# Patient Record
Sex: Male | Born: 1950 | Race: White | Hispanic: No | Marital: Single | Smoking: Current every day smoker
Health system: Southern US, Community
[De-identification: ages and names within clinical notes are randomized; demographics above are authoritative.]

## PROBLEM LIST (undated history)

## (undated) DIAGNOSIS — I10 Essential (primary) hypertension: Secondary | ICD-10-CM

## (undated) DIAGNOSIS — F99 Mental disorder, not otherwise specified: Secondary | ICD-10-CM

## (undated) DIAGNOSIS — I739 Peripheral vascular disease, unspecified: Secondary | ICD-10-CM

## (undated) DIAGNOSIS — Q909 Down syndrome, unspecified: Secondary | ICD-10-CM

## (undated) DIAGNOSIS — E039 Hypothyroidism, unspecified: Secondary | ICD-10-CM

## (undated) DIAGNOSIS — T783XXA Angioneurotic edema, initial encounter: Secondary | ICD-10-CM

## (undated) DIAGNOSIS — R0602 Shortness of breath: Secondary | ICD-10-CM

## (undated) HISTORY — PX: CHOLECYSTECTOMY: SHX55

---

## 2004-07-17 ENCOUNTER — Emergency Department (HOSPITAL_COMMUNITY): Admission: EM | Admit: 2004-07-17 | Discharge: 2004-07-17 | Payer: Self-pay | Admitting: Emergency Medicine

## 2004-07-20 ENCOUNTER — Inpatient Hospital Stay (HOSPITAL_COMMUNITY): Admission: EM | Admit: 2004-07-20 | Discharge: 2004-07-24 | Payer: Self-pay | Admitting: Emergency Medicine

## 2006-11-16 ENCOUNTER — Encounter: Payer: Self-pay | Admitting: Internal Medicine

## 2006-11-16 ENCOUNTER — Ambulatory Visit (HOSPITAL_COMMUNITY): Admission: RE | Admit: 2006-11-16 | Discharge: 2006-11-16 | Payer: Self-pay | Admitting: Internal Medicine

## 2008-10-16 ENCOUNTER — Inpatient Hospital Stay (HOSPITAL_COMMUNITY): Admission: EM | Admit: 2008-10-16 | Discharge: 2008-10-19 | Payer: Self-pay | Admitting: Emergency Medicine

## 2008-11-12 ENCOUNTER — Ambulatory Visit (HOSPITAL_BASED_OUTPATIENT_CLINIC_OR_DEPARTMENT_OTHER): Admission: RE | Admit: 2008-11-12 | Discharge: 2008-11-12 | Payer: Self-pay | Admitting: Urology

## 2008-11-12 ENCOUNTER — Encounter: Payer: Self-pay | Admitting: Urology

## 2009-07-30 ENCOUNTER — Encounter: Admission: RE | Admit: 2009-07-30 | Discharge: 2009-07-30 | Payer: Self-pay | Admitting: Internal Medicine

## 2009-07-31 ENCOUNTER — Encounter: Admission: RE | Admit: 2009-07-31 | Discharge: 2009-07-31 | Payer: Self-pay | Admitting: Internal Medicine

## 2009-08-07 ENCOUNTER — Inpatient Hospital Stay (HOSPITAL_COMMUNITY): Admission: AD | Admit: 2009-08-07 | Discharge: 2009-08-18 | Payer: Self-pay | Admitting: General Surgery

## 2009-08-07 ENCOUNTER — Encounter: Payer: Self-pay | Admitting: Internal Medicine

## 2009-08-07 ENCOUNTER — Ambulatory Visit: Payer: Self-pay | Admitting: Internal Medicine

## 2009-08-16 ENCOUNTER — Encounter (INDEPENDENT_AMBULATORY_CARE_PROVIDER_SITE_OTHER): Payer: Self-pay | Admitting: General Surgery

## 2009-10-17 ENCOUNTER — Encounter: Payer: Self-pay | Admitting: Internal Medicine

## 2009-10-23 ENCOUNTER — Telehealth: Payer: Self-pay | Admitting: Internal Medicine

## 2010-02-12 ENCOUNTER — Encounter: Payer: Self-pay | Admitting: Internal Medicine

## 2010-03-26 ENCOUNTER — Ambulatory Visit: Payer: Self-pay | Admitting: Internal Medicine

## 2010-03-26 DIAGNOSIS — F79 Unspecified intellectual disabilities: Secondary | ICD-10-CM | POA: Insufficient documentation

## 2010-03-26 DIAGNOSIS — D3A010 Benign carcinoid tumor of the duodenum: Secondary | ICD-10-CM

## 2010-04-06 ENCOUNTER — Telehealth (INDEPENDENT_AMBULATORY_CARE_PROVIDER_SITE_OTHER): Payer: Self-pay | Admitting: *Deleted

## 2010-04-06 ENCOUNTER — Encounter (INDEPENDENT_AMBULATORY_CARE_PROVIDER_SITE_OTHER): Payer: Self-pay | Admitting: *Deleted

## 2010-04-23 ENCOUNTER — Ambulatory Visit (HOSPITAL_COMMUNITY): Admission: RE | Admit: 2010-04-23 | Discharge: 2010-04-23 | Payer: Self-pay | Admitting: Gastroenterology

## 2010-04-23 ENCOUNTER — Ambulatory Visit: Payer: Self-pay | Admitting: Gastroenterology

## 2010-12-03 NOTE — Procedures (Signed)
Summary: Recall / [facility]  Recall / [facility]   Imported By: Lennie Odor 04/02/2010 17:10:11  _____________________________________________________________________  External Attachment:    Type:   Image     Comment:   External Document

## 2010-12-03 NOTE — Progress Notes (Signed)
Summary: EUS  Phone Note Outgoing Call Call back at [facility] Phone [phone]   Call placed by: Chales Abrahams CMA Duncan Dull),  April 06, 2010 11:11 AM Summary of Call: pt scheduled for EUS 04/23/10  reviewed  meds and instructed  pt.  Father will call if he has any questions after he recieves the information in the mail. Initial call taken by: Chales Abrahams CMA (AAMA),  April 06, 2010 11:12 AM

## 2010-12-03 NOTE — Assessment & Plan Note (Signed)
Summary: DUODENAL CARCINOID   History of Present Illness Visit Type: Initial Consult Primary GI MD: Stan Head MD [facility] Primary Provider: Lucky Cowboy, MD Requesting Provider: Lucky Cowboy, MD Chief Complaint: duodenal carcinoid History of Present Illness:   Patient unable to rely any complaints. Father provides history and there is no diarrhea or flushing. He is gaining weight as he is eating more since chlecystectomy.   GI Review of Systems      Denies abdominal pain, acid reflux, belching, bloating, chest pain, dysphagia with liquids, dysphagia with solids, heartburn, loss of appetite, nausea, vomiting, vomiting blood, weight loss, and  weight gain.        Denies anal fissure, black tarry stools, change in bowel habit, constipation, diarrhea, diverticulosis, fecal incontinence, heme positive stool, hemorrhoids, irritable bowel syndrome, jaundice, light color stool, liver problems, rectal bleeding, and  rectal pain. Preventive Screening-Counseling & Management  Alcohol-Tobacco     Smoking Status: current      Drug Use:  no.      ERCP  Procedure date:  08/07/2009  Findings:      1) Choledocholithiasis and dilated extrahepatic bile ducts. Stone removed. 2) Raised duodenal bulb lesion, with erosions. Biopsies taken. CARCINOID 3) Otherwise normal endoscopic survey, pancreatogram intentionally not obtained.  MISC. Report  Procedure date:  08/14/2009  Findings:      NUCLEAR MEDICINE OCTREOTIDE (SOMATOSTATIN-RECEPTOR) SCAN with SPECT    Technique:  Following intravenous administration of   radiopharmaceutical, whole body images of the head, neck, trunk,   and extremities were obtained on subsequent days. Whole body planar   images were obtained  at 24 and 48 hours. SPECT imaging was   performed at 48 hours.    Radiopharmaceutical: 6.1 mCi In-111 Octreotide    Comparison: CT abdomen 07/31/2009    Findings: Two small foci of  radiotracer accumulation within  the   central abdomen persist on 24 and 48 hours consistent with   neuroendocrine tumor.  The more superior focus likely corresponds   to a periportal lymph node seen on comparison CT.  The more   inferior focus is  midline to just left of midline a in the region   of the uncinate process of the pancreas and third portion duodenum.   There is no clearly enlarged  lymph node within this region.  No   convincing evidence of hepatic metastasis.    No evidence of distant metastasis within the chest or pelvis on   whole body images.    IMPRESSION:    1.  Two foci of neuroendocrine tumor within the central abdomen as   described above.   2.  No evidence of distant metastasis.   CT Abdomen/Pelvis  Procedure date:  07/31/2009  Findings:      ABD CT   1.  Cholelithiasis on ultrasound not appreciated on CT.   Gallbladder wall thickening is consistent with cholecystitis.  In   addition, dilated intrahepatic and extrahepatic bile ducts to the   level of the pancreatic head may represent common bile duct occult   choledocholithiasis or stricture.  No other intrinsic or extrinsic   obstruction lesion is seen.   2.  Probable slight reactive adenitis upper abdomen.   3.  Borderline fatty liver.   4.  Slight right gynecomastia (left breast not included for   assessment).   5.  Tiny (left greater than right) renal cortical cysts.   6.  Otherwise, negative.  PELVIC CT    1.  Increased number small  sized bilateral inguinal and external   iliac lymph nodes consistent with benign reactive adenitis.   2.  Slight degenerative changes L5-S1.   3.  Otherwise, negative.   Current Medications (verified): 1)  Atenolol 100 Mg Tabs (Atenolol) .Marland Kitchen.. 1 By Mouth Once Daily 2)  Enalapril Maleate 20 Mg Tabs (Enalapril Maleate) .Marland Kitchen.. 1 By Mouth Once Daily 3)  Lasix 40 Mg Tabs (Furosemide) .Marland Kitchen.. 1 By Mouth Once Daily 4)  Potassium Chloride Cr 10 Meq Cr-Caps (Potassium Chloride) .... Once Daily  Allergies  (verified): No Known Drug Allergies  Past History:  Past Medical History: Hypertension mental retardation exogenous obesity venous stasis dermatitis lower extremities Gout obstructive sleep apnea carcinoid tumor, benign duodenum  Past Surgical History: Cholecystectomy  Family History: No FH of Colon Cancer:  Social History: Occupation: Disabled Patient currently smokes.  Alcohol Use - no Daily Caffeine Use Illicit Drug Use - no Smoking Status:  current Drug Use:  no  Review of Systems       The patient complains of fatigue, shortness of breath, sleeping problems, and swelling of feet/legs.  The patient denies allergy/sinus, anemia, anxiety-new, arthritis/joint pain, back pain, blood in urine, breast changes/lumps, change in vision, confusion, cough, coughing up blood, depression-new, fainting, fever, headaches-new, hearing problems, heart murmur, heart rhythm changes, itching, menstrual pain, muscle pains/cramps, night sweats, nosebleeds, pregnancy symptoms, skin rash, sore throat, swollen lymph glands, thirst - excessive , urination - excessive , urination changes/pain, urine leakage, vision changes, and voice change.    Vital Signs:  Patient profile:   60 year old male Height:      70 inches Weight:      317 pounds BMI:     45.65 Pulse rate:   72 / minute Pulse rhythm:   regular BP sitting:   150 / 84  (left arm) Cuff size:   large  Vitals Entered By: June McMurray CMA Duncan Dull) (Mar 26, 2010 10:03 AM)  Physical Exam  General:  obese, NAD Eyes:  anicteric Lungs:  Clear throughout to auscultation. Heart:  Regular rate and rhythm; no murmurs, rubs,  or bruits. 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  Octreotide scan sowed duodenal lesion and probable lymph node +.  Calling this benign for now but if in lymph node may be more aggressive. He is poor operative candidate for many reasons as it appears that pancreaticoduodenectomy could be needed. He has recovered from cholecystectomy and needs reassessment of this tumor. I think EUS makes the most sense and will confer with Dr. Christella Hartigan. I explained this to the patient and his father, believe patient has limited, if any understanding of the significance.  Problem # 2:  MENTAL RETARDATION (ICD-319) Assessment: Comment Only  Patient Instructions: 1)  Please continue current medications.  2)  We will contact you and your dad about the next test, which is likely to be an endoscopic ultrasound of the upper GI tract. perfomed by Dr. Christella Hartigan.  3)  Copy sent to : Lucky Cowboy, MD and Consuello Bossier, MD 4)  The medication list was reviewed and reconciled.  All changed / newly prescribed medications were explained.  A complete medication list was provided to the patient / caregiver.  Appended Document: DUODENAL CARCINOID reviewed CT, nuc scan images.  The periportal focus on octreotide is probably the  11mm periportal LN.  should be accessable by EUS.  patty, can you arrange for upper eus, radial, +/-linear, with propofol, next available EUS/propofol time.  dx: carcinoid tumor, suspicious adenopathy.  Appended Document: DUODENAL CARCINOID be sure to speak to the father Thanks

## 2010-12-03 NOTE — Letter (Signed)
Summary: EGD Instructions  [facility]  [address] [place], [place] [address]   Phone: [phone]  Fax: [phone]       Eduardo Herman    1950-12-28    MRN: [id number]       Procedure Day /Date: 04/23/10 THURS     Arrival Time: 830 am     Procedure Time:1030 am     Location of Procedure:                     [facility] ( Outpatient Registration)    PREPARATION FOR ENDOSCOPY   On 04/23/10  THE DAY OF THE PROCEDURE:  1.   Nothing to eat or drink is allowed after midnight the night before your procedure.   MEDICATION INSTRUCTIONS  Unless otherwise instructed, you should take regular prescription medications with a small sip of water as early as possible the morning of your procedure.              OTHER INSTRUCTIONS  You will need a responsible adult at least 62 years of age to accompany you and drive you home.   This person must remain in the waiting room during your procedure.  Wear loose fitting clothing that is easily removed.  Leave jewelry and other valuables at home.  However, you may wish to bring a book to read or an iPod/MP3 player to listen to music as you wait for your procedure to start.  Remove all body piercing jewelry and leave at home.  Total time from sign-in until discharge is approximately 2-3 hours.  You should go home directly after your procedure and rest.  You can resume normal activities the day after your procedure.  The day of your procedure you should not:   Drive   Make legal decisions   Operate machinery   Drink alcohol   Return to work  You will receive specific instructions about eating, activities and medications before you leave.    The above instructions have been reviewed and explained to me by   Chales Abrahams CMA Duncan Dull)  April 06, 2010 11:10 AM     I fully understand and can verbalize these instructions over the phone mailed to home Date 04/06/10

## 2010-12-03 NOTE — Procedures (Signed)
Summary: Endoscopic Ultrasound  Patient: Eduardo Herman Note: All result statuses are Final unless otherwise noted.  Tests: (1) Endoscopic Ultrasound (EUS)  EUS Endoscopic Ultrasound                             DONE     [facility]     [address] [place], [place]  [address]           ENDOSCOPIC ULTRASOUND PROCEDURE REPORT     PATIENT:  Eduardo Herman, Eduardo Herman  MR#:  [id number]     BIRTHDATE:  01-27-1951  GENDER:  male     ENDOSCOPIST:  Rachael Fee, MD     REFERRED BY:  Iva Boop, M.D., [facility]     PROCEDURE DATE:  04/23/2010     PROCEDURE:  Upper EUS w/FNA     ASA CLASS:  Class III     INDICATIONS:  incidentally noted duodenal bulb carcinoid (during     ERCP 08/2009 Dr. Leone Payor), nuc med scan shows uptake in duodenum     as well as nearby periportal lymphnode     MEDICATIONS:   MAC sedation, administered by CRNA     DESCRIPTION OF PROCEDURE:   After the risks benefits and     alternatives of the procedure were  explained, informed consent     was obtained. The patient was then placed in the left, lateral,     decubitus postion and IV sedation was administered. Throughout the     procedure, the patient's blood pressure, pulse and oxygen     saturations were monitored continuously.  Under direct     visualization, the  endoscope was introduced through the mouth and     advanced to the duodenum.  Water was used as necessary to provide     an acoustic interface.  Upon completion of the imaging, water was     removed and the patient was sent to the recovery room in     satisfactory condition.     <<PROCEDUREIMAGES>>     Endoscopic findings:     1. Normal esopahgus     2. Normal stomach     3. Poorly visualized (using radial and linear echoendoscopes)     nodular lesion in proximal duodenal bulb that is likely the     duodenal carcinoid biopsied previously     EUS findings:     1. 2cm, irregularly shapred, heterogeneous periportal lymphnode     was clear.  This was not  malignant appearing.  The lesion was     sampled with 2 passes of a 25guage BS EUS FNA needle using color     doppler to avoid signficant blood vessels.     2. There were no other visible periportal lymphnodes.     3. Limited views of pancreas, liver, portal, splenic vessels were     all normal.     4. Gallbladder surgically absent.     Impression:     2cm, reactive appearing periportal lymphnode was sampled with EUS     FNA.  Await final cytology results.  Standard of care for small     bowel carcinoid is surgical resection, lymphnode resection (given     the potential for metastatic adenopathy even at small sizes).  He     has signficant comorbities and so clinical observation is not     unreasonable.  ______________________________     Rachael Fee, MD           cc: Lucky Cowboy, MD; Thornton Dales, MD           n.     eSIGNED:   Rachael Fee at 04/23/2010 11:47 AM           Reynolds, Fayrene Fearing, [id number]  Note: An exclamation mark (!) indicates a result that was not dispersed into the flowsheet. Document Creation Date: 04/23/2010 11:49 AM _______________________________________________________________________  (1) Order result status: Final Collection or observation date-time: 04/23/2010 11:37 Requested date-time:  Receipt date-time:  Reported date-time:  Referring Physician:   Ordering Physician: Rob Bunting [phone]) Specimen Source:  Source: Launa Grill Order Number: [id number] Lab site:   Appended Document: Endoscopic Ultrasound    EUS  Procedure date:  04/23/2010  Findings:      RESULTS:  Endoscopic findings:   1. Normal esopahgus   2. Normal stomach   3. Poorly visualized (using radial and linear echoendoscopes)   nodular lesion in proximal duodenal bulb that is likely the   duodenal carcinoid biopsied previously   EUS findings:   1. 2cm, irregularly shapred, heterogeneous periportal lymphnode   was clear. This was not malignant  appearing. The lesion was   sampled with 2 passes of a 25guage BS EUS FNA needle using color   doppler to avoid signficant blood vessels.   2. There were no other visible periportal lymphnodes.   3. Limited views of pancreas, liver, portal, splenic vessels were   all normal.   4. Gallbladder surgically absent.  ***Microscopic Examination and Diagnosis***   STATEMENT Of SPECIMEN ADEQUACY:   Satisfactory for evaluation.    INTERPRETATION(S):   FINE NEEDLE ASPIRATION, ENDOSCOPIC, PERIPORTAL LYMPH NODE:   Mixed lymphoid population.   No epithelial tumor identified.

## 2010-12-03 NOTE — Letter (Signed)
Summary: Marinus Maw MD  Marinus Maw MD   Imported By: [place] 04/03/2010 08:12:41  _____________________________________________________________________  External Attachment:    Type:   Image     Comment:   External Document

## 2011-02-04 LAB — COMPREHENSIVE METABOLIC PANEL
ALT: 102 U/L — ABNORMAL HIGH (ref 0–53)
ALT: 78 U/L — ABNORMAL HIGH (ref 0–53)
ALT: 158 U/L — ABNORMAL HIGH (ref 0–53)
ALT: 96 U/L — ABNORMAL HIGH (ref 0–53)
AST: 47 U/L — ABNORMAL HIGH (ref 0–37)
AST: 74 U/L — ABNORMAL HIGH (ref 0–37)
AST: 34 U/L (ref 0–37)
AST: 77 U/L — ABNORMAL HIGH (ref 0–37)
Albumin: 2.8 g/dL — ABNORMAL LOW (ref 3.5–5.2)
Albumin: 2.6 g/dL — ABNORMAL LOW (ref 3.5–5.2)
Albumin: 2.7 g/dL — ABNORMAL LOW (ref 3.5–5.2)
Albumin: 2.7 g/dL — ABNORMAL LOW (ref 3.5–5.2)
Albumin: 2.7 g/dL — ABNORMAL LOW (ref 3.5–5.2)
Albumin: 2.8 g/dL — ABNORMAL LOW (ref 3.5–5.2)
Alkaline Phosphatase: 208 U/L — ABNORMAL HIGH (ref 39–117)
Alkaline Phosphatase: 334 U/L — ABNORMAL HIGH (ref 39–117)
Alkaline Phosphatase: 384 U/L — ABNORMAL HIGH (ref 39–117)
Alkaline Phosphatase: 612 U/L — ABNORMAL HIGH (ref 39–117)
Alkaline Phosphatase: 635 U/L — ABNORMAL HIGH (ref 39–117)
BUN: 11 mg/dL (ref 6–23)
BUN: 14 mg/dL (ref 6–23)
BUN: 15 mg/dL (ref 6–23)
CO2: 29 mEq/L (ref 19–32)
CO2: 25 mEq/L (ref 19–32)
Calcium: 8.8 mg/dL (ref 8.4–10.5)
Calcium: 8.6 mg/dL (ref 8.4–10.5)
Chloride: 100 mEq/L (ref 96–112)
Chloride: 102 mEq/L (ref 96–112)
Chloride: 103 mEq/L (ref 96–112)
Chloride: 105 mEq/L (ref 96–112)
Chloride: 107 mEq/L (ref 96–112)
Creatinine, Ser: 1.18 mg/dL (ref 0.4–1.5)
Creatinine, Ser: 0.97 mg/dL (ref 0.4–1.5)
Creatinine, Ser: 0.97 mg/dL (ref 0.4–1.5)
GFR calc Af Amer: >60 mL/min (ref >60)
GFR calc Af Amer: >60 mL/min (ref >60)
GFR calc Af Amer: >60 mL/min (ref >60)
GFR calc non Af Amer: >60 mL/min (ref >60)
GFR calc non Af Amer: >60 mL/min (ref >60)
GFR calc non Af Amer: >60 mL/min (ref >60)
Glucose, Bld: 121 mg/dL — ABNORMAL HIGH (ref 70–99)
Glucose, Bld: 123 mg/dL — ABNORMAL HIGH (ref 70–99)
Potassium: 3.8 mEq/L (ref 3.5–5.1)
Potassium: 2.9 mEq/L — ABNORMAL LOW (ref 3.5–5.1)
Potassium: 3.3 mEq/L — ABNORMAL LOW (ref 3.5–5.1)
Potassium: 3.6 mEq/L (ref 3.5–5.1)
Potassium: 3.6 mEq/L (ref 3.5–5.1)
Sodium: 137 mEq/L (ref 135–145)
Sodium: 140 mEq/L (ref 135–145)
Sodium: 137 mEq/L (ref 135–145)
Sodium: 138 mEq/L (ref 135–145)
Total Bilirubin: 4.3 mg/dL — ABNORMAL HIGH (ref 0.3–1.2)
Total Bilirubin: 4.8 mg/dL — ABNORMAL HIGH (ref 0.3–1.2)
Total Bilirubin: 15.8 mg/dL — ABNORMAL HIGH (ref 0.3–1.2)
Total Bilirubin: 18.5 mg/dL (ref 0.3–1.2)
Total Bilirubin: 5.9 mg/dL — ABNORMAL HIGH (ref 0.3–1.2)
Total Protein: 6.9 g/dL (ref 6.0–8.3)
Total Protein: 6.7 g/dL (ref 6.0–8.3)
Total Protein: 6.8 g/dL (ref 6.0–8.3)
Total Protein: 7.2 g/dL (ref 6.0–8.3)

## 2011-02-04 LAB — CBC
HCT: 42.2 % (ref 39.0–52.0)
HCT: 42.8 % (ref 39.0–52.0)
Hemoglobin: 15 g/dL (ref 13.0–17.0)
Hemoglobin: 14.2 g/dL (ref 13.0–17.0)
Hemoglobin: 14.7 g/dL (ref 13.0–17.0)
Hemoglobin: 14.9 g/dL (ref 13.0–17.0)
MCHC: 35 g/dL (ref 30.0–36.0)
MCHC: 34.3 g/dL (ref 30.0–36.0)
MCV: 82.6 fL (ref 78.0–100.0)
MCV: 81.8 fL (ref 78.0–100.0)
MCV: 82.6 fL (ref 78.0–100.0)
Platelets: 209 10*3/uL (ref 150–400)
Platelets: 211 10*3/uL (ref 150–400)
Platelets: 221 10*3/uL (ref 150–400)
Platelets: 225 10*3/uL (ref 150–400)
RBC: 5.19 MIL/uL (ref 4.22–5.81)
RBC: 5.11 MIL/uL (ref 4.22–5.81)
RDW: 17.9 % — ABNORMAL HIGH (ref 11.5–15.5)
RDW: 18.1 % — ABNORMAL HIGH (ref 11.5–15.5)
WBC: 12.8 10*3/uL — ABNORMAL HIGH (ref 4.0–10.5)
WBC: 8.7 10*3/uL (ref 4.0–10.5)
WBC: 8.8 10*3/uL (ref 4.0–10.5)

## 2011-02-04 LAB — GLUCOSE, CAPILLARY
Glucose-Capillary: 128 mg/dL — ABNORMAL HIGH (ref 70–99)
Glucose-Capillary: 129 mg/dL — ABNORMAL HIGH (ref 70–99)
Glucose-Capillary: 100 mg/dL — ABNORMAL HIGH (ref 70–99)
Glucose-Capillary: 102 mg/dL — ABNORMAL HIGH (ref 70–99)
Glucose-Capillary: 103 mg/dL — ABNORMAL HIGH (ref 70–99)
Glucose-Capillary: 104 mg/dL — ABNORMAL HIGH (ref 70–99)
Glucose-Capillary: 108 mg/dL — ABNORMAL HIGH (ref 70–99)
Glucose-Capillary: 110 mg/dL — ABNORMAL HIGH (ref 70–99)
Glucose-Capillary: 113 mg/dL — ABNORMAL HIGH (ref 70–99)
Glucose-Capillary: 124 mg/dL — ABNORMAL HIGH (ref 70–99)
Glucose-Capillary: 133 mg/dL — ABNORMAL HIGH (ref 70–99)
Glucose-Capillary: 165 mg/dL — ABNORMAL HIGH (ref 70–99)
Glucose-Capillary: 190 mg/dL — ABNORMAL HIGH (ref 70–99)
Glucose-Capillary: 92 mg/dL (ref 70–99)
Glucose-Capillary: 99 mg/dL (ref 70–99)

## 2011-02-04 LAB — PROTIME-INR: INR: 1.04 (ref 0.00–1.49)

## 2011-02-04 LAB — POTASSIUM: Potassium: 3.3 mEq/L — ABNORMAL LOW (ref 3.5–5.1)

## 2011-02-15 LAB — POCT I-STAT 4, (NA,K, GLUC, HGB,HCT)
Glucose, Bld: 115 mg/dL — ABNORMAL HIGH (ref 70–99)
HCT: 50 % (ref 39.0–52.0)
Potassium: 3.5 mEq/L (ref 3.5–5.1)

## 2011-03-16 NOTE — Op Note (Signed)
NAME:  Eduardo, Herman NO.:  [id number]   MEDICAL RECORD NO.:  [id number]          PATIENT TYPE:  AMB   LOCATION:  [facility]                         FACILITY:  [facility]   PHYSICIAN:  Excell Seltzer. Annabell Howells, M.D.    DATE OF BIRTH:  1950-11-12   DATE OF PROCEDURE:  DATE OF DISCHARGE:                               OPERATIVE REPORT   PROCEDURE:  Right scrotal exploration with right scrotal orchiectomy and  dorsal slit.   PREOPERATIVE DIAGNOSIS:  Right hydrocele.   POSTOPERATIVE DIAGNOSIS:  Right hydrocele with severe inflammatory  changes and inflammatory mass and severe phimosis.   ANESTHESIA:  General.   SPECIMEN:  Right testicle and tunics.   ESTIMATED BLOOD LOSS:  Minimal.   COMPLICATIONS:  None.   DRAIN:  0.25 inch Penrose drain.   INDICATIONS:  [name] is a 60 year old mentally retarded white male  with longstanding right scrotal swelling and urinary incontinence.  On  ultrasound  evaluation and physical exam he was found to have a large  right hydrocele which was giving him problems with discomfort and  urinary control.  It was felt that hydrocelectomy was indicated.   PROCEDURE:  The patient is given a gram of Ancef.  He is taken operating  room where general anesthetic was induced.  He was placed in the supine  position.  His scrotum was shaved.  He was prepped with Betadine  solution.  He was draped in the usual sterile fashion.  A right anterior  oblique scrotal incision was made.  The hydrocele sac was densely  adherent to the overlying dartos but was dissected out using blunt and  Bovie dissection.  Once the hydrocele sac had been exposed. the sac was  opened using a scalpel and 450 mL of clear fluid were removed.  The sac  was opened more completely and it was found have a very thick wall and  the superior aspect of the sac was a mass effect that is likely  inflammatory but neoplasm could not be ruled out.  It was felt, based on  the findings of the sac and  the patient's general condition, that  orchiectomy would be the most appropriate approach to therapy.  Also,  once at  surgery he was found to severe phimosis and it was felt the  dorsal slitting was indicated.  I spoke to the patient's father and he  was agreeable to these additions.  Once that decision was made, the vas  was clipped and divided, the vascular pedicle was dissected out, clipped  and divided, the vas was ligated with an 0-Vicryl tie.  The pedicle was  doubly ligated using a #1 Vicryl stick tie in a 0-Vicryl free tie.  A  0.25 inch Penrose drain was placed through separate stab wound in the  inferior portion of the scrotum.  The dartos was closed with a running 3-  0 chromic, the skin closed with a running vertical mattress 3-0 chromic.  The drain was secured with a towel clip at this point.  We then turned  our attention to the penis.  A dorsal slit was performed sufficient to  allow exposure of the glans and the skin edges were then reapproximated  with a running 4-0 chromic suture.  Once the dorsal slit had been  completed, a penile block was performed with 7 cc of 0.25 percent  Marcaine without epinephrine and the incision  was injected with the remaining 3%.  Neosporin was applied to the penile  incision.  The towel clip was removed of 4x4s were applied to the  scrotal incision followed by fluff Kerlix and a scrotal support.  The  patient's anesthetic was reversed.  He was taken to recovery room in  stable condition.  There were no complications.      Excell Seltzer. Annabell Howells, M.D.  Electronically Signed     JJW/MEDQ  D:  11/12/2008  T:  11/12/2008  Job:  [id number]

## 2011-03-16 NOTE — H&P (Signed)
NAME:  Eduardo Herman, Eduardo Herman NO.:  [id number]   MEDICAL RECORD NO.:  [id number]          PATIENT TYPE:  INP   LOCATION:  4729                         FACILITY:  [facility]   PHYSICIAN:  Lucita Ferrara, MD         DATE OF BIRTH:  1951-06-03   DATE OF ADMISSION:  10/16/2008  DATE OF DISCHARGE:                              HISTORY & PHYSICAL   PRIMARY CARE PHYSICIAN:  Unassigned.   The patient is a 60 year old who presents with hypoglycemia.  The  patient has a history of mental retardation.  Was found at home to have  alterations in mental status secondary to hypoglycemia.  The patient  also has some urinary incontinence, frequency.  There are no fevers or  chills noted.  The patient denies any nausea, vomiting, focal  neurological deficits.   PAST MEDICAL HISTORY:  Significant for:  1. Hypertension.  2. Diabetes.  3. Mental retardation.  4. Hypothyroidism.  5. Sleep apnea noncompliant with CPAP.  6. History of ileus.  7. Chronic venous ulcerations, superinfection history of.   SOCIAL HISTORY:  The patient denies drugs, alcohol or tobacco.  Lives  with his parents.   ALLERGIES:  No known drug allergies.   MEDICATIONS AT HOME:  1. Atenolol.  2. Enalapril.  3. Viagra.  4. Lasix.  5. Levothyroxine.  6. Potassium.   REVIEW OF SYSTEMS:  As per HPI; otherwise, negative.   PHYSICAL EXAMINATION:  GENERAL:  She is in no acute distress.  Blood pressure is 176/79, pulse 65, respirations 18, temperature 97.9,  pulse ox 97% on room air.  HEENT:  Normocephalic, atraumatic.  Sclerae anicteric.  NECK:  Supple.  No JVD, no carotid bruits.  PERLA.  Extraocular muscles intact.  CARDIOVASCULAR:  S1, S2, regular rate and rhythm.  No murmurs, rubs or  clicks.  LUNGS:  Clear to auscultation bilaterally.  No rhonchi, rales or  wheezes.  ABDOMEN:  Soft, nontender, nondistended.  Positive bowel sounds.  NEUROLOGICAL:  Patient is alert and oriented x3.  Cranial nerves II-XII  are  grossly intact.   LABORATORY DATA:  CBC:  White count 15 hemoglobin 15.1, hematocrit 45.3,  platelets 282.  Glucose level 58.  Urinalysis shows everything to be  essentially negative.  Chest x-ray not performed.   ASSESSMENT/PLAN:  The patient is a 60 year old with:   1. Hypoglycemia.  2. Leukocytosis.  3. History of mental retardation.  4. Hypertension.   DISCUSSION/PLAN:  The patient will be admitted to the medical telemetry  unit.  Blood, urine cultures will be initiated.  For clarification will  get a portable chest x-ray.  The patient will be started on D5 1/2  normal saline.  Blood sugars will be maintained.  TSH.  Continue  levothyroxine.  Hypertension with moderate control.  Will try to obtain  better control.  Continue home medications.  DVT and GI prophylaxis  routine.  The rest of the plans are dependent on his progress.      Lucita Ferrara, MD  Electronically Signed     RR/MEDQ  D:  10/17/2008  T:  10/17/2008  Job:  [id number]

## 2011-03-16 NOTE — Discharge Summary (Signed)
NAME:  Eduardo Herman, READ NO.:  [id number]   MEDICAL RECORD NO.:  [id number]          PATIENT TYPE:  INP   LOCATION:  5511                         FACILITY:  [facility]   PHYSICIAN:  Marcellus Scott, MD     DATE OF BIRTH:  [birth year]   DATE OF ADMISSION:  10/16/2008  DATE OF DISCHARGE:  10/19/2008                               DISCHARGE SUMMARY   PRIMARY CARE DOCTOR:  Windle Guard, MD.   DISCHARGE DIAGNOSES:  1. Diabetes mellitus, type 2, with hypoglycemia.  Hypoglycemic      episodes resolved.  2. Large right hydrocele.  Outpatient followup with [facility].  3. Hypokalemia - repleted.  4. Hypertension.  5. Leukocytosis - resolved.  6. Mental retardation.  7. Hypothyroidism.  8. Sleep apnea noncompliant with continuous positive airway pressure      (CPAP).   DISCHARGE MEDICATIONS:  1. Atenolol 100 mg p.o. daily.  2. Enalapril 20 mg p.o. daily.  3. Lasix 40 mg p.o. daily.  4. Potassium chloride 20 mEq p.o. daily.  5. Levothyroxine 300 mcg p.o. on Mondays, Wednesdays, and Fridays.   MEDICATIONS HELD:  Glyburide.   PROCEDURES:  1. Scrotal ultrasound on the December 17th.  Impression:  There is a      large right hydrocele and a small left hydrocele.  Each testes is      located in a peripheral position and is likely flattened suggesting      that the hydrocele fluid may be under pressure.  No testicular mass      or ischemia or hyperemia was evident.  The epididymitis cannot be      identified on either side.  2. Chest x-ray on December 17th:  Improving atelectasis in both lung      bases.  3. Chest x-ray on December 17th:  Again, a cardiac enlargement and      central vascular congestion.  No overt pulmonary edema below lung      volumes with vascular crowding and atelectasis.   PERTINENT LABORATORY DATA:  Basic metabolic panel today remarkable for  potassium 3.4, BUN 10, creatinine 0.81.  Blood cultures are negative to  date.  CBC is with hemoglobin  14.3, hematocrit 43.2, white blood cells  7.1, platelets 247, TSH 1.483.   CONSULTATIONS:  Urology, Excell Seltzer. Annabell Howells, MD.   HOSPITAL COURSE AND PATIENT DISPOSITION:  1. [name] is a 60 year old gentleman with history of mental      retardation, type 2 diabetes, hypertension, who resides with his      parents.  The patient was found at home with altered mental status      and hypoglycemia.  He was then brought to the hospital for further      evaluation and management.  Patient was admitted to the telemetry      bed.  He was started on D5 half-normal saline.  His blood sugars      were frequently monitored.  Unfortunately, he was also given a dose      of Glyburide that he takes at home on the morning on  December 17th.      Over the next 24 hours, his blood sugars improved.  He had an      episode of hypoglycemia on the midnight of December 17th.  The      Glyburide was held.  Since then, his CBGs were checked q.4 hourly,      and his D5 normal saline had been stopped.  Over the last 24 hours,      the patient has been euglycemic with sugars ranging from 93 to 128      mg/dL off medications.  The patient at this time will be discharged      home.  He is advised not to take his oral hypoglycemic agents until      he is seen by his primary medical doctor in the next 3 to 4 days.      His parents have been advised of this and they have to call the      primary MD's office on Monday for an appointment to be seen in the      next 3 to 4 days.  2. A large right hydrocele.  The patient did not complain of any pain,      but on physical exam he was found to have markedly enlarged scrotal      swelling, which was nodular and hard but nontender and questions      likely hyperemic.  An ultrasound was done with findings as above.      Urology was consulted.  Dr. Annabell Howells kindly saw him today and      indicates that he has a symptomatic large right hydrocele without      evidence of infection, and he  recommends elective right      hydrocelectomy.  Will have the patient's family call Dr. Annabell Howells as      an outpatient for elective hydrocelectomy.  3. Hypokalemia - replete.  4. Hypertension.  Patient's blood pressure has been ranging in the      150s to 160s mainly over 80s to 90s.  This would need tighter      control as an outpatient.  5. Leukocytosis probably stress related.  He had no clinical focus of      sepsis.  He was empirically started on Avelox, which has been      discontinued.  6. Mental retardation, which is probably at baseline.  Patient      actually is eager to return home.  He was actually dressed up and      ready to go yesterday but agreed to stay an additional day after      his father spoke with him and convinced him.  Today, again, before      I went into the room, patient was already dressed and ready to go      home.   Time spent: 30 mins.      Marcellus Scott, MD  Electronically Signed     AH/MEDQ  D:  10/19/2008  T:  10/19/2008  Job:  [id number]   cc:   Excell Seltzer. Annabell Howells, M.D.  Windle Guard, M.D.

## 2011-03-16 NOTE — Consult Note (Signed)
NAME:  Eduardo Herman, Eduardo Herman NO.:  [id number]   MEDICAL RECORD NO.:  [id number]          PATIENT TYPE:  INP   LOCATION:  5511                         FACILITY:  [facility]   PHYSICIAN:  Excell Seltzer. Annabell Howells, M.D.    DATE OF BIRTH:  03-16-1951   DATE OF CONSULTATION:  10/18/2008  DATE OF DISCHARGE:                                 CONSULTATION   REFERRING PHYSICIAN:  Lucita Ferrara, MD   CHIEF COMPLAINT:  Scrotal swelling.   HISTORY:  Eduardo Herman is a 60 year old mainly handicapped white male who was  admitted on December 16 for altered mental status secondary to  hypoglycemia.  He had a leukocytosis.  We are consulted today by Dr.  Flonnie Overman for scrotal swelling which has been present for quite some time  but the patient's father states it has been worse over the past month.  He has a history of chronic incontinence.  There has been no complaints  of fever, skin breakdown or purulent drainage.   PAST HISTORY:  Significant for no drug allergies.   ADMISSION MEDICATIONS:  Enalapril, atenolol, Viagra, Lasix,  levothyroxine and potassium.   MEDICAL HISTORY:  1. Hypertension.  2. Diabetes.  3. Mental retardation.  4. Hypothyroidism.  5. Sleep apnea.  6. Prior ileus.  7. History of chronic venous stasis.   SURGICAL HISTORY:  Unremarkable.   SOCIAL HISTORY:  The patient lives at home with his parents.  He denies  drugs or alcohol.   FAMILY HISTORY:  Unremarkable.   REVIEW OF SYSTEMS:  He has had some weight loss, recent sinus congestion  with a cold.  He is otherwise currently without complaints per full  review of systems.   EXAM:  His blood pressure is 150/82, heart rate 61, temperature 97.2,  respirations 20.  GENERAL:  He is a well-developed, well-nourished obese white male in no  acute distress.  HEAD AND FACE:  Normocephalic, atraumatic.  NECK:  Supple.  LUNGS:  Bilateral breath sounds which were diminished.  HEART:  Regular rate and rhythm.  ABDOMEN:  Soft, obese, nontender  without mass, splenomegaly or hernias.  GU:  Exam reveals scrotal swelling, the penis is buried due to the large  swelling on the right which is hard but nontender.  The left testicle is  readily palpable, normal in size and consistency without mass or  tenderness.  Left epididymis is unremarkable.  The right testicle is not  palpable within the large hydrocele.  RECTAL:  Perineum reveals no fluctuance.  EXTREMITIES:  Have full range of motion with some lower extremity edema.  Neurologically has no focal deficits.   ACCESSORY CLINICAL INFORMATION:  White count 7.1, hemoglobin 14.3,  platelet count 247,000, chemistries within normal limits with a  creatinine of 0.85, sugar is 85.  Urinalysis was dip negative, blood  culture was negative.  Scrotal ultrasound reveals a large right  hydrocele with a small left hydrocele, the right testis is somewhat  small and flattened, likely due to the pressure from the hydrocele.  No  mass lesions were noted.   IMPRESSION:  Symptomatic large right  hydrocele without evidence of  infection.   RECOMMENDATIONS:  At this point the best treatment would be elective  right hydrocelectomy when he recovers from his acute illness.  I will  speak with his family regarding that.      Excell Seltzer. Annabell Howells, M.D.  Electronically Signed     JJW/MEDQ  D:  10/18/2008  T:  10/19/2008  Job:  [id number]   cc:   Lucita Ferrara, MD  Email: [email or web address]

## 2011-03-19 NOTE — H&P (Signed)
NAME:  Eduardo Herman, Eduardo Herman NO.:  [id number]   MEDICAL RECORD NO.:  [id number]          PATIENT TYPE:  EMS   LOCATION:  ED                           FACILITY:  [facility]   PHYSICIAN:  Melissa L. Ladona Ridgel, MD  DATE OF BIRTH:  01-13-1951   DATE OF ADMISSION:  07/20/2004  DATE OF DISCHARGE:                                HISTORY & PHYSICAL   CHIEF COMPLAINT:  Leg ulcer.   PRIMARY CARE PHYSICIAN:  Windle Guard, M.D.   HISTORY OF PRESENT ILLNESS:  Patient is a 60 year old white male with an  element of mental retardation, who is cared for by his father.  The patient  is unable to give history.  His father has given the below information.  Patient denies any pain at this time but has been seen in the past couple of  weeks in the emergency room for lower extremity cellulitis and evaluated for  venous stasis.  Patient was placed in Unna boots and was returned to his  primary caregiver for followup.  He returns today for purulent drainage and  a frankly opened wound to his right lower extremity.   Review of systems is unable to be adequately obtained, as the patient is  unable to answer the questions.  His father, however, states that he has not  had any fevers or chills, he has just been a little bit ornery, as he has  not been able to go to work.   PAST MEDICAL HISTORY:  1.  Hypertension.  2.  Hypothyroidism.  3.  Probable sleep apnea, for which the patient has refused to wear a CPAP.   PAST SURGICAL HISTORY:  None.   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  He does smoke.  He does not drink.   FAMILY HISTORY:  His father is living and has hypertension and cholesterol.  Mom is deceased secondary to stroke and complicating post-stroke factors.   ALLERGIES:  No known drug allergies.   MEDICATIONS:  1.  Lisinopril 10 mg q.d.  2.  Potassium 20 mg 2 tablets q.d.  3.  Levothyroxine 150 mcg p.o. q.d.  4.   Triamterene/hydrochlorothiazide combination 37.5/25 mg p.o. q.d.  5.  Atenolol 100 mg p.o. q.d.   PHYSICAL EXAMINATION:  VITAL SIGNS:  Temperature 98.2, blood pressure  118/81, pulse 84, respirations 20, 91%.  GENERAL:  He is in no acute distress.  He rouses but only with aggressive  stimulation.  This is likely secondary to some voluntary behaviors on the  patient's behalf secondary to his mental incapacity.  He will answer some  questions but is unable to answer other questions.  HEENT:  Normocephalic and atraumatic.  Pupils are equal, round and reactive  to light.  Extraocular muscles are intact.  Mucous membranes are moist.  He  has poor oral hygiene.  NECK:  Supple.  There is no JVD or lymph nodes.  LUNGS:  Clear to auscultation.  There are no rales, rhonchi or wheezes.  Breath sounds are decreased bilaterally.  CARDIOVASCULAR:  Regular  rate and rhythm.  Positive S1 and S2.  No S3 or S4  are noted.  Heart sounds are very distant.  There are no murmurs, rubs or  gallops.  ABDOMEN:  Soft, nontender, nondistended with positive bowel sounds.  He is  very obese.  EXTREMITIES:  Show right shin ulceration measuring about 3 x 5 inches with  obvious necrosis and pus.  He has +1 pulses.  NEUROLOGIC:  The patient is very simple.  He is unable to give the year or  the month but can give his city.  He cycles between speaking to you and has  some shy behavior.  Power appears to be 5/5.  Deep tendon reflexes are 2.   Laboratories reveal a white count of 11.1, hemoglobin 15.6, hematocrit 45.9,  and platelets of 279.  His sodium was 140, KCL 3.4, chloride 106, CO2 25,  BUN 22, creatinine 1.4.  His glucose is 151.   ASSESSMENT/PLAN:  This is a 60 year old white male with an element of mental  handicap who presents with skin breakdown of the right leg after an Unna  boot was placed to treat venous stasis.  1.  Right leg ulceration:  Will use wet-to-dry dressings until the wound      care nurse  has seen the patient.  I have started him on vancomycin and      Zosyn.  Will follow up with the wound cultures taken in the ED.  I am      recommending Doppler ultrasounds in the lower extremities to rule out      deep venous thrombosis and will check AVIs in the vascular lab.  If      necessary, we will obtain a vascular consult  2.  Cardiovascular:  Patient is hypertensive.  We will continue his      atenolol, lisinopril, and diuretic.  3.  Pulmonary:  The patient likely has a history of sleep apnea, per his      dad; however, the patient refuses to wear a CPAP.  He does not use home      oxygen.  4.  Gastrointestinal:  The patient is obese.  There are no other obvious      issues.  5.  Genitourinary:  The patient smells of urine; therefore, we will check a      UA C&S and assist him with his ADLs.  6.  Neurologic:  The patient is mentally handicapped and will need      assistance with his ADLs.  We will attempt to place the patient near the      nurses' station to assure that he is safe.  7.  We will check a TSH and hemoglobin A1C to rule out possible occult      diabetes.       MLT/MEDQ  D:  07/20/2004  T:  07/20/2004  Job:  [id number]   cc:   Windle Guard, M.D.  [address]  [place], [place] [address]  Fax: [phone]

## 2011-03-19 NOTE — Discharge Summary (Signed)
NAME:  Eduardo Herman, Eduardo Herman NO.:  [id number]   MEDICAL RECORD NO.:  [id number]          PATIENT TYPE:  INP   LOCATION:  0455                         [facility]AC[facility]   PHYSICIAN:  Corinna L. Lendell Caprice, MDDATE OF BIRTH:  January 09, 1951   DATE OF ADMISSION:  07/20/2004  DATE OF DISCHARGE:  07/24/2004                                 DISCHARGE SUMMARY   DIAGNOSES:  1.  Venous stasis ulcer with bacterial super-infection.  2.  Hypertension.  3.  Hypothyroidism, need repeat TSH.  4.  Mental retardation.  5.  Sleep apnea, noncompliant with CPAP.  6.  Resolved ileus.   DISCHARGE MEDICATIONS:  1.  Augmentin liquid 800 mg p.o. b.i.d. x 5 more days.  2.  He is to continue his other outpatient medications which include:      Lisinopril 10 mg p.o. daily.  3.  Potassium 20 mEq p.o. daily.  4.  Synthroid 150 mcg p.o. daily.  5.  Triamterene/hydrochlorothiazide 37.5/25 mg p.o. daily.  6.  Atenolol 100 mg p.o. daily.   FOLLOW UP:  With Dr. Jeannetta Nap in a week.   ACTIVITY:  He is to keep his legs elevated.  Neomia Dear boot has been placed on the  right leg, and this should continue until healing of the ulcer.  It can be  changed every week.  Home health has been arranged for assistance with this.  His left leg, he should take a compression stocking, keep his legs elevated.   CONDITION AT DISCHARGE:  Stable.   PROCEDURES:  PICC line.   CONSULTS:  Wound care.   PERTINENT LABORATORY DATA:  Wound culture grew out Staph aureus which was  sensitive to everything except for penicillin.  Blood cultures negative.  Hemoglobin A1c 6.1.  TSH 8.794.  White blood cell count on admission was  11,000 with 90% neutrophils, 4% lymphocytes.  The rest of his CBC was  unremarkable.  Complete metabolic panel was significant for an albumin of  2.5, potassium 3.4, otherwise unremarkable.  UA negative.  Pertinent  laboratories:  EKG showed normal sinus rhythm with prolonged Q/T interval.   HISTORY AND HOSPITAL  COURSE:  Mr. Hollibaugh is a 60 year old, mentally retarded,  white male, who had been getting Una boots for venous stasis.  Apparently  upon dressing change, he was noted to have ulceration of the  anterior right  leg with purulent drainage.  He had normal vital signs upon presentation,  appeared nontoxic.  There was purulent drainage and superficial ulceration  of the right leg. Pulses were intact.  He also had changes consistent with  venous stasis dermatitis.  He also had some surrounding cellulitis.  The  patient was started on broad-spectrum antibiotics which were eventually  narrowed.  A PICC line was placed, as patient had poor IV access.  He  apparently pulled this out on his own, and he was switched to Augmentin.  The wound care nurse saw the wound and made recommendations.  At this point,  he is afebrile, and his leg is looking better.  He has still some pustules  and  should continue with his Augmentin; however, most of the purulence and  cellulitis has disappeared.   Also during his hospitalization, he developed some high-pitched bowel  sounds, and he was not eating.  He was not tender, and could not provide any  history; however, abdominal films did in fact show air fluid levels.  This  resolved on its own and the time of discharge, the patient was having bowel  movements and was eating well.  Also, he was noted to be hyperglycemic, and  this should be followed up as an outpatient.  His hyperglycemia was fairly  mild, and I have asked that he maintain a low concentrated sweet diet.  His  potassium was repleted.     Cori   CLS/MEDQ  D:  07/24/2004  T:  07/25/2004  Job:  [id number]   cc:   Windle Guard, M.D.  [address]  [place], [place] [address]  Fax: [phone]

## 2011-05-24 ENCOUNTER — Inpatient Hospital Stay (HOSPITAL_COMMUNITY)
Admission: EM | Admit: 2011-05-24 | Discharge: 2011-06-04 | DRG: 603 | Disposition: A | Discharge status: Home Health | Payer: Medicare Other | Attending: Internal Medicine | Admitting: Internal Medicine

## 2011-05-24 DIAGNOSIS — Z6841 Body Mass Index (BMI) 40.0 and over, adult: Secondary | ICD-10-CM

## 2011-05-24 DIAGNOSIS — E039 Hypothyroidism, unspecified: Secondary | ICD-10-CM | POA: Diagnosis present

## 2011-05-24 DIAGNOSIS — E119 Type 2 diabetes mellitus without complications: Secondary | ICD-10-CM | POA: Diagnosis present

## 2011-05-24 DIAGNOSIS — L02419 Cutaneous abscess of limb, unspecified: Principal | ICD-10-CM | POA: Diagnosis present

## 2011-05-24 DIAGNOSIS — N183 Chronic kidney disease, stage 3 unspecified: Secondary | ICD-10-CM | POA: Diagnosis present

## 2011-05-24 DIAGNOSIS — T368X5A Adverse effect of other systemic antibiotics, initial encounter: Secondary | ICD-10-CM | POA: Diagnosis not present

## 2011-05-24 DIAGNOSIS — Z79899 Other long term (current) drug therapy: Secondary | ICD-10-CM

## 2011-05-24 DIAGNOSIS — E876 Hypokalemia: Secondary | ICD-10-CM | POA: Diagnosis not present

## 2011-05-24 DIAGNOSIS — N179 Acute kidney failure, unspecified: Secondary | ICD-10-CM | POA: Diagnosis not present

## 2011-05-24 DIAGNOSIS — D3A010 Benign carcinoid tumor of the duodenum: Secondary | ICD-10-CM | POA: Diagnosis present

## 2011-05-24 DIAGNOSIS — F79 Unspecified intellectual disabilities: Secondary | ICD-10-CM | POA: Diagnosis present

## 2011-05-24 DIAGNOSIS — I129 Hypertensive chronic kidney disease with stage 1 through stage 4 chronic kidney disease, or unspecified chronic kidney disease: Secondary | ICD-10-CM | POA: Diagnosis present

## 2011-05-24 DIAGNOSIS — K560 Paralytic ileus: Secondary | ICD-10-CM | POA: Diagnosis not present

## 2011-05-24 DIAGNOSIS — G4733 Obstructive sleep apnea (adult) (pediatric): Secondary | ICD-10-CM | POA: Diagnosis present

## 2011-05-24 DIAGNOSIS — I872 Venous insufficiency (chronic) (peripheral): Secondary | ICD-10-CM | POA: Diagnosis present

## 2011-05-24 LAB — CBC
MCH: 28.9 pg (ref 26.0–34.0)
MCHC: 32.6 g/dL (ref 30.0–36.0)
MCV: 88.6 fL (ref 78.0–100.0)
Platelets: 231 10*3/uL (ref 150–400)
RBC: 5.09 MIL/uL (ref 4.22–5.81)
RDW: 14.7 % (ref 11.5–15.5)

## 2011-05-24 LAB — DIFFERENTIAL
Basophils Absolute: 0 10*3/uL (ref 0.0–0.1)
Basophils Relative: 0 % (ref 0–1)
Eosinophils Absolute: 0.2 10*3/uL (ref 0.0–0.7)
Lymphocytes Relative: 9 % — ABNORMAL LOW (ref 12–46)
Lymphs Abs: 1.6 10*3/uL (ref 0.7–4.0)
Monocytes Absolute: 0.7 10*3/uL (ref 0.1–1.0)
Neutro Abs: 15.5 10*3/uL — ABNORMAL HIGH (ref 1.7–7.7)

## 2011-05-24 LAB — BASIC METABOLIC PANEL
BUN: 20 mg/dL (ref 6–23)
CO2: 27 mEq/L (ref 19–32)
Calcium: 8.8 mg/dL (ref 8.4–10.5)
Creatinine, Ser: 0.82 mg/dL (ref 0.50–1.35)
GFR calc non Af Amer: >60 mL/min (ref >60)
Glucose, Bld: 140 mg/dL — ABNORMAL HIGH (ref 70–99)
Sodium: 141 mEq/L (ref 135–145)

## 2011-05-25 LAB — COMPREHENSIVE METABOLIC PANEL
AST: 9 U/L (ref 0–37)
Albumin: 2.6 g/dL — ABNORMAL LOW (ref 3.5–5.2)
Alkaline Phosphatase: 99 U/L (ref 39–117)
BUN: 17 mg/dL (ref 6–23)
Chloride: 107 mEq/L (ref 96–112)
Creatinine, Ser: 0.81 mg/dL (ref 0.50–1.35)
Potassium: 2.8 mEq/L — ABNORMAL LOW (ref 3.5–5.1)
Total Protein: 6.9 g/dL (ref 6.0–8.3)

## 2011-05-25 LAB — CBC
HCT: 42.8 % (ref 39.0–52.0)
MCHC: 32.2 g/dL (ref 30.0–36.0)
Platelets: 212 10*3/uL (ref 150–400)
RDW: 14.6 % (ref 11.5–15.5)
WBC: 17.8 10*3/uL — ABNORMAL HIGH (ref 4.0–10.5)

## 2011-05-25 LAB — GLUCOSE, CAPILLARY: Glucose-Capillary: 187 mg/dL — ABNORMAL HIGH (ref 70–99)

## 2011-05-25 LAB — TSH: TSH: 0.37 u[IU]/mL (ref 0.350–4.500)

## 2011-05-25 LAB — MAGNESIUM: Magnesium: 2.1 mg/dL (ref 1.5–2.5)

## 2011-05-25 LAB — HEMOGLOBIN A1C: Hgb A1c MFr Bld: 5.9 % — ABNORMAL HIGH (ref <5.7)

## 2011-05-25 NOTE — H&P (Unsigned)
NAME:  Eduardo Herman, Eduardo Herman NO.:  [id number]  MEDICAL RECORD NO.:  [id number]  LOCATION:  WLED                         FACILITY:  [facility]  PHYSICIAN:  Eduardo Herman, MDDATE OF BIRTH:  10-Sep-1951  DATE OF ADMISSION:  05/24/2011 DATE OF DISCHARGE:                             HISTORY & PHYSICAL   CHIEF COMPLAINT:  Swelling, pain, and redness of the lower extremities.  HISTORY OF PRESENT ILLNESS:  Eduardo Herman is a 60 year old Caucasian gentleman presented to the emergency department with complaints of swelling, pain, and erythema of the lower extremities, especially the right leg that is worse with movement.  Apparently, he was treated for several days with prednisone and Z-PAK for, stasis dermatitis, cellulitis, but the treatment was ineffective and symptoms progressed to the point the patient had to present to the emergency department today for an evaluation.  PAST MEDICAL HISTORY:  Significant for hypertension, hypothyroidism, mental regurgitation, obstructive sleep apnea.  The patient is noncompliant with CPAP, carcinoid tumor of the duodenum, exogenous obesity, and he is status post cholecystectomy.  SOCIAL HISTORY:  He is not smoking.  Does not drink alcohol.  Does not use illicit drugs.  He lives with his family and his father is his primary caretaker.  FAMILY HISTORY:  Noncontributory to this admission.  ALLERGIES:  No known drug allergies.  HOME MEDICATIONS:  The patient takes atenolol, enalapril Lasix, levothyroxine, diltiazem, and potassium chloride.  Doses are unknown and to be verified by pharmacy.  REVIEW OF SYSTEMS:  It is hard to obtain the full review of systems because the patient is not really conversant.  He just answers yes or no to simple questions and his speech is unclear.  However, he denied significant pain at the present time and refused any pain medications that was offered.  He denied any chest pain, shortness of breath. Denied  any nausea, constipation, or diarrhea.  CODE STATUS:  Unable to discuss code status with the patient secondary to his limited ability to understand complex issues due to mental exacerbation and code status would need to be addressed with the family.  PHYSICAL EXAMINATION:  VITAL SIGNS:  Blood pressure 155/82, pulse 92, temperature 98.9, respirations 16, pulse oximetry 95%. HEENT:  Normocephalic and atraumatic.  Extraocular movements intact. Sclerae anicteric.  Mucous membranes intact, moist. NECK:  Without JVD or carotid bruits. LUNGS:  Clear to auscultation bilaterally without wheezes, rhonchi, or rales. HEART:  Regular rate and rhythm.  Normal S1 and S2 without significant murmurs, gallops, or rubs. ABDOMEN:  Distended, very obese with positive bowel sounds x4.  Unable to palpate any masses or organomegaly. EXTREMITIES:  Lower extremities with 3+ pretibial and foot edema and bilateral skin changes compatible with stasis dermatitis right lower extremity has erythema extending to the mid thigh, warm to touch. Anterior surface of the shin has a weeping lesion draining yellowish discharge.  Dorsalis pedis pulses palpable bilaterally 1+. NEURO:  The patient is alert and oriented to self and place.  LABORATORY DATA:  His white blood cell count was 18, hemoglobin 14.7, hematocrit 45.1, platelet count 231.  Sodium 141, potassium 3.9, chloride 105, CO2 of 27, BUN 20, creatinine 0.82, glucose  140, calcium 8.8.  IMPRESSION: 1. Cellulitis of the right lower extremity - we will start the patient     on IV vancomycin and IV Zosyn per pharmacy protocol, also provide     pain relief with Tylenol or oxycodone as needed. 2. Venous stasis dermatitis with large lower extremity swelling.  He     might benefit from the IV Lasix therapy. 3. Hyperthyroidism.  We will recheck TSH and continue levothyroxine.     We will make dose adjustments as needed. 4. Hypertension.  Continue home meds. 5.  Hyperglycemia.  We will check hemoglobin A1c and put him on a     sliding scale if needed. 6. Deep venous thrombosis prophylaxis will be achieved with Lovenox. 7. Code status.  Needs to be addressed with family due to the patient     being mentally retarded.     Eduardo Herman, P.A.   ______________________________ Eduardo Cowboy, MD    MK/MEDQ  D:  05/25/2011  T:  05/25/2011  Job:  [id number]  cc:   Eduardo Massed, MD Fax: [phone]

## 2011-05-26 LAB — GLUCOSE, CAPILLARY
Glucose-Capillary: 137 mg/dL — ABNORMAL HIGH (ref 70–99)
Glucose-Capillary: 148 mg/dL — ABNORMAL HIGH (ref 70–99)
Glucose-Capillary: 164 mg/dL — ABNORMAL HIGH (ref 70–99)

## 2011-05-26 LAB — CBC
HCT: 43 % (ref 39.0–52.0)
Hemoglobin: 13.9 g/dL (ref 13.0–17.0)
MCH: 28.7 pg (ref 26.0–34.0)
MCHC: 32.3 g/dL (ref 30.0–36.0)
MCV: 88.7 fL (ref 78.0–100.0)
RBC: 4.85 MIL/uL (ref 4.22–5.81)

## 2011-05-26 LAB — DIFFERENTIAL
Lymphs Abs: 1.6 10*3/uL (ref 0.7–4.0)
Monocytes Absolute: 0.8 10*3/uL (ref 0.1–1.0)
Monocytes Relative: 6 % (ref 3–12)
Neutro Abs: 9.9 10*3/uL — ABNORMAL HIGH (ref 1.7–7.7)
Neutrophils Relative %: 77 % (ref 43–77)

## 2011-05-26 LAB — BASIC METABOLIC PANEL
BUN: 15 mg/dL (ref 6–23)
CO2: 25 mEq/L (ref 19–32)
Calcium: 8.6 mg/dL (ref 8.4–10.5)
Creatinine, Ser: 0.9 mg/dL (ref 0.50–1.35)
Glucose, Bld: 141 mg/dL — ABNORMAL HIGH (ref 70–99)

## 2011-05-27 ENCOUNTER — Inpatient Hospital Stay (HOSPITAL_COMMUNITY): Payer: Medicare Other

## 2011-05-27 LAB — BASIC METABOLIC PANEL
CO2: 26 mEq/L (ref 19–32)
Calcium: 8.7 mg/dL (ref 8.4–10.5)
Chloride: 103 mEq/L (ref 96–112)
Creatinine, Ser: 0.82 mg/dL (ref 0.50–1.35)
Glucose, Bld: 145 mg/dL — ABNORMAL HIGH (ref 70–99)
Sodium: 139 mEq/L (ref 135–145)

## 2011-05-27 LAB — GLUCOSE, CAPILLARY
Glucose-Capillary: 149 mg/dL — ABNORMAL HIGH (ref 70–99)
Glucose-Capillary: 192 mg/dL — ABNORMAL HIGH (ref 70–99)
Glucose-Capillary: 152 mg/dL — ABNORMAL HIGH (ref 70–99)

## 2011-05-27 LAB — WOUND CULTURE

## 2011-05-27 LAB — CBC
HCT: 45.9 % (ref 39.0–52.0)
Hemoglobin: 15 g/dL (ref 13.0–17.0)
MCH: 28.7 pg (ref 26.0–34.0)
MCHC: 32.7 g/dL (ref 30.0–36.0)

## 2011-05-27 LAB — DIFFERENTIAL
Basophils Relative: 0 % (ref 0–1)
Eosinophils Absolute: 0.6 10*3/uL (ref 0.0–0.7)
Monocytes Relative: 9 % (ref 3–12)
Neutrophils Relative %: 76 % (ref 43–77)

## 2011-05-27 MED ORDER — IOHEXOL 300 MG/ML  SOLN
100.0000 mL | Freq: Once | INTRAMUSCULAR | Status: AC | PRN
  Administered 2011-05-27: 100 mL via INTRAVENOUS

## 2011-05-28 LAB — GLUCOSE, CAPILLARY
Glucose-Capillary: 149 mg/dL — ABNORMAL HIGH (ref 70–99)
Glucose-Capillary: 136 mg/dL — ABNORMAL HIGH (ref 70–99)
Glucose-Capillary: 127 mg/dL — ABNORMAL HIGH (ref 70–99)

## 2011-05-28 LAB — CBC
HCT: 44.8 % (ref 39.0–52.0)
MCH: 29.2 pg (ref 26.0–34.0)
MCHC: 33.3 g/dL (ref 30.0–36.0)
MCV: 87.7 fL (ref 78.0–100.0)
Platelets: 256 10*3/uL (ref 150–400)
RDW: 14.6 % (ref 11.5–15.5)
WBC: 12.5 10*3/uL — ABNORMAL HIGH (ref 4.0–10.5)

## 2011-05-28 LAB — DIFFERENTIAL
Eosinophils Absolute: 0.2 10*3/uL (ref 0.0–0.7)
Eosinophils Relative: 2 % (ref 0–5)
Lymphs Abs: 1.1 10*3/uL (ref 0.7–4.0)
Monocytes Absolute: 1.3 10*3/uL — ABNORMAL HIGH (ref 0.1–1.0)

## 2011-05-28 LAB — BASIC METABOLIC PANEL
BUN: 20 mg/dL (ref 6–23)
Calcium: 8.4 mg/dL (ref 8.4–10.5)
Chloride: 107 mEq/L (ref 96–112)
Creatinine, Ser: 1.32 mg/dL (ref 0.50–1.35)
GFR calc Af Amer: >60 mL/min (ref >60)
GFR calc non Af Amer: 55 mL/min — ABNORMAL LOW (ref >60)

## 2011-05-28 LAB — MAGNESIUM: Magnesium: 2.4 mg/dL (ref 1.5–2.5)

## 2011-05-28 LAB — VANCOMYCIN, RANDOM: Vancomycin Rm: 24.6 ug/mL

## 2011-05-29 ENCOUNTER — Inpatient Hospital Stay (HOSPITAL_COMMUNITY): Payer: Medicare Other

## 2011-05-29 LAB — GLUCOSE, CAPILLARY
Glucose-Capillary: 123 mg/dL — ABNORMAL HIGH (ref 70–99)
Glucose-Capillary: 129 mg/dL — ABNORMAL HIGH (ref 70–99)

## 2011-05-29 LAB — CREATININE, SERUM
GFR calc Af Amer: >60 mL/min (ref >60)
GFR calc non Af Amer: >60 mL/min (ref >60)

## 2011-05-29 LAB — POTASSIUM: Potassium: 3 mEq/L — ABNORMAL LOW (ref 3.5–5.1)

## 2011-05-29 LAB — VANCOMYCIN, RANDOM: Vancomycin Rm: 11.7 ug/mL

## 2011-05-30 ENCOUNTER — Inpatient Hospital Stay (HOSPITAL_COMMUNITY): Payer: Medicare Other

## 2011-05-30 LAB — BASIC METABOLIC PANEL WITH GFR
BUN: 25 mg/dL — ABNORMAL HIGH (ref 6–23)
CO2: 27 meq/L (ref 19–32)
Calcium: 8.4 mg/dL (ref 8.4–10.5)
Chloride: 110 meq/L (ref 96–112)
Creatinine, Ser: 1.1 mg/dL (ref 0.50–1.35)
GFR calc Af Amer: >60 mL/min
GFR calc non Af Amer: >60 mL/min
Glucose, Bld: 110 mg/dL — ABNORMAL HIGH (ref 70–99)
Potassium: 2.7 meq/L — CL (ref 3.5–5.1)
Sodium: 147 meq/L — ABNORMAL HIGH (ref 135–145)

## 2011-05-30 LAB — DIFFERENTIAL
Basophils Absolute: 0.1 K/uL (ref 0.0–0.1)
Basophils Relative: 1 % (ref 0–1)
Eosinophils Absolute: 0.7 K/uL (ref 0.0–0.7)
Eosinophils Relative: 5 % (ref 0–5)
Lymphocytes Relative: 12 % (ref 12–46)
Lymphs Abs: 1.6 K/uL (ref 0.7–4.0)
Monocytes Absolute: 1.1 K/uL — ABNORMAL HIGH (ref 0.1–1.0)
Monocytes Relative: 9 % (ref 3–12)
Neutro Abs: 9.4 K/uL — ABNORMAL HIGH (ref 1.7–7.7)
Neutrophils Relative %: 73 % (ref 43–77)

## 2011-05-30 LAB — CBC
HCT: 45.1 % (ref 39.0–52.0)
MCH: 28.7 pg (ref 26.0–34.0)
MCHC: 32.4 g/dL (ref 30.0–36.0)
RDW: 14.5 % (ref 11.5–15.5)

## 2011-05-30 LAB — GLUCOSE, CAPILLARY
Glucose-Capillary: 113 mg/dL — ABNORMAL HIGH (ref 70–99)
Glucose-Capillary: 122 mg/dL — ABNORMAL HIGH (ref 70–99)

## 2011-05-31 ENCOUNTER — Inpatient Hospital Stay (HOSPITAL_COMMUNITY): Payer: Medicare Other

## 2011-05-31 DIAGNOSIS — K560 Paralytic ileus: Secondary | ICD-10-CM

## 2011-05-31 DIAGNOSIS — D3A010 Benign carcinoid tumor of the duodenum: Secondary | ICD-10-CM

## 2011-05-31 LAB — COMPREHENSIVE METABOLIC PANEL
AST: 35 U/L (ref 0–37)
Albumin: 2.4 g/dL — ABNORMAL LOW (ref 3.5–5.2)
Alkaline Phosphatase: 93 U/L (ref 39–117)
Chloride: 111 mEq/L (ref 96–112)
Potassium: 3 mEq/L — ABNORMAL LOW (ref 3.5–5.1)
Total Bilirubin: 0.9 mg/dL (ref 0.3–1.2)
Total Protein: 6.9 g/dL (ref 6.0–8.3)

## 2011-05-31 LAB — CULTURE, BLOOD (ROUTINE X 2): Culture  Setup Time: 201207240938

## 2011-05-31 LAB — CBC
MCHC: 32 g/dL (ref 30.0–36.0)
Platelets: 243 10*3/uL (ref 150–400)
RDW: 14.7 % (ref 11.5–15.5)
WBC: 13 10*3/uL — ABNORMAL HIGH (ref 4.0–10.5)

## 2011-05-31 LAB — CLOSTRIDIUM DIFFICILE BY PCR: Toxigenic C. Difficile by PCR: NEGATIVE

## 2011-05-31 LAB — DIFFERENTIAL
Basophils Absolute: 0.1 10*3/uL (ref 0.0–0.1)
Eosinophils Absolute: 0.5 10*3/uL (ref 0.0–0.7)
Monocytes Absolute: 0.9 10*3/uL (ref 0.1–1.0)
Neutrophils Relative %: 73 % (ref 43–77)

## 2011-05-31 LAB — GLUCOSE, CAPILLARY
Glucose-Capillary: 100 mg/dL — ABNORMAL HIGH (ref 70–99)
Glucose-Capillary: 113 mg/dL — ABNORMAL HIGH (ref 70–99)
Glucose-Capillary: 120 mg/dL — ABNORMAL HIGH (ref 70–99)

## 2011-06-01 ENCOUNTER — Inpatient Hospital Stay (HOSPITAL_COMMUNITY): Payer: Medicare Other

## 2011-06-01 LAB — GLUCOSE, CAPILLARY
Glucose-Capillary: 151 mg/dL — ABNORMAL HIGH (ref 70–99)
Glucose-Capillary: 107 mg/dL — ABNORMAL HIGH (ref 70–99)
Glucose-Capillary: 109 mg/dL — ABNORMAL HIGH (ref 70–99)

## 2011-06-01 LAB — URINE MICROSCOPIC-ADD ON

## 2011-06-01 LAB — CBC
HCT: 44.8 % (ref 39.0–52.0)
Hemoglobin: 14.7 g/dL (ref 13.0–17.0)
MCV: 87 fL (ref 78.0–100.0)
RBC: 5.15 MIL/uL (ref 4.22–5.81)
WBC: 18.1 10*3/uL — ABNORMAL HIGH (ref 4.0–10.5)

## 2011-06-01 LAB — URINALYSIS, ROUTINE W REFLEX MICROSCOPIC
Bilirubin Urine: NEGATIVE
Glucose, UA: NEGATIVE mg/dL
Ketones, ur: 15 mg/dL — AB
Nitrite: NEGATIVE
Protein, ur: 30 mg/dL — AB

## 2011-06-01 LAB — BASIC METABOLIC PANEL
BUN: 29 mg/dL — ABNORMAL HIGH (ref 6–23)
CO2: 20 mEq/L (ref 19–32)
Chloride: 107 mEq/L (ref 96–112)
Creatinine, Ser: 2.95 mg/dL — ABNORMAL HIGH (ref 0.50–1.35)

## 2011-06-01 LAB — DIFFERENTIAL
Basophils Relative: 1 % (ref 0–1)
Lymphocytes Relative: 9 % — ABNORMAL LOW (ref 12–46)
Monocytes Relative: 7 % (ref 3–12)
Neutro Abs: 14.6 10*3/uL — ABNORMAL HIGH (ref 1.7–7.7)

## 2011-06-02 DIAGNOSIS — K560 Paralytic ileus: Secondary | ICD-10-CM

## 2011-06-02 LAB — BASIC METABOLIC PANEL
BUN: 32 mg/dL — ABNORMAL HIGH (ref 6–23)
Calcium: 8.2 mg/dL — ABNORMAL LOW (ref 8.4–10.5)
Creatinine, Ser: 3.53 mg/dL — ABNORMAL HIGH (ref 0.50–1.35)
GFR calc Af Amer: 21 mL/min — ABNORMAL LOW (ref >60)
GFR calc non Af Amer: 18 mL/min — ABNORMAL LOW (ref >60)
Glucose, Bld: 106 mg/dL — ABNORMAL HIGH (ref 70–99)

## 2011-06-02 LAB — URINALYSIS, MICROSCOPIC ONLY
Bilirubin Urine: NEGATIVE
Glucose, UA: NEGATIVE mg/dL
Specific Gravity, Urine: 1.011 (ref 1.005–1.030)
Urobilinogen, UA: 0.2 mg/dL (ref 0.0–1.0)

## 2011-06-02 LAB — CBC
HCT: 42 % (ref 39.0–52.0)
Hemoglobin: 13.5 g/dL (ref 13.0–17.0)
MCH: 28.2 pg (ref 26.0–34.0)
MCHC: 32.1 g/dL (ref 30.0–36.0)
MCV: 87.9 fL (ref 78.0–100.0)
RDW: 15.2 % (ref 11.5–15.5)

## 2011-06-02 LAB — DIFFERENTIAL
Basophils Relative: 1 % (ref 0–1)
Eosinophils Absolute: 1.2 10*3/uL — ABNORMAL HIGH (ref 0.0–0.7)
Lymphs Abs: 1.8 10*3/uL (ref 0.7–4.0)
Monocytes Absolute: 1.2 10*3/uL — ABNORMAL HIGH (ref 0.1–1.0)
Neutro Abs: 13.1 10*3/uL — ABNORMAL HIGH (ref 1.7–7.7)
Neutrophils Relative %: 75 % (ref 43–77)

## 2011-06-02 LAB — VANCOMYCIN, TROUGH: Vancomycin Tr: 28.8 ug/mL (ref 10.0–20.0)

## 2011-06-02 LAB — URINE CULTURE: Culture: NO GROWTH

## 2011-06-02 LAB — GLUCOSE, CAPILLARY
Glucose-Capillary: 109 mg/dL — ABNORMAL HIGH (ref 70–99)
Glucose-Capillary: 138 mg/dL — ABNORMAL HIGH (ref 70–99)

## 2011-06-03 LAB — BASIC METABOLIC PANEL
CO2: 19 mEq/L (ref 19–32)
Chloride: 109 mEq/L (ref 96–112)
Creatinine, Ser: 2.98 mg/dL — ABNORMAL HIGH (ref 0.50–1.35)
GFR calc Af Amer: 26 mL/min — ABNORMAL LOW (ref >60)
Potassium: 3 mEq/L — ABNORMAL LOW (ref 3.5–5.1)
Sodium: 141 mEq/L (ref 135–145)

## 2011-06-03 LAB — GLUCOSE, CAPILLARY
Glucose-Capillary: 108 mg/dL — ABNORMAL HIGH (ref 70–99)
Glucose-Capillary: 109 mg/dL — ABNORMAL HIGH (ref 70–99)
Glucose-Capillary: 110 mg/dL — ABNORMAL HIGH (ref 70–99)
Glucose-Capillary: 122 mg/dL — ABNORMAL HIGH (ref 70–99)
Glucose-Capillary: 126 mg/dL — ABNORMAL HIGH (ref 70–99)

## 2011-06-03 LAB — CBC
MCV: 89.2 fL (ref 78.0–100.0)
Platelets: 223 10*3/uL (ref 150–400)
RBC: 4.55 MIL/uL (ref 4.22–5.81)
RDW: 15.4 % (ref 11.5–15.5)
WBC: 12.6 10*3/uL — ABNORMAL HIGH (ref 4.0–10.5)

## 2011-06-03 LAB — URINE CULTURE: Culture: NO GROWTH

## 2011-06-03 NOTE — Consult Note (Signed)
Eduardo Herman, Eduardo Herman NO.:  [id number]  MEDICAL RECORD NO.:  [id number]  LOCATION:  1529                         FACILITY:  [facility]  PHYSICIAN:  Jordan Hawks. Elnoria Howard, MD    DATE OF BIRTH:  October 25, 1951  DATE OF CONSULTATION:  05/29/2011 DATE OF DISCHARGE:                                CONSULTATION   GI Consultation  REFERRING PHYSICIAN:  Altha Harm, M.D.  PRIMARY GASTROENTEROLOGIST:  Iva Boop, M.D., [facility].  HISTORY OF PRESENT ILLNESS:  This is a 60 year old gentleman, who is admitted to the hospital with a cellulitis.  He had complained swelling, pain, redness and lower extremity edema and subsequently is identified to have a cellulitis.  During this hospitalization, he was progressing well with OT and also antibiotic treatment, although he had acute distention of his abdomen and a KUB was performed and showed that he had a marked distention of his small and large bowels and subsequently a CT scan was performed, which was reported as having no evidence of any obstruction hence most likely consistent with adynamic ileus; however, there is a tapering in the sigmoid colon, but no obvious mention of any obstruction at that time.  NG tube did help to decompress the small bowel, but his colon still remains markedly distended.  As a result of his symptoms, a GI consultation was requested for further evaluation and treatment.  PAST MEDICAL HISTORY AND PAST SURGICAL HISTORY:  Significant for: 1. Hypertension. 2. Hypothyroidism. 3. Mental retardation. 4. Obstructive sleep apnea. 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 6. Status post cholecystectomy. 7. Morbid obesity.  SOCIAL HISTORY:  Negative for alcohol, tobacco or illicit drug use. Patient lives with his father.  FAMILY HISTORY:  Noncontributory.  ALLERGIES:  No known drug allergies.  REVIEW OF SYSTEMS:  Unable to obtain.  MEDICATIONS: 1. Lovenox 70 mg subcu daily. 2. Lasix 40 mg IV daily. 3.  Metoprolol 10 mg IV q.6h. 4. Zosyn 3.375 grams IV q.8h. 5. Vancomycin 1500 mg IV daily. 6. Hydralazine 10 mg IV q.6h. p.r.n.  ALLERGIES:  No known drug allergies.  PHYSICAL EXAMINATION:  VITAL SIGNS:  Blood pressure is 152/89, heart rate is 67, respirations 20, temperature is 97.7. GENERAL:  The patient is in no acute distress, alert. HEENT:  Normocephalic, atraumatic.  Extraocular muscles intact. NECK:  Supple.  No lymphadenopathy. LUNGS:  Clear to auscultation bilaterally. CARDIOVASCULAR:  Regular rate and rhythm. ABDOMEN:  Morbidly obese, soft, but is distended and tympanic.  Some bowel sounds. EXTREMITIES:  Positive for edema and cellulitis.  LABORATORY DATA:  Laboratory evaluations, white blood cell count is 12.5, hemoglobin 14.9, MCV is 87.7, platelets at 256,000.  Sodium is 142, potassium 2.6, the chloride is 107, CO2 of 27, glucose 145, BUN 20, creatinine is 1.3.  IMPRESSION:  Ileus versus obstruction:  I will review the computed tomography scan with the radiologist as there was mention of a tapering in the sigmoid region, but again the official report states that there was no evidence of any obstruction.  If the patient does have any type of obstruction or narrowing within the sigmoid colon, it maybe beneficial for him to undergo a decompression.  If it is adynamic ileus, certainly, I would continue to encourage the physical activity with occupational therapy and his potassium needs to be corrected as it is markedly abnormal at 2.5.  this electrolyte imbalance can result in an ileus.  At this time, after my discussion with radiology, I will discern if he needs a barium enema in order to further identify an obstructive etiology.     Jordan Hawks Elnoria Howard, MD     PDH/MEDQ  D:  05/29/2011  T:  05/29/2011  Job:  [id number]  cc:   Iva Boop, MD,FACG [facility] [address] [place], [place] [address]  Electronically Signed by Jeani Hawking MD on 06/03/2011  11:22:57 AM

## 2011-06-04 LAB — BASIC METABOLIC PANEL
BUN: 21 mg/dL (ref 6–23)
CO2: 20 mEq/L (ref 19–32)
Calcium: 8.7 mg/dL (ref 8.4–10.5)
Glucose, Bld: 105 mg/dL — ABNORMAL HIGH (ref 70–99)
Potassium: 3.3 mEq/L — ABNORMAL LOW (ref 3.5–5.1)
Sodium: 144 mEq/L (ref 135–145)

## 2011-06-04 LAB — GLUCOSE, CAPILLARY
Glucose-Capillary: 107 mg/dL — ABNORMAL HIGH (ref 70–99)
Glucose-Capillary: 112 mg/dL — ABNORMAL HIGH (ref 70–99)

## 2011-06-05 NOTE — Discharge Summary (Signed)
NAME:  Eduardo Herman, Eduardo Herman NO.:  [id number]  MEDICAL RECORD NO.:  [id number]  LOCATION:  1529                         FACILITY:  [facility]  PHYSICIAN:  Marinda Elk, M.D.DATE OF BIRTH:  Sep 27, 1951  DATE OF ADMISSION:  05/24/2011 DATE OF DISCHARGE:  06/04/2011                              DISCHARGE SUMMARY   PRIMARY CARE DOCTOR:  Dr. Lilian Kapur  DISCHARGE DIAGNOSES: 1. Cellulitis of the right lower extremity. 2. Acute kidney injury secondary to vancomycin. 3. Bilateral lower extremity stasis. 4. Adynamic ileus. 5. Hyperkalemia. 6. Obesity. 7. Leukocytosis.  IMAGING: 1. Portable chest x-ray showed right arm in PICC and AV junction. 2. Abdominal x-ray showed some interval decrease in degree of small     bowel dilation with persistent small bowel diffuse distention     suggest adynamic ileus, no free air. 3. Abdominal x-ray persistent small bowel distention with     decompression. 4. Abdominal x-ray showed stable to progressive bowel colonic ileus     pattern. 5. Abdominal x-ray May 29 2011, showed interval placement of     nasogastric tube with decompression of dilated small loops,     persistent distention of colon. 6. CT scan of the abdomen and pelvis showed small and colonic     distention without evidence of focal obstruction or mass suggested     adynamic ileus. 7. Abdominal x-ray May 27, 2011 showed considerable gaseous     distention of the large and small bowel.  MEDICATIONS:  Potassium 20 mEq 2 tablets daily.  BRIEF ADMITTING HISTORY AND PHYSICAL:  This is a 60 year old male who presents to the emergency room with complaints of swelling, pain, and erythema in his lower extremity; specially in the right leg that is worse with movement.  Apparently, he was treated several days with prednisone and Z-Pak for stasis dermatitis and cellulitis, but this was ineffective and symptoms progressively worse to the point where he presents to the emergency  room for further evaluation, so we are asked to admit him further evaluate.  Please refer to dictation from May 23, 2011, for further details.  ASSESSMENT/PLAN:  Please refer to dictation on June 01, 2011, for further details by Dr. Ashley Royalty. 1. Cellulitis of his right lower extremity.  The patient was admitted,     was started on IV vancomycin.  The patient has now received 7-day     course of IV vancomycin and Zosyn and appearance of his lower     extremity has improved considerably and resolved.  The patient was      also seen by wound care and received local wound care with medication,     Cultures have so far been negative and revealed no pathogens, and      he completed his course in the hospital. 2. Acute kidney injury probably secondary to vancomycin and probably     the Lasix.  These were held.  He was given IV fluids.  His     creatinine improved and continues to improve.  His baseline at 0.8     and it peak at 3.5; on the day of discharge is 2.5.  He will follow  up with his primary care physician who will get a BMET to follow     up.  He will also stop his enalapril until he sees his primary care     doctor and will resume his Lasix in 2 days. 3. Bilateral lower extremity stasis dermatitis.  The patient has     chronic dermatitis and would likely benefit from compression hose     at his lower extremity; however, apparently he does not like to     wear them as per his family and that is unfortunate for this     patient and his chronic dermatitis probably will benefit from this. 4. Adynamic ileus.  Approximately 3 days into the hospitalization, he     was noted to have some distended abdomen and having discomfort.  We     will get abdominal x-ray was done that showed results above.  NG     tube was placed.  His abdomen got decompressed but he pulled his NG     out.  GI recommended not putting the NG back in, start him on a     liquid clear diet and Reglan which he did.  He  was tolerating his     diet well.  His IV Reglan had to be stopped secondary to diarrhea     and also his stool softener has to be stopped secondary to     diarrhea.  Now, this has resolved. 5. Hypokalemia.  The patient has been profoundly hyperkalemic.  He was     given potassium supplements which he will continue at home on day     of discharge 3.5.  He will continue his home dose of potassium 20     mEq 2 tablets and follow up with his primary care doctor as an     outpatient. 6. Hypothyroidism.  Continue home meds, no changes were made. 7. Chronic carcinoma.  This is a well-known, however, he did not     appear to have any involvement in the patient's clinical settings. 8. Leukocytosis.  It is probably secondary to acute kidney injury.  He     remained afebrile.  His antibiotics were stopped.  Once his     creatinine started improving, his white count came down. 9. Hypertension.  Currently, his enalapril and Lasix were stopped     secondary to acute kidney injury.  His Lasix was resumed.  This     will be started by his primary care doctor as an outpatient.  On     day of discharge, his blood pressure was 160/81.  LABORATORY DATA:  On day of discharge shows mag of 2.2, his sodium was 144, potassium 3.3, chloride 111, bicarb of 20, glucose 105, BUN of 21, creatinine 2.5, and calcium of 8.7.  VITALS ON DAY OF DISCHARGE:  VITAL SIGNS:  Temperature of 98, pulse 77, respirations 18, and blood pressure 160/81.  He was saturating 96% on room air.  DISPOSITION:  He will follow up with his primary care doctor next week. We will check a BMET and start his enalapril if his creatinine is back to baseline.  We will see if he is doing on his blood pressure and titrate blood pressure medications as needed pain.  He meets with the BMET at the next followup.     Marinda Elk, M.D.     AF/MEDQ  D:  06/04/2011  T:  06/05/2011  Job:  [id number]  cc:   Dr. Lilian Kapur  Electronically  Signed by Marinda Elk M.D. on 06/05/2011 08:09:35 PM

## 2011-06-15 NOTE — Progress Notes (Signed)
NAME:  Eduardo Herman, Eduardo Herman NO.:  [id number]  MEDICAL RECORD NO.:  [id number]  LOCATION:  1529                         FACILITY[facility]  [facility]  PHYSICIAN:  Altha Harm, MDDATE OF BIRTH:  [birth year]                                PROGRESS NOTE   DATE OF DISCHARGE: Not yet determined.  DIAGNOSES AT THE TIME OF THIS DICTATION: 1. Cellulitis of the right lower extremity. 2. Bilateral lower extremity stasis dermatitis. 3. Adynamic ileus. 4. Hypokalemia. 5. Obesity. 6. Obstructive sleep apnea. 7. Mental retardation. 8. History of carcinoid tumor of the duodenum.  DISCHARGE MEDICATIONS: To be determined at the time of discharge by discharging physician.  PRIMARY CARE PHYSICIAN: Jeoffrey Massed, MD  PRIMARY GASTROENTEROLOGIST: Iva Boop, MD, [facility]  PRIMARY SURGEON: Anselm Pancoast. Zachery Dakins, M.D.  PROCEDURES: PICC line placement.  DIAGNOSTIC STUDIES: 1. Portable abdominal x-ray which shows considerable gaseous     distention of the large and small bowel.  Ileus versus partial     colonic obstruction. 2. CT of the abdomen and pelvis done on the 26th which shows small     bowel and colonic distention without evidence of focal obstruction     or mass, suggesting adynamic ileus.     a.     Borderline retroperitoneal and pelvic adenopathy as before.     b.     Coronary and aortoiliac calcifications. 3. One-view abdominal x-ray done on the 28th which shows interval     placement of nasogastric tube with decompression of dilated small     bowel loops.  Persistent distention of the colon. 4. Abdominal x-ray, stable to progressive small bowel and colonic     ileus pattern.  The nasogastric tube appears to have been     withdrawn. 5. Abdominal 2-view x-ray on the 30th which shows persistent small     bowel distention with decompression of the colon.  Changes might     represent residual ileus, but a small-bowel obstruction is not     excluded. 6. Two-view  abdomen on the 31st which shows some interval decrease in     the small bowel dilatation with persistent small bowel diffuse     distention, suggesting adynamic ileus.  No free air. 7. Portable chest x-ray after placement of the PICC line which shows a     right arm PICC to the cavoatrial junction.  Low volumes with     probable pulmonary vascular congestion.  CHIEF COMPLAINT: Swelling, pain, and redness of the lower extremities.  HISTORY OF PRESENT ILLNESS: Please refer to the H and P by Dr. Adela Glimpse for details of the HPI. However, in short, this is a 60 year old Caucasian gentleman who presented to the emergency room with complaints of swelling, pain, and erythema of the lower extremities right greater than left.  He was treated empirically for several days with prednisone and a Z-PAK, but the treatment was ineffective and symptoms progressed to the point where the patient presented to the emergency room for evaluation.  HOSPITAL COURSE: 1. Cellulitis of the right lower extremity.  The patient was admitted,     started on IV vancomycin and Zosyn.  The  patient has now received 7     days of antibiotics of the vancomycin and Zosyn and his appearance     of the right lower extremity has improved considerably.  The     patient was also seen by Wound Therapy and received local wound     care with medication injected, gauze applied to the lower     extremity.  The patient should complete additional 3 days of     antibiotic and then discontinue.  Cultures so far have been     negative on this patient and have not revealed any pathogens     significant in the infection of the lower extremity. 2. Stasis dermatitis.  The patient has chronic stasis dermatitis and     would likely benefit from a compression hose to the lower     extremities.  However, apparently he does not like to wear the hose     per his family and that is unfortunate for this patient with     chronic stasis dermatitis,  who would probably benefit from this. 3. Adynamic ileus.  Approximately 3 days into this hospitalization,     the patient was noted to be distended and having some discomfort     with liquids.  Abdominal x-ray was obtained which showed a     significant ileus.  NG tube was placed; however, the patient pulled     the NG tube out.  GI recommended not putting the NG tube back in.     The patient has since been placed on a clear liquid diet which he     has been tolerating well.  Dr. Leone Payor of Gastroenterology, who is     the patient's primary gastroenterologist has been involved in his     care. 4. Hypokalemia.  The patient has been profoundly hypokalemic and     despite numerous potassium chloride IVs, this patient still has not     been able to normalize his potassium.  His magnesium is found to be     normal; however, the patient still continues to receive IV     potassium.  We will continue to replete the patient's potassium as     this likely has a significant implication in the patient's ileus. 5. Hypothyroidism.  TSH has been checked and found to be normal     signaling appropriate supplementation of his thyroxine. 6. Obstructive sleep apnea.  The patient is continued on a CPAP. 7. History of carcinoid duodenal tumor.  This is well known, however,     it does not appear to have any involvement in the patient's current     clinical situation. 8. Leukocytosis.  The patient has had a leukocytosis that has     escalated from 12.9 to 18.1 within a 48-hour period.  At this time,     the patient does not have a source of the leukocytosis.  Today, he     was noted to have some very sedimentary urine, the urine has been     sent for urinalysis and culture, and we will await the results.     The patient has not had any significant diarrhea.  For a few days,     he did have initial diarrhea and he was found to be C difficile     negative by PCR.  However, if the patient continues to have an      escalation of the white blood cell count with diarrhea, it  may be     of benefit to start him empirically on Flagyl. 9. Hypertension.  The patient has had malignant hypertension, mainly     due to the fact that he has not been able to take his oral     medications and thus he has been put on IV supplementation.  The     blood pressures have been ranging as high as in the 200s; however,     at this time, his blood pressure has been staying between the 130s     to 180 systolic and hovering around 70s to 90 diastolic.  We would     continue to titrate his blood pressures.  Condition at the time of this dictation is as follows:  PHYSICAL EXAMINATION: GENERAL:  The patient is alert and engaging. VITAL SIGNS:  His blood pressure is 155/87, heart rate 84, temperature 98.8, respiratory rate 20, O2 sats are 96% on room air. HEENT:  He is normocephalic, atraumatic.  The patient appears somewhat red in the face; however, this appears to change with his position. Oropharynx is moist.  No exudate, erythema, or lesions are noted. NECK EXAMINATION:  The patient has an obese neck, it is supple.  Trachea is midline.  No masses, no thyromegaly, no JVD, no carotid bruit. CARDIOVASCULAR:  He has got a normal S1 and S2.  No murmurs, rubs, or gallops are noted.  PMI is nondisplaced.  No heaves or thrills on palpation. ABDOMEN:  Distended.  He has diminished bowel sounds, however, it is soft and there is no masses or hepatosplenomegaly noted. EXTREMITIES:  The patient has decreased erythema, warmth, and swelling in bilateral lower extremities.  The local care to his right lower extremity appears to be improving the integument at that area.  This brings [place] up-to-date on the patient's current clinical status.     Altha Harm, MD     MAM/MEDQ  D:  06/01/2011  T:  06/01/2011  Job:  [id number]  Electronically Signed by Marthann Schiller MD on 06/15/2011 08:54:40 PM

## 2011-06-30 ENCOUNTER — Encounter (HOSPITAL_BASED_OUTPATIENT_CLINIC_OR_DEPARTMENT_OTHER): Payer: Medicare Other | Attending: Plastic Surgery

## 2011-06-30 DIAGNOSIS — L97809 Non-pressure chronic ulcer of other part of unspecified lower leg with unspecified severity: Secondary | ICD-10-CM | POA: Insufficient documentation

## 2011-06-30 DIAGNOSIS — E669 Obesity, unspecified: Secondary | ICD-10-CM | POA: Insufficient documentation

## 2011-06-30 DIAGNOSIS — I10 Essential (primary) hypertension: Secondary | ICD-10-CM | POA: Insufficient documentation

## 2011-06-30 DIAGNOSIS — I872 Venous insufficiency (chronic) (peripheral): Secondary | ICD-10-CM | POA: Insufficient documentation

## 2011-06-30 DIAGNOSIS — E039 Hypothyroidism, unspecified: Secondary | ICD-10-CM | POA: Insufficient documentation

## 2011-06-30 DIAGNOSIS — I890 Lymphedema, not elsewhere classified: Secondary | ICD-10-CM | POA: Insufficient documentation

## 2011-06-30 NOTE — Progress Notes (Signed)
[facility]  NAME:  Eduardo Herman, Eduardo Herman NO.:  [id number]  MEDICAL RECORD NO.:  [id number]      DATE OF BIRTH:  1951/09/21  PHYSICIAN:  Wayland Denis, DO       VISIT DATE:  06/30/2011                                  OFFICE VISIT   CHIEF COMPLAINT:  Bilaterally lower extremity wound.  HISTORY OF PRESENT ILLNESS:  Mr. Studer is a 60 year old white male who is here with his father for evaluation of bilateral lower extremity swelling and wound.  He was admitted recently for increased swelling and pain in the lower extremities.  He was treated with vancomycin and Zosyn, which helped improve the cellulitis of his lower extremities particularly on the right side.  Initial cultures were negative.  Films were done while he was in the hospital, but not related to the leg.  His dad has been using hydrogen peroxide to clean the legs at home.  He has had several open areas that are mostly blistered and superficial with a venous stasis appearance as well as dermatitis and lymphedema.  The legs are obviously swollen, red, tender.  The skin and subcutaneous tissue is very hard and thick and edematous with weepy overall consistency to it. Nothing particularly makes it better or worse that they can tell at the moment.  He was discharged to home and his dad takes care of him.  FAMILY HISTORY:  Noncontributory.  SOCIAL HISTORY:  He does smoke about half pack per day.  Denies any alcohol or illicit drugs.  PAST MEDICAL HISTORY: 1. Hypothyroidism. 2. Mental retardation. 3. Obstructive sleep apnea. 4. Hypertension. 5. Obesity. 6. Gallbladder disease.  PAST SURGICAL HISTORY:  Cholecystectomy and urology surgery.  MEDICATIONS:  He does not have his list with him today, but stated that he is going to bring it with him next week.  ALLERGIES:  No known drug allergies.  REVIEW OF SYSTEMS:  Negative otherwise stated in the HPI.  PHYSICAL EXAMINATION:  GENERAL:   He is alert and oriented to person and place.  His dad is his historian.  He is pleasant, but slightly agitated and wants to go home. HEENT:  His pupils are equal.  Extraocular muscles are intact. NECK:  No cervical lymphadenopathy. LUNGS:  His breathing is unlabored. HEART:  Regular. ABDOMEN:  Obese, but nontender.  Unable to palpate organomegaly.  No hernia palpated. EXTREMITIES:  Lower extremities are as described.  He does have capillary refill.  Otherwise, I stated his exam.  ASSESSMENT:  Bilateral lower extremity venous stasis ulcers and lymphedema, chronic.  PLAN:  Elevation, multivitamin, Profore Lite with zinc and Bag Balm for the periwound area.  It would be nice to get him to compression so that he could be able to do this at home.  We did talk about that.  We will see him back in 1 week.  We will also refer him to vascular for consultation to be sure if there is anything from a intervention standpoint that will help decrease the likelihood of wound breakdown and improvement of  actual blood flow.  He acknowledged agreement with this plan as well as his dad and we will see him in a week.     [employer], DO  CS/MEDQ  D:  06/30/2011  T:  06/30/2011  Job:  [id number]

## 2011-07-07 ENCOUNTER — Encounter (HOSPITAL_BASED_OUTPATIENT_CLINIC_OR_DEPARTMENT_OTHER): Payer: Medicare Other | Attending: Plastic Surgery

## 2011-07-07 DIAGNOSIS — L97809 Non-pressure chronic ulcer of other part of unspecified lower leg with unspecified severity: Secondary | ICD-10-CM | POA: Insufficient documentation

## 2011-07-07 DIAGNOSIS — I872 Venous insufficiency (chronic) (peripheral): Secondary | ICD-10-CM | POA: Insufficient documentation

## 2011-07-07 DIAGNOSIS — I10 Essential (primary) hypertension: Secondary | ICD-10-CM | POA: Insufficient documentation

## 2011-07-07 DIAGNOSIS — E039 Hypothyroidism, unspecified: Secondary | ICD-10-CM | POA: Insufficient documentation

## 2011-07-07 DIAGNOSIS — I890 Lymphedema, not elsewhere classified: Secondary | ICD-10-CM | POA: Insufficient documentation

## 2011-07-07 DIAGNOSIS — E669 Obesity, unspecified: Secondary | ICD-10-CM | POA: Insufficient documentation

## 2011-07-07 NOTE — Progress Notes (Signed)
[facility]  NAME:  REHAAN, VILORIA NO.:  [id number]  MEDICAL RECORD NO.:  [id number]      DATE OF BIRTH:  05-04-1951  PHYSICIAN:  Wayland Denis, DO       VISIT DATE:  07/07/2011                                  OFFICE VISIT   Mr. Beneke is a 60 year old white male here with family member for followup on his lymphedema.  He has had zinc and Profore Lite on this past week.  Unfortunately, he did not keep his legs elevated and developed some blistering, bilateral lower extremities, worse on the left.  There has been no change in his medications, social history, or review of systems.  PHYSICAL EXAMINATION:  GENERAL:  On exam, he is alert, cooperative, not in any acute distress.  It is not clear how aware he is of what is really going on, but his family member is aware and says he has not been keeping his legs elevated. HEENT:  His pupils are equal.  Extraocular muscles are intact. CHEST:  Breathing is unlabored. HEART:  Regular. ABDOMEN:  Large, but nontender.  We are going to continue with the Profore Lite, but we will put zinc on and Aquacel Ag to help with some of the drainage and see him back in 1 week.  He is also to continue with his multivitamin.     Wayland Denis, DO     CS/MEDQ  D:  07/07/2011  T:  07/07/2011  Job:  [id number]

## 2011-07-15 NOTE — Progress Notes (Signed)
[facility]  NAME:  JOHSUA, SHEVLIN NO.:  [id number]  MEDICAL RECORD NO.:  [id number]      DATE OF BIRTH:  21-Oct-1951  PHYSICIAN:  Wayland Denis, DO       VISIT DATE:  07/14/2011                                  OFFICE VISIT   Mr. Guin is a 60 year old white male who is here in followup with his dad for evaluation of his bilateral lower extremity ulcers.  These ulcers are a result of lymphedema, vascular disease, insufficiency in his venous system, and peripheral vascular disease in general.  His obesity and hypertension certainly do not help.  There has been no change in his medications and he is still managing his blood pressure with atenolol, his thyroid with levothyroxine, furosemide, K-Dur, and diltiazem for his heart.  He has been using zinc oxalate, doing Profore Lite, this has helped quite a bit, his right lower extremity is healed, but still edematous.  The left lower extremity has improved with the ulcers more superficial and smaller in size.  There does not appear to be any sign of infection.  PHYSICAL EXAMINATION:  GENERAL:  He is alert, oriented, cooperative, and not in any acute distress.  Most of the history is getting from his father. HEENT:  His pupils are equal.  Extraocular muscles are intact. RESPIRATORY:  His breathing is unlabored. HEART:  Regular. ABDOMEN:  Large but soft.  No tenderness. EXTREMITIES:  Lower extremities are as described.  RECOMMENDATIONS:  Continue with zinc oxalate, do Profore Lite on the left and compression stocking on the right.  This is his fourth week of compression therapy.  It has definitely helped and he is now in the situation with that he will need long-term compression because the lymphedema is not going to improve and could lead to further breakdown in his skin.  He would be a good candidate for home compression therapy if possible which could help decrease the risk of recurrence.  We  will see him back in 1 week.     Wayland Denis, DO     CS/MEDQ  D:  07/14/2011  T:  07/15/2011  Job:  [id number]

## 2011-08-06 LAB — GLUCOSE, CAPILLARY
Glucose-Capillary: 100 mg/dL — ABNORMAL HIGH (ref 70–99)
Glucose-Capillary: 101 mg/dL — ABNORMAL HIGH (ref 70–99)
Glucose-Capillary: 103 mg/dL — ABNORMAL HIGH (ref 70–99)
Glucose-Capillary: 108 mg/dL — ABNORMAL HIGH (ref 70–99)
Glucose-Capillary: 112 mg/dL — ABNORMAL HIGH (ref 70–99)
Glucose-Capillary: 114 mg/dL — ABNORMAL HIGH (ref 70–99)
Glucose-Capillary: 117 mg/dL — ABNORMAL HIGH (ref 70–99)
Glucose-Capillary: 118 mg/dL — ABNORMAL HIGH (ref 70–99)
Glucose-Capillary: 119 mg/dL — ABNORMAL HIGH (ref 70–99)
Glucose-Capillary: 119 mg/dL — ABNORMAL HIGH (ref 70–99)
Glucose-Capillary: 120 mg/dL — ABNORMAL HIGH (ref 70–99)
Glucose-Capillary: 124 mg/dL — ABNORMAL HIGH (ref 70–99)
Glucose-Capillary: 128 mg/dL — ABNORMAL HIGH (ref 70–99)
Glucose-Capillary: 45 mg/dL — ABNORMAL LOW (ref 70–99)
Glucose-Capillary: 66 mg/dL — ABNORMAL LOW (ref 70–99)
Glucose-Capillary: 72 mg/dL (ref 70–99)
Glucose-Capillary: 82 mg/dL (ref 70–99)
Glucose-Capillary: 85 mg/dL (ref 70–99)
Glucose-Capillary: 93 mg/dL (ref 70–99)
Glucose-Capillary: 96 mg/dL (ref 70–99)

## 2011-08-06 LAB — DIFFERENTIAL
Eosinophils Absolute: 0.1 10*3/uL (ref 0.0–0.7)
Eosinophils Relative: 1 % (ref 0–5)
Lymphs Abs: 2.1 10*3/uL (ref 0.7–4.0)
Monocytes Relative: 5 % (ref 3–12)

## 2011-08-06 LAB — POCT I-STAT, CHEM 8
BUN: 17 mg/dL (ref 6–23)
Calcium, Ion: 0.84 mmol/L — ABNORMAL LOW (ref 1.12–1.32)
Chloride: 113 meq/L — ABNORMAL HIGH (ref 96–112)
Creatinine, Ser: 0.8 mg/dL (ref 0.4–1.5)
Glucose, Bld: 58 mg/dL — ABNORMAL LOW (ref 70–99)
HCT: 48 % (ref 39.0–52.0)
Hemoglobin: 16.3 g/dL (ref 13.0–17.0)
Potassium: 4.6 meq/L (ref 3.5–5.1)
Sodium: 140 meq/L (ref 135–145)
TCO2: 22 mmol/L (ref 0–100)

## 2011-08-06 LAB — CBC
HCT: 45.3 % (ref 39.0–52.0)
Hemoglobin: 15.1 g/dL (ref 13.0–17.0)
MCV: 80.8 fL (ref 78.0–100.0)
Platelets: 247 10*3/uL (ref 150–400)
RBC: 5.61 MIL/uL (ref 4.22–5.81)
RDW: 16 % — ABNORMAL HIGH (ref 11.5–15.5)
WBC: 15 10*3/uL — ABNORMAL HIGH (ref 4.0–10.5)
WBC: 7.1 10*3/uL (ref 4.0–10.5)

## 2011-08-06 LAB — CULTURE, BLOOD (ROUTINE X 2): Culture: NO GROWTH

## 2011-08-06 LAB — BASIC METABOLIC PANEL
BUN: 10 mg/dL (ref 6–23)
BUN: 9 mg/dL (ref 6–23)
Calcium: 8.3 mg/dL — ABNORMAL LOW (ref 8.4–10.5)
Chloride: 105 mEq/L (ref 96–112)
Creatinine, Ser: 0.85 mg/dL (ref 0.4–1.5)
GFR calc non Af Amer: >60 mL/min (ref >60)
GFR calc non Af Amer: >60 mL/min (ref >60)
Glucose, Bld: 85 mg/dL (ref 70–99)
Glucose, Bld: 97 mg/dL (ref 70–99)
Potassium: 3.2 mEq/L — ABNORMAL LOW (ref 3.5–5.1)
Potassium: 3.4 mEq/L — ABNORMAL LOW (ref 3.5–5.1)
Sodium: 141 mEq/L (ref 135–145)

## 2011-08-06 LAB — URINALYSIS, ROUTINE W REFLEX MICROSCOPIC
Glucose, UA: 250 mg/dL — AB
Hgb urine dipstick: NEGATIVE
Ketones, ur: NEGATIVE mg/dL
Protein, ur: NEGATIVE mg/dL

## 2011-08-25 ENCOUNTER — Encounter (HOSPITAL_BASED_OUTPATIENT_CLINIC_OR_DEPARTMENT_OTHER): Payer: Medicare Other | Attending: Plastic Surgery

## 2011-08-25 DIAGNOSIS — L97809 Non-pressure chronic ulcer of other part of unspecified lower leg with unspecified severity: Secondary | ICD-10-CM | POA: Insufficient documentation

## 2011-08-25 DIAGNOSIS — E669 Obesity, unspecified: Secondary | ICD-10-CM | POA: Insufficient documentation

## 2011-08-25 DIAGNOSIS — I10 Essential (primary) hypertension: Secondary | ICD-10-CM | POA: Insufficient documentation

## 2011-08-25 DIAGNOSIS — I890 Lymphedema, not elsewhere classified: Secondary | ICD-10-CM | POA: Insufficient documentation

## 2011-08-25 DIAGNOSIS — I872 Venous insufficiency (chronic) (peripheral): Secondary | ICD-10-CM | POA: Insufficient documentation

## 2011-08-25 DIAGNOSIS — I739 Peripheral vascular disease, unspecified: Secondary | ICD-10-CM | POA: Insufficient documentation

## 2011-08-25 DIAGNOSIS — Z79899 Other long term (current) drug therapy: Secondary | ICD-10-CM | POA: Insufficient documentation

## 2011-09-29 ENCOUNTER — Encounter (HOSPITAL_BASED_OUTPATIENT_CLINIC_OR_DEPARTMENT_OTHER): Payer: Medicare Other

## 2012-01-31 DEATH — deceased

## 2012-02-28 ENCOUNTER — Ambulatory Visit
Admission: RE | Admit: 2012-02-28 | Discharge: 2012-02-28 | Disposition: A | Discharge status: Home / Self Care | Payer: Medicare Other | Source: Ambulatory Visit | Attending: Internal Medicine | Admitting: Internal Medicine

## 2012-02-28 ENCOUNTER — Other Ambulatory Visit: Payer: Self-pay | Admitting: Internal Medicine

## 2012-11-04 ENCOUNTER — Encounter (HOSPITAL_COMMUNITY): Payer: Self-pay | Admitting: Emergency Medicine

## 2012-11-04 ENCOUNTER — Observation Stay (HOSPITAL_COMMUNITY): Payer: Medicare Other

## 2012-11-04 ENCOUNTER — Inpatient Hospital Stay (HOSPITAL_COMMUNITY)
Admission: EM | Admit: 2012-11-04 | Discharge: 2012-11-07 | DRG: 916 | Disposition: A | Discharge status: Home / Self Care | Payer: Medicare Other | Attending: Internal Medicine | Admitting: Internal Medicine

## 2012-11-04 DIAGNOSIS — Z6841 Body Mass Index (BMI) 40.0 and over, adult: Secondary | ICD-10-CM

## 2012-11-04 DIAGNOSIS — Q909 Down syndrome, unspecified: Secondary | ICD-10-CM

## 2012-11-04 DIAGNOSIS — E669 Obesity, unspecified: Secondary | ICD-10-CM | POA: Diagnosis present

## 2012-11-04 DIAGNOSIS — T783XXA Angioneurotic edema, initial encounter: Principal | ICD-10-CM | POA: Diagnosis present

## 2012-11-04 DIAGNOSIS — G4733 Obstructive sleep apnea (adult) (pediatric): Secondary | ICD-10-CM | POA: Diagnosis present

## 2012-11-04 DIAGNOSIS — R600 Localized edema: Secondary | ICD-10-CM | POA: Diagnosis present

## 2012-11-04 DIAGNOSIS — T46905A Adverse effect of unspecified agents primarily affecting the cardiovascular system, initial encounter: Secondary | ICD-10-CM | POA: Diagnosis present

## 2012-11-04 DIAGNOSIS — R609 Edema, unspecified: Secondary | ICD-10-CM

## 2012-11-04 DIAGNOSIS — E039 Hypothyroidism, unspecified: Secondary | ICD-10-CM | POA: Diagnosis present

## 2012-11-04 DIAGNOSIS — I10 Essential (primary) hypertension: Secondary | ICD-10-CM | POA: Diagnosis present

## 2012-11-04 DIAGNOSIS — F79 Unspecified intellectual disabilities: Secondary | ICD-10-CM | POA: Diagnosis present

## 2012-11-04 DIAGNOSIS — T782XXA Anaphylactic shock, unspecified, initial encounter: Secondary | ICD-10-CM | POA: Diagnosis present

## 2012-11-04 DIAGNOSIS — D3A010 Benign carcinoid tumor of the duodenum: Secondary | ICD-10-CM

## 2012-11-04 HISTORY — DX: Hypothyroidism, unspecified: E03.9

## 2012-11-04 HISTORY — DX: Essential (primary) hypertension: I10

## 2012-11-04 HISTORY — DX: Peripheral vascular disease, unspecified: I73.9

## 2012-11-04 HISTORY — DX: Mental disorder, not otherwise specified: F99

## 2012-11-04 HISTORY — DX: Down syndrome, unspecified: Q90.9

## 2012-11-04 HISTORY — DX: Shortness of breath: R06.02

## 2012-11-04 HISTORY — DX: Angioneurotic edema, initial encounter: T78.3XXA

## 2012-11-04 LAB — COMPREHENSIVE METABOLIC PANEL
AST: 13 U/L (ref 0–37)
Albumin: 3.4 g/dL — ABNORMAL LOW (ref 3.5–5.2)
Chloride: 102 mEq/L (ref 96–112)
Creatinine, Ser: 0.94 mg/dL (ref 0.50–1.35)
Potassium: 3.2 mEq/L — ABNORMAL LOW (ref 3.5–5.1)
Sodium: 138 mEq/L (ref 135–145)
Total Bilirubin: 0.6 mg/dL (ref 0.3–1.2)

## 2012-11-04 LAB — CBC
HCT: 46.1 % (ref 39.0–52.0)
Hemoglobin: 15.2 g/dL (ref 13.0–17.0)
MCHC: 33 g/dL (ref 30.0–36.0)

## 2012-11-04 LAB — APTT: aPTT: 31 seconds (ref 24–37)

## 2012-11-04 LAB — GLUCOSE, CAPILLARY
Glucose-Capillary: 140 mg/dL — ABNORMAL HIGH (ref 70–99)
Glucose-Capillary: 153 mg/dL — ABNORMAL HIGH (ref 70–99)

## 2012-11-04 LAB — PHOSPHORUS: Phosphorus: 2.4 mg/dL (ref 2.3–4.6)

## 2012-11-04 LAB — PROTIME-INR: INR: 1.06 (ref 0.00–1.49)

## 2012-11-04 MED ORDER — DIPHENHYDRAMINE HCL 50 MG/ML IJ SOLN
12.5000 mg | Freq: Three times a day (TID) | INTRAMUSCULAR | Status: DC
  Administered 2012-11-04 – 2012-11-06 (×5): 12.5 mg via INTRAVENOUS
  Filled 2012-11-04 (×2): qty 0.25
  Filled 2012-11-04: qty 1
  Filled 2012-11-04 (×3): qty 0.25
  Filled 2012-11-04: qty 1
  Filled 2012-11-04: qty 0.25

## 2012-11-04 MED ORDER — EPINEPHRINE 0.3 MG/0.3ML IJ DEVI
0.3000 mg | Freq: Once | INTRAMUSCULAR | Status: AC
  Administered 2012-11-04: 0.3 mg via INTRAMUSCULAR

## 2012-11-04 MED ORDER — EPINEPHRINE HCL 0.1 MG/ML IJ SOLN: 0.3000 mg | Freq: Once | INTRAMUSCULAR | Status: DC

## 2012-11-04 MED ORDER — INSULIN ASPART 100 UNIT/ML ~~LOC~~ SOLN: 0.0000 [IU] | Freq: Three times a day (TID) | SUBCUTANEOUS | Status: DC

## 2012-11-04 MED ORDER — EPINEPHRINE 0.3 MG/0.3ML IJ DEVI
INTRAMUSCULAR | Status: AC
  Administered 2012-11-04: 0.3 mg via INTRAMUSCULAR
  Filled 2012-11-04: qty 0.3

## 2012-11-04 MED ORDER — FAMOTIDINE IN NACL 20-0.9 MG/50ML-% IV SOLN
20.0000 mg | Freq: Two times a day (BID) | INTRAVENOUS | Status: DC
  Administered 2012-11-04 – 2012-11-06 (×4): 20 mg via INTRAVENOUS
  Filled 2012-11-04 (×8): qty 50

## 2012-11-04 MED ORDER — SODIUM CHLORIDE 0.9 % IV SOLN: INTRAVENOUS | Status: DC

## 2012-11-04 MED ORDER — DIPHENHYDRAMINE HCL 50 MG/ML IJ SOLN: 12.5000 mg | Freq: Three times a day (TID) | INTRAMUSCULAR | Status: DC

## 2012-11-04 MED ORDER — INSULIN ASPART 100 UNIT/ML ~~LOC~~ SOLN
0.0000 [IU] | SUBCUTANEOUS | Status: DC
  Administered 2012-11-04: 2 [IU] via SUBCUTANEOUS
  Administered 2012-11-04 – 2012-11-05 (×2): 3 [IU] via SUBCUTANEOUS
  Administered 2012-11-05 (×2): 2 [IU] via SUBCUTANEOUS

## 2012-11-04 MED ORDER — ALBUTEROL SULFATE (5 MG/ML) 0.5% IN NEBU
5.0000 mg | INHALATION_SOLUTION | Freq: Once | RESPIRATORY_TRACT | Status: AC
  Administered 2012-11-04: 5 mg via RESPIRATORY_TRACT
  Filled 2012-11-04: qty 40

## 2012-11-04 MED ORDER — POTASSIUM CHLORIDE 10 MEQ/100ML IV SOLN
10.0000 meq | INTRAVENOUS | Status: AC
  Administered 2012-11-04 (×2): 10 meq via INTRAVENOUS
  Filled 2012-11-04 (×2): qty 100

## 2012-11-04 MED ORDER — METHYLPREDNISOLONE SODIUM SUCC 125 MG IJ SOLR
80.0000 mg | Freq: Four times a day (QID) | INTRAMUSCULAR | Status: DC
  Administered 2012-11-04 – 2012-11-05 (×3): 80 mg via INTRAVENOUS
  Filled 2012-11-04 (×7): qty 1.28

## 2012-11-04 MED ORDER — DIPHENHYDRAMINE HCL 25 MG PO CAPS: 25.0000 mg | ORAL_CAPSULE | Freq: Three times a day (TID) | ORAL | Status: DC

## 2012-11-04 MED ORDER — HEPARIN SODIUM (PORCINE) 5000 UNIT/ML IJ SOLN
5000.0000 [IU] | Freq: Three times a day (TID) | INTRAMUSCULAR | Status: DC
  Administered 2012-11-04 – 2012-11-07 (×9): 5000 [IU] via SUBCUTANEOUS
  Filled 2012-11-04 (×12): qty 1

## 2012-11-04 MED ORDER — ONDANSETRON HCL 4 MG/2ML IJ SOLN: 4.0000 mg | Freq: Three times a day (TID) | INTRAMUSCULAR | Status: AC | PRN

## 2012-11-04 MED ORDER — SODIUM CHLORIDE 0.9 % IV SOLN
INTRAVENOUS | Status: DC
  Administered 2012-11-04 – 2012-11-05 (×2): via INTRAVENOUS

## 2012-11-04 MED ORDER — SODIUM CHLORIDE 0.9 % IV SOLN: Freq: Once | INTRAVENOUS | Status: DC

## 2012-11-04 NOTE — Progress Notes (Signed)
CBG changed to q4h; SSI changed to q4h;   Benadryl changed to iv

## 2012-11-04 NOTE — ED Provider Notes (Signed)
History     CSN: [id number]  Arrival date & time 11/04/12  1143   First MD Initiated Contact with Patient 11/04/12 1145      Chief Complaint  Patient presents with  . Allergic Reaction    (Consider location/radiation/quality/duration/timing/severity/associated sxs/prior treatment) HPI Comments: HPI Comments: Patient with h/o Down's syndrome presents with onset of tongue swelling this AM. Pt is on lisinopril. History obtained from EMS. Level V caveat -- patient unable to speak due to angioedema, MR, urgent need for intervention. EMS gave solumedrol, zantac, benadryl.   Pt sitting in the room, no respiratory distress, but slightly tachypneic, drooling and a very fine stridulous breath sounds. Tongue protruding out. No hypoxia. He is unable to talk. Gave [place] a mild cough. Nods no when asked about secretion control.  With MR, we gave a breathing tx, and epi IM and ENT and Anesthesia was consulted.  Father arrived few minutes later, states he found patient in current state awake at 9:30am. Symptoms have not improved or worsened since that time. He has had 2 or 3 episodes of minor tongue swelling over the past year that he has not sought treatment for.    Physical Exam  Nursing note and vitals reviewed. Constitutional: He appears well-developed and well-nourished.  HENT:   Head: Normocephalic and atraumatic.  Right Ear: External ear normal.  Left Ear: External ear normal.   Nose: Nose normal.       Marked swelling of tongue. Just enough space for my finger between tongue and roof of mouth. Patient unable to cough well. Slight drooling. No stridor.   Eyes: Conjunctivae normal are normal. Right eye exhibits no discharge. Left eye exhibits no discharge.  Neck: Normal range of motion. Neck supple. No JVD present. No tracheal deviation present.  Cardiovascular: Normal rate, regular rhythm and normal heart sounds.    No murmur heard. Pulmonary/Chest: Effort normal and breath sounds normal. No  respiratory distress. He has no wheezes. He has no rales.  Abdominal: Soft. There is no tenderness.  Musculoskeletal: He exhibits tenderness.       Bilateral LE edema with venous stasis changed.   Neurological: He is alert.  Skin: Skin is warm and dry.  Psychiatric: He has a normal mood and affect.     Patient is a 62 y.o. male presenting with allergic reaction. The history is provided by the patient and a parent.  Allergic Reaction    Past Medical History  Diagnosis Date  . Down's syndrome     History reviewed. No pertinent past surgical history.  History reviewed. No pertinent family history.  History  Substance Use Topics  . Smoking status: Not on file  . Smokeless tobacco: Not on file  . Alcohol Use:       Review of Systems  Unable to perform ROS: Other    Allergies  Review of patient's allergies indicates not on file.  Home Medications  No current outpatient prescriptions on file.  BP 146/73  Pulse 64  Temp 98.1 F (36.7 C) (Axillary)  Resp 22  SpO2 98%  Physical Exam  Nursing note and vitals reviewed. Constitutional:       SEE HPI FOR THE EXAM FINDINGS  Pulmonary/Chest:       See HPI FOR ALL EXAM FINDINGS    ED Course  Procedures (including critical care time)  Labs Reviewed - No data to display No results found.   1. Angioedema       MDM   Pt comes in  with angioedema. Suspected to be related to lisinopril. We had no reliable hx tat arrival, and we have epi IM as well. Anesthesia and ENT were consulted immediately and airway cart was placed at the bedside. It appeared that over the next few minutes - pt didn't get worse, and we had ENT staff come in to reassess as well.  After speaking with the family extensively, the ENT staff and Anesthesia staff, decision was made to not intubate at this time. Our concern was that patient has MR, he never complained of tongue swelling in 1st place, and so it might be better to intubate him as he  would likely not alert [place] if his breathing got worse. Pt also had poor cough and was slightly drooling. ENT staff explains that intubation has its risk and patient has now been stable with the tongue swelling for few hours  - likely not going to decompensate, so better to monitor than elective intubate.  CCM to admit.  CRITICAL CARE Performed by: Derwood Kaplan   Total critical care time: 60 minutes  Critical care time was exclusive of separately billable procedures and treating other patients.  Critical care was necessary to treat or prevent imminent or life-threatening deterioration.  Critical care was time spent personally by me on the following activities: development of treatment plan with patient and/or surrogate as well as nursing, discussions with consultants, evaluation of patient's response to treatment, examination of patient, obtaining history from patient or surrogate, ordering and performing treatments and interventions, ordering and review of laboratory studies, ordering and review of radiographic studies, pulse oximetry and re-evaluation of patient's condition.   Derwood Kaplan, MD 11/04/12 [phone]

## 2012-11-04 NOTE — H&P (Signed)
PULMONARY  / CRITICAL CARE MEDICINE  Name: Eduardo Herman MRN: [id number] DOB: 10/12/1951    LOS: 0  REFERRING MD :  edp  CHIEF COMPLAINT:  Angio edema  LINES / TUBES:  CULTURES:   ANTIBIOTICS:   SIGNIFICANT EVENTS:  1-4 angioedema  LEVEL OF CARE:  icu PRIMARY SERVICE:  pccm CONSULTANTS:  ent CODE STATUS DIET:  npo DVT Px:  hep GI Px:  h2 blockers  HISTORY OF PRESENT ILLNESS:   62yo downs syndrome on lisinopril x 1 year. 3 episodes of tongue swelling. Presented to ED with profound angioedema. Evaluated by ENT. Will admit to ICU in case edema worsens.  PAST MEDICAL HISTORY :  Past Medical History  Diagnosis Date  . Down's syndrome    History reviewed. No pertinent past surgical history. Prior to Admission medications   Not on File   Not on File  FAMILY HISTORY:  History reviewed. No pertinent family history. SOCIAL HISTORY:  does not have a smoking history on file. He does not have any smokeless tobacco history on file. His alcohol and drug histories not on file.  REVIEW OF SYSTEMS:  NA   INTERVAL HISTORY:   VITAL SIGNS: Temp:  [98.1 F (36.7 C)] 98.1 F (36.7 C) (01/04 1150) Pulse Rate:  [56-64] 64  (01/04 1354) Resp:  [17-22] 22  (01/04 1354) BP: (146-173)/(73-92) 146/73 mmHg (01/04 1354) SpO2:  [97 %-100 %] 98 % (01/04 1354) HEMODYNAMICS:   VENTILATOR SETTINGS:   INTAKE / OUTPUT: Intake/Output    None     PHYSICAL EXAMINATION: General: MOWM NAD Neuro:  Intact for him HEENT:  Lingual edema, able to swallow and phonate, primary tongue only sewelling Cardiovascular: hsr rrr Lungs:  cta Abdomen:  Obese +bs Musculoskeletal:  Intact Skin:  Lower ext with chronic edema   LABS: Cbc No results found for this basename: WBC:,HGB:3,HCT:3,PLT:3 in the last 168 hours  Chemistry  No results found for this basename: NA:3,K:3,CL:3,CO2:3,BUN:3,CREATININE:3,CALCIUM:3,MG:3,PHOS:3,GLUCOSE:3 in the last 168 hours  Liver fxn No results found for this  basename: AST:3,ALT:3,ALKPHOS:3,BILITOT:3,PROT:3,ALBUMIN:3 in the last 168 hours coags No results found for this basename: APTT:3,INR:3 in the last 168 hours Sepsis markers No results found for this basename: LATICACIDVEN:3,PROCALCITON:3 in the last 168 hours Cardiac markers No results found for this basename: CKTOTAL:3,CKMB:3,TROPONINI:3 in the last 168 hours BNP No results found for this basename: PROBNP:3 in the last 168 hours ABG No results found for this basename: PHART:3,PCO2ART:3,PO2ART:3,HCO3:3,TCO2:3 in the last 168 hours  CBG trend No results found for this basename: GLUCAP:5 in the last 168 hours  IMAGING: No results found.  ECG:  DIAGNOSES: Active Problems:  Benign carcinoid tumor of the duodenum  MENTAL RETARDATION  Angioedema  Edema, lower extremity   ASSESSMENT / PLAN:  PULMONARY osa angioedema ASSESSMENT: Not in acute resp distress despite angio edema. Scoped by ENT. R/o edema PLAN:   -admit to icu -ent following, appears as isolated to tongue, could also consider oral airway if needed as post wall not edema etc or larynx -h2 blocker,steroids,antihistamines -intubation if worsens, observe if able  CARDIOVASCULAR  ASSESSMENT:  HTN PLAN:  -monitor -stop ace-i forever -may need lasix, repeat pcxr for edema, if hypoxia, add lasix -kvo  RENAL Lab Results  Component Value Date   CREATININE 2.57* 06/04/2011   CREATININE 2.98* 06/03/2011   CREATININE 3.53* 06/02/2011    ASSESSMENT:  Renal Insuff, hypioK, r;o edema pulm PLAN:   kvo k supp Chem in am including mag, phos  GASTROINTESTINAL  ASSESSMENT:  MO PLAN:   Npo x meds sips pepcid  HEMATOLOGIC  ASSESSMENT:  No acute issue PLAN:  Sub q hep Cbc to follow in am   INFECTIOUS  ASSESSMENT:  No acute issue PLAN:   Monitor fever curve  ENDOCRINE  ASSESSMENT:   DM  PLAN:   -ssi on steroids  NEUROLOGIC  ASSESSMENT:  Downs syndrome PLAN:   -no intervention needed, avoid  narcs / benzo  CLINICAL SUMMARY: 62 yo obese male with angioedema from presumed ace-i.  I have personally obtained a history, examined the patient, evaluated laboratory and imaging results, formulated the assessment and plan and placed orders. CRITICAL CARE: The patient is critically ill with multiple organ systems failure and requires high complexity decision making for assessment and support, frequent evaluation and titration of therapies, application of advanced monitoring technologies and extensive interpretation of multiple databases.  Mcarthur Rossetti. Tyson Alias, MD, FACP Pgr: [phone] [facility]

## 2012-11-04 NOTE — ED Provider Notes (Signed)
History     CSN: [id number]  Arrival date & time 11/04/12  1143   First MD Initiated Contact with Patient 11/04/12 1145      Chief Complaint  Patient presents with  . Allergic Reaction    (Consider location/radiation/quality/duration/timing/severity/associated sxs/prior treatment) HPI Comments: Patient with h/o Down's syndrome presents with onset of tongue swelling this AM. Pt is on lisinopril. History obtained from EMS. Level V caveat -- patient unable to speak due to angioedema, MR, urgent need for intervention. EMS gave solumedrol, zantac, benadryl.   Father arrived, states he found patient in current state awake at 9:30am. Symptoms have not improved or worsened since that time. He has had 2 or 3 episodes of minor tongue swelling over the past year that he has not sought treatment for.   Patient is a 62 y.o. male presenting with allergic reaction. The history is provided by the patient.  Allergic Reaction    Past Medical History  Diagnosis Date  . Down's syndrome     History reviewed. No pertinent past surgical history.  History reviewed. No pertinent family history.  History  Substance Use Topics  . Smoking status: Not on file  . Smokeless tobacco: Not on file  . Alcohol Use:       Review of Systems  Unable to perform ROS: Other    Allergies  Review of patient's allergies indicates not on file.  Home Medications  No current outpatient prescriptions on file.  BP 166/81  Pulse 56  Temp 98.1 F (36.7 C) (Axillary)  Resp 17  SpO2 100%  Physical Exam  Nursing note and vitals reviewed. Constitutional: He appears well-developed and well-nourished.  HENT:  Head: Normocephalic and atraumatic.  Right Ear: External ear normal.  Left Ear: External ear normal.  Nose: Nose normal.       Marked swelling of tongue. Just enough space for my finger between tongue and roof of mouth. Patient unable to cough well. Slight drooling. No stridor.   Eyes: Conjunctivae  normal are normal. Right eye exhibits no discharge. Left eye exhibits no discharge.  Neck: Normal range of motion. Neck supple. No JVD present. No tracheal deviation present.  Cardiovascular: Normal rate, regular rhythm and normal heart sounds.   No murmur heard. Pulmonary/Chest: Effort normal and breath sounds normal. No respiratory distress. He has no wheezes. He has no rales.  Abdominal: Soft. There is no tenderness.  Musculoskeletal: He exhibits tenderness.       Bilateral LE edema with venous stasis changed.   Neurological: He is alert.  Skin: Skin is warm and dry.  Psychiatric: He has a normal mood and affect.    ED Course  Procedures (including critical care time)  Labs Reviewed  CBC - Abnormal; Notable for the following:    RDW 15.8 (*)     All other components within normal limits  COMPREHENSIVE METABOLIC PANEL  MAGNESIUM  PHOSPHORUS  PROTIME-INR  APTT  HEMOGLOBIN A1C   Dg Chest Portable 1 View  11/04/2012  *RADIOLOGY REPORT*  Clinical Data: Swollen tongue and allergic reaction.  PORTABLE CHEST - 1 VIEW  Comparison: Chest radiograph 06/01/2011  Findings: Single portable view of the chest was obtained.  There are low lung volumes with prominent interstitial markings.  Heart size is stable and within normal limits.  No focal airspace disease.  IMPRESSION: Low lung volumes and suspect mild pulmonary edema.   Original Report Authenticated By: Richarda Overlie, M.D.      1. Angioedema  11:53 AM Patient seen and examined.   Vital signs reviewed and are as follows: Filed Vitals:   11/04/12 1150  BP: 166/81  Pulse: 56  Temp: 98.1 F (36.7 C)  Resp: 17   Seen immediately with Dr. Rhunette Croft. Airway cart to bedside. Placed on 02. Epi given. Albuterol ordered. Anesthesia and ENT paged. Patient appears stable.   1:02 PM Dr. Jearld Fenton has seen in ED. Would like to watch in ICU given stability over the past several hours. Will ask PCCM to admit. Dr. Tyson Alias will admit.   3:03 PM  Re-exam unchanged. PCCM has seen patient.    MDM  Admit for close airway monitoring. Angioedema is likely ACE induced.      Renne Crigler, [place] 11/04/12 1505

## 2012-11-04 NOTE — Consult Note (Signed)
Reason for Consult: Angioedema Referring Physician: Emergency room  Eduardo Herman is an 62 y.o. male.  HPI: 62 year old who has Down syndrome has had swelling in his oral cavity since this morning at 9:30 when the father first saw him. It is uncertain if the problem was present prior to 9:30. The patient has not been in any distress through this episode. He has had previous swelling of his tongue about 3 weeks ago which was not as bad and resolved spontaneously. The patient does take an ACE inhibitor. He does have shortness of breath issues on a chronic basis with dyspnea on exertion. He has to stop walking to the mailbox. There is apparently no stridor associated with this. The father states that his primary care doctor does know about this problem. The patient has been in the emergency room and received the usual angioedema medication and does have a slight improvement in the swelling of the tongue. Past Medical History  Diagnosis Date  . Down's syndrome     History reviewed. No pertinent past surgical history.  History reviewed. No pertinent family history.  Social History:  does not have a smoking history on file. He does not have any smokeless tobacco history on file. His alcohol and drug histories not on file.  Allergies: Not on File  Medications: I have reviewed the patient's current medications.  No results found for this or any previous visit (from the past 48 hour(s)).  No results found.  Review of Systems  Constitutional: Negative.   HENT: Negative.   Eyes: Negative.   Skin: Negative.    Blood pressure 171/80, pulse 57, temperature 98.1 F (36.7 C), temperature source Axillary, resp. rate 22, SpO2 99.00%. Physical Exam  Constitutional: He appears well-developed.  HENT:  Head: Normocephalic and atraumatic.       He is not having any stridor. Nose is clear without evidence of swelling or exudate. Nasopharynx is also without swelling. The fiberoptic exam looking at the  palate from posterior has a very mild subtle edema of the uvula. There is a very subtle amount of edema on both lateral pharyngeal walls the watery angioedema type. The epiglottis and glottis are normal appearing with no evidence of any swelling. Both vocal cords appear to move normally. The tongue is swollen to the point of beyond the edge of the alveolus. He is edentulous. Opening his mouth he does seem to open well with no trismus. I can see the top of the soft palate. The tongue does not reach the roof of the mouth. There doesn't appear to be any tenderness to the tongue.  Eyes: Conjunctivae normal and EOM are normal.  Neck:       The neck has very loose pannus-like tissue in the midline but no tenderness or swelling. I can palpate his laryngeal cartilages easily and there are slightly low. His hyoid bone feels normal as well as the cricoid.  Cardiovascular: Normal rate.     Assessment/Plan: Angioedema-his tongue is swollen but it does not appear that his palate or larynx has any significant swelling. The patient is comfortable with no stridor and never has had stridor. I gave the father who is the guardian options regarding treatment and he could be electively intubated. This does come with a risk of creating an airway issue that is currently not present and this would be done in the operating room for control. This could result in a emergency tracheotomy. Observation also an option and this also could result in difficulty  if his swelling does suddenly rapidly progress. Given the at least 3 hour time frame since onset and slight improvement with no significant oral pharyngeal or laryngeal swelling, observation may be the best course of action with intensive care unit care. The father does prefer this course of action. Critical care medicine will admit him to the intensive care unit for close observation and I will consult. Obviously the patient needs to be removed from his ACE inhibitor now and in the  future.  Suzanna Obey 11/04/2012, 1:03 PM

## 2012-11-04 NOTE — ED Notes (Signed)
Pt presents to ED with c/o allergic reaction. Facial swelling. Airway intact. EMS placed PIV, gave 125 mg solumed, 50mg  zantac IV, 50mg  IV benadryl.Marland KitchenMarland Kitchen

## 2012-11-05 ENCOUNTER — Encounter (HOSPITAL_COMMUNITY): Payer: Self-pay | Admitting: Internal Medicine

## 2012-11-05 ENCOUNTER — Inpatient Hospital Stay (HOSPITAL_COMMUNITY): Payer: Medicare Other

## 2012-11-05 DIAGNOSIS — M7989 Other specified soft tissue disorders: Secondary | ICD-10-CM

## 2012-11-05 LAB — CBC
Hemoglobin: 14.4 g/dL (ref 13.0–17.0)
MCH: 27.2 pg (ref 26.0–34.0)
RBC: 5.3 MIL/uL (ref 4.22–5.81)
WBC: 9.4 10*3/uL (ref 4.0–10.5)

## 2012-11-05 LAB — PHOSPHORUS: Phosphorus: 2.6 mg/dL (ref 2.3–4.6)

## 2012-11-05 LAB — BLOOD GAS, ARTERIAL
Bicarbonate: 22.3 mEq/L (ref 20.0–24.0)
FIO2: 0.21 %
TCO2: 23.3 mmol/L (ref 0–100)
pCO2 arterial: 33.5 mmHg — ABNORMAL LOW (ref 35.0–45.0)
pH, Arterial: 7.437 (ref 7.350–7.450)
pO2, Arterial: 86.2 mmHg (ref 80.0–100.0)

## 2012-11-05 LAB — HEMOGLOBIN A1C
Hgb A1c MFr Bld: 5.8 % — ABNORMAL HIGH (ref <5.7)
Mean Plasma Glucose: 120 mg/dL — ABNORMAL HIGH (ref <117)

## 2012-11-05 LAB — BASIC METABOLIC PANEL
CO2: 22 mEq/L (ref 19–32)
Chloride: 105 mEq/L (ref 96–112)
Glucose, Bld: 151 mg/dL — ABNORMAL HIGH (ref 70–99)
Potassium: 3.4 mEq/L — ABNORMAL LOW (ref 3.5–5.1)
Sodium: 139 mEq/L (ref 135–145)

## 2012-11-05 LAB — GLUCOSE, CAPILLARY
Glucose-Capillary: 136 mg/dL — ABNORMAL HIGH (ref 70–99)
Glucose-Capillary: 128 mg/dL — ABNORMAL HIGH (ref 70–99)

## 2012-11-05 MED ORDER — METHYLPREDNISOLONE SODIUM SUCC 40 MG IJ SOLR
40.0000 mg | Freq: Two times a day (BID) | INTRAMUSCULAR | Status: DC
  Administered 2012-11-05 – 2012-11-06 (×2): 40 mg via INTRAVENOUS
  Filled 2012-11-05 (×5): qty 1

## 2012-11-05 MED ORDER — POTASSIUM CHLORIDE 10 MEQ/100ML IV SOLN
10.0000 meq | INTRAVENOUS | Status: AC
  Administered 2012-11-05 (×4): 10 meq via INTRAVENOUS
  Filled 2012-11-05 (×4): qty 100

## 2012-11-05 MED ORDER — INSULIN ASPART 100 UNIT/ML ~~LOC~~ SOLN
0.0000 [IU] | Freq: Three times a day (TID) | SUBCUTANEOUS | Status: DC
  Administered 2012-11-05 (×2): 3 [IU] via SUBCUTANEOUS
  Administered 2012-11-06 (×3): 4 [IU] via SUBCUTANEOUS
  Administered 2012-11-07: 3 [IU] via SUBCUTANEOUS

## 2012-11-05 MED ORDER — HYDRALAZINE HCL 20 MG/ML IJ SOLN
10.0000 mg | INTRAMUSCULAR | Status: DC | PRN
  Filled 2012-11-05: qty 0.5

## 2012-11-05 NOTE — Progress Notes (Signed)
Patient is doing well. He is now talking with a little bit of dysarthria. It's not clear what his baseline is. His tongue is substantially decreased with no evidence of other swelling. He appears to be resolving his angioedema. He is going to be transferred to the floor. Call if any further issues or problems.

## 2012-11-05 NOTE — Progress Notes (Signed)
PULMONARY  / CRITICAL CARE MEDICINE  Name: Eduardo Herman MRN: [id number] DOB: Apr 28, 1951    LOS: 1  REFERRING MD :  edp  CHIEF COMPLAINT:  Angio edema  LINES / TUBES: None  CULTURES: None  ANTIBIOTICS: None  SIGNIFICANT EVENTS:  1-4:  Angioedema without   LEVEL OF CARE:  icu PRIMARY SERVICE:  p[facility]m CONSULTANTS:  ent CODE STATUS DIET:  Full liquid DVT Px:  hep GI Px:  h2 blockers  HISTORY OF PRESENT ILLNESS:   62 yo Downs syndrome man on lisinopril x 1 year. 3 episodes of tongue swelling. Presented to ED with profound angioedema. Evaluated by ENT. Admitted to ICU in case edema worsens.  INTERVAL HISTORY:  No difficulty overnight.  Urinary incontinence which is his baseline.    VITAL SIGNS: Temp:  [97.4 F (36.3 C)-99.5 F (37.5 C)] 98.3 F (36.8 C) (01/05 0400) Pulse Rate:  [56-79] 61  (01/05 0100) Resp:  [12-25] 19  (01/05 0700) BP: (146-186)/(67-99) 164/95 mmHg (01/05 0700) SpO2:  [94 %-100 %] 97 % (01/05 0400) Weight:  [288 lb 5.8 oz (130.8 kg)-290 lb 2 oz (131.6 kg)] 288 lb 5.8 oz (130.8 kg) (01/05 0700)  INTAKE / OUTPUT: Intake/Output      01/04 0701 - 01/05 0700 01/05 0701 - 01/06 0700   I.V. (mL/kg) 940 (7.2)    IV Piggyback 250    Total Intake(mL/kg) 1190 (9.1)    Net +1190         Urine Occurrence 7 x     PHYSICAL EXAMINATION: General: NAD, denies pain,  Neuro:  Moving all 4, speech mildly garbled secondary to tongue swelling.  HEENT:  able to swallow and phonate, tongue swelling improved but still mildly protrusion. Able to visualize posterior pharynx.  Cardiovascular: RRR, no MRG Lungs:  CTAB  Abdomen:  Obese +bs Musculoskeletal:  Intact Skin:  1+ pitting edema to bilateral lower extremities.  Lower ext with chronic edema  LABS: Cbc  Lab 11/05/12 0515 11/04/12 1435  WBC 9.4 --  HGB 14.4 15.2  HCT 43.9 46.1  PLT 262 243   Chemistry  Lab 11/05/12 0515 11/04/12 1435  NA 139 138  K 3.4* 3.2*  CL 105 102  CO2 22 23  BUN 17 15    CREATININE 0.86 0.94  CALCIUM 8.9 8.9  MG 2.1 2.0  PHOS 2.6 2.4  GLUCOSE 151* 147*   Liver fxn  Lab 11/04/12 1435  AST 13  ALT 7  ALKPHOS 95  BILITOT 0.6  PROT 7.9  ALBUMIN 3.4*   coags  Lab 11/04/12 1435  APTT 31  INR 1.06   ABG  Lab 11/05/12 0430  PHART 7.437  PCO2ART 33.5*  PO2ART 86.2  HCO3 22.3  TCO2 23.3   CBG trend  Lab 11/05/12 0424 11/04/12 2326 11/04/12 2105 11/04/12 1651  GLUCAP 134* 125* 153* 140*   IMAGING: Dg Chest Port 1 View  11/05/2012  *RADIOLOGY REPORT*  Clinical Data: Edema, follow-up, history hypertension, Down syndrome  PORTABLE CHEST - 1 VIEW  Comparison: Portable exam 0519 hours compared to 11/04/2012  Findings: Enlargement of cardiac silhouette with pulmonary vascular congestion. Perihilar infiltrates likely pulmonary edema, little changed. No gross pleural effusion or pneumothorax. Rotated to the left. Bones unremarkable.  IMPRESSION: Mild pulmonary edema, little changed.   Original Report Authenticated By: Ulyses Southward, M.D.    Dg Chest Portable 1 View  11/04/2012  *RADIOLOGY REPORT*  Clinical Data: Swollen tongue and allergic reaction.  PORTABLE CHEST - 1 VIEW  Comparison: Chest radiograph 06/01/2011  Findings: Single portable view of the chest was obtained.  There are low lung volumes with prominent interstitial markings.  Heart size is stable and within normal limits.  No focal airspace disease.  IMPRESSION: Low lung volumes and suspect mild pulmonary edema.   Original Report Authenticated By: Richarda Overlie, M.D.    DIAGNOSES: Principal Problem:  *Angioedema Active Problems:  Benign carcinoid tumor of the duodenum  MENTAL RETARDATION  Edema, lower extremity  ASSESSMENT / PLAN:  PULMONARY  ASSESSMENT: OSA Angioedema of the tongue.  Likely secondary to ACE inhibitor >> much improved 1/05. Not in acute resp distress despite angio edema. Scoped by ENT. PLAN:   -ent following, appears as isolated to tongue and appears to be improving.   -h2 blocker,steroids,antihistamines  CARDIOVASCULAR  ASSESSMENT:  HTN:  Will see if he can resume oral medications 1/05 and if so will restart his home medications.  PLAN:  -monitor -stop ace-i forever -NSL  RENAL Lab Results  Component Value Date   CREATININE 0.86 11/05/2012   CREATININE 0.94 11/04/2012   CREATININE 2.57* 06/04/2011   ASSESSMENT:  Renal Insuff, hypoK,  PLAN:   NSL Replete K IV for now.  Follow Bmet 1/06.   GASTROINTESTINAL  ASSESSMENT:   MO PLAN:   Advance diet as tolerated pepcid  HEMATOLOGIC  ASSESSMENT:  No acute issue PLAN:  Sub q hep  INFECTIOUS  ASSESSMENT:  No acute issue PLAN:   Monitor fever curve  ENDOCRINE  ASSESSMENT:   DM  PLAN:   -ssi while on steroids  NEUROLOGIC  ASSESSMENT:  Downs syndrome PLAN:   -no intervention needed, avoid narcs / benzo  CLINICAL SUMMARY: 62 yo obese male with angioedema from presumed ace-i, improving on supportive care.  Plan to try ice chips and sips with meds 1/05.  If he tolerates well we can advance to clear liquid or full liquid diet.  Eduardo Herman,CHRISTOPHER, MD Internal Medicine Resident, PGY III Chief Resident, Internal Medicine Pager: [phone] 11/05/2012 7:51 AM   I have personally obtained a history, examined the patient, evaluated laboratory and imaging results, formulated the assessment and plan and placed orders.  Edema improved.  Will advance diet as tolerated.  Continue solumedrol, pepcid, benadryl.  Transfer to medical floor bed non-tele.  Sitter at bedside due to fall risk.  If he continues to improve, then likely d/c home in next 24 to 48 hrs.  Eduardo Helling, MD [facility] Pulmonary/Critical Care 11/05/2012, 9:56 AM Pager:  [phone] After 3pm call: [phone]

## 2012-11-05 NOTE — Progress Notes (Signed)
eLink Physician-Brief Progress Note Patient Name: Eduardo Herman DOB: [birth year] MRN: [id number]  Date of Service  11/05/2012   HPI/Events of Note   hypokalemia  eICU Interventions  Potassium replaced   Intervention Category Minor Interventions: Electrolytes abnormality - evaluation and management  [name] 11/05/2012, 6:43 AM

## 2012-11-05 NOTE — ED Provider Notes (Signed)
Medical screening examination/treatment/procedure(s) were conducted as a shared visit with non-physician practitioner(s) and myself.  I personally evaluated the patient during the encounter  SEPARATE NOTE PLACED AS WELL  Derwood Kaplan, MD 11/05/12 1205

## 2012-11-05 NOTE — Plan of Care (Signed)
Problem: Consults Goal: General Medical Patient Education See Patient Education Module for specific education. Outcome: Progressing Angio edema

## 2012-11-05 NOTE — Progress Notes (Signed)
*  Preliminary Results* Bilateral lower extremity venous duplex completed. Visualized veins of bilateral lower extremities are negative for deep vein thrombosis. Unable to visualize right posterior tibial, bilateral peroneal, and left distal femoral veins, therefore cannot assess these veins for deep vein thrombosis. Preliminary results discussed with Daniel Nones, RN.  11/05/2012 9:38 AM Gertie Fey, RDMS, RDCS

## 2012-11-06 LAB — BASIC METABOLIC PANEL
Calcium: 8.9 mg/dL (ref 8.4–10.5)
Creatinine, Ser: 0.85 mg/dL (ref 0.50–1.35)
GFR calc Af Amer: >90 mL/min (ref >90)

## 2012-11-06 LAB — GLUCOSE, CAPILLARY: Glucose-Capillary: 169 mg/dL — ABNORMAL HIGH (ref 70–99)

## 2012-11-06 LAB — CBC
MCHC: 32.7 g/dL (ref 30.0–36.0)
MCV: 83.1 fL (ref 78.0–100.0)
Platelets: 272 10*3/uL (ref 150–400)
RDW: 16 % — ABNORMAL HIGH (ref 11.5–15.5)
WBC: 15.2 10*3/uL — ABNORMAL HIGH (ref 4.0–10.5)

## 2012-11-06 MED ORDER — DILTIAZEM HCL ER COATED BEADS 360 MG PO CP24
360.0000 mg | ORAL_CAPSULE | Freq: Every day | ORAL | Status: DC
  Administered 2012-11-06 – 2012-11-07 (×2): 360 mg via ORAL
  Filled 2012-11-06 (×2): qty 1

## 2012-11-06 MED ORDER — ATENOLOL 50 MG PO TABS
50.0000 mg | ORAL_TABLET | Freq: Every day | ORAL | Status: DC
  Administered 2012-11-06 – 2012-11-07 (×2): 50 mg via ORAL
  Filled 2012-11-06 (×2): qty 1

## 2012-11-06 MED ORDER — FAMOTIDINE 20 MG PO TABS
20.0000 mg | ORAL_TABLET | Freq: Two times a day (BID) | ORAL | Status: DC
  Administered 2012-11-06 – 2012-11-07 (×2): 20 mg via ORAL
  Filled 2012-11-06 (×4): qty 1

## 2012-11-06 MED ORDER — PREDNISONE 20 MG PO TABS
40.0000 mg | ORAL_TABLET | Freq: Every day | ORAL | Status: DC
Start: 2012-11-07 — End: 2012-11-07
  Administered 2012-11-07: 40 mg via ORAL
  Filled 2012-11-06 (×2): qty 2

## 2012-11-06 MED ORDER — DILTIAZEM HCL ER BEADS 240 MG PO CP24: 360.0000 mg | ORAL_CAPSULE | Freq: Every day | ORAL | Status: DC

## 2012-11-06 MED ORDER — FUROSEMIDE 80 MG PO TABS
80.0000 mg | ORAL_TABLET | Freq: Every day | ORAL | Status: DC
  Administered 2012-11-06 – 2012-11-07 (×2): 80 mg via ORAL
  Filled 2012-11-06 (×2): qty 1

## 2012-11-06 MED ORDER — LEVOTHYROXINE SODIUM 200 MCG PO TABS
200.0000 ug | ORAL_TABLET | Freq: Every day | ORAL | Status: DC
  Administered 2012-11-06 – 2012-11-07 (×2): 200 ug via ORAL
  Filled 2012-11-06 (×3): qty 1

## 2012-11-06 NOTE — Progress Notes (Signed)
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 3 episodes of tongue swelling. Presented to ED with profound angioedema. Evaluated by ENT.   INTERVAL HISTORY: denies cp, dyspnea Able to tolerate liquids BP high  VITAL SIGNS: Temp:  [97.8 F (36.6 C)-98.3 F (36.8 C)] 97.8 F (36.6 C) (01/06 1008) Pulse Rate:  [50-80] 50  (01/06 1008) Resp:  [16-20] 18  (01/06 1008) BP: (164-183)/(86-92) 174/91 mmHg (01/06 1008) SpO2:  [97 %-100 %] 97 % (01/06 1008) HEMODYNAMICS:   VENTILATOR SETTINGS:   INTAKE / OUTPUT: Intake/Output      01/05 0701 - 01/06 0700 01/06 0701 - 01/07 0700   P.O. 950 240   I.V. (mL/kg)     IV Piggyback 400    Total Intake(mL/kg) 1350 (10.3) 240 (1.8)   Net +1350 +240        Urine Occurrence 4 x 1 x     PHYSICAL EXAMINATION: General: MOWM NAD Neuro:  Intact for him HEENT: no  Lingual edema, able to swallow and phonate Cardiovascular: hsr rrr Lungs:  cta Abdomen:  Obese +bs Musculoskeletal:  Intact Skin:  Lower ext with chronic edema   LABS: Cbc  Lab 11/06/12 0627 11/05/12 0515 11/04/12 1435  WBC 15.2* -- --  HGB 14.9 14.4 15.2  HCT 45.6 43.9 46.1  PLT 272 262 243    Chemistry   Lab 11/06/12 0627 11/05/12 0515 11/04/12 1435  NA 141 139 138  K 3.8 3.4* 3.2*  CL 105 105 102  CO2 24 22 23   BUN 21 17 15   CREATININE 0.85 0.86 0.94  CALCIUM 8.9 8.9 8.9  MG -- 2.1 2.0  PHOS -- 2.6 2.4  GLUCOSE 154* 151* 147*    Liver fxn  Lab 11/04/12 1435  AST 13  ALT 7  ALKPHOS 95  BILITOT 0.6  PROT 7.9  ALBUMIN 3.4*   coags  Lab 11/04/12 1435  APTT 31  INR 1.06   Sepsis markers No results found for this basename: LATICACIDVEN:3,PROCALCITON:3 in the last 168 hours Cardiac markers No results found for this  basename: CKTOTAL:3,CKMB:3,TROPONINI:3 in the last 168 hours BNP No results found for this basename: PROBNP:3 in the last 168 hours ABG  Lab 11/05/12 0430  PHART 7.437  PCO2ART 33.5*  PO2ART 86.2  HCO3 22.3  TCO2 23.3    CBG trend  Lab 11/06/12 1158 11/06/12 0748 11/05/12 2156 11/05/12 1734 11/05/12 1138  GLUCAP 155* 199* 128* 156* 136*    IMAGING: Dg Chest Port 1 View  11/05/2012  *RADIOLOGY REPORT*  Clinical Data: Edema, follow-up, history hypertension, Down syndrome  PORTABLE CHEST - 1 VIEW  Comparison: Portable exam 0519 hours compared to 11/04/2012  Findings: Enlargement of cardiac silhouette with pulmonary vascular congestion. Perihilar infiltrates likely pulmonary edema, little changed. No gross pleural effusion or pneumothorax. Rotated to the left. Bones unremarkable.  IMPRESSION: Mild pulmonary edema, little changed.   Original Report Authenticated By: Ulyses Southward, M.D.    Dg Chest Portable 1 View  11/04/2012  *RADIOLOGY REPORT*  Clinical Data: Swollen tongue and allergic reaction.  PORTABLE CHEST - 1 VIEW  Comparison: Chest radiograph 06/01/2011  Findings: Single portable view of the chest was obtained.  There are low lung volumes with prominent interstitial  markings.  Heart size is stable and within normal limits.  No focal airspace disease.  IMPRESSION: Low lung volumes and suspect mild pulmonary edema.   Original Report Authenticated By: Richarda Overlie, M.D.     ECG:  DIAGNOSES: Principal Problem:  *Angioedema Active Problems:  Benign carcinoid tumor of the duodenum  MENTAL RETARDATION  Edema, lower extremity   ASSESSMENT / PLAN:  PULMONARY osa angioedema ASSESSMENT: Not in acute resp distress despite angio edema. Scoped by ENT - resolved  PLAN:   -h2 blocker, ct PO steroids- taper rapidly over 3 ds,  dc antihistamines -advance PO  CARDIOVASCULAR  ASSESSMENT:  HTN PLAN:  -monitor -stop ace-i forever -resume atenolol 50 , cardizem,  lasix -kvo  RENAL Lab Results  Component Value Date   CREATININE 0.85 11/06/2012   CREATININE 0.86 11/05/2012   CREATININE 0.94 11/04/2012    ASSESSMENT:  Renal Insuff, hypioK, r;o edema pulm PLAN:   kvo  mag, phos ok  HEMATOLOGIC  ASSESSMENT:  No acute issue PLAN:  Sub q hep    ENDOCRINE  ASSESSMENT:   DM  PLAN:   -ssi on steroids  NEUROLOGIC  ASSESSMENT:  Downs syndrome PLAN:   -no intervention needed, avoid narcs / benzo ? Sleep study as out pt  Hope to dc in 24h once BP better & can take solids  ALVA,RAKESH V. [phone]

## 2012-11-07 LAB — BASIC METABOLIC PANEL
BUN: 23 mg/dL (ref 6–23)
GFR calc non Af Amer: 90 mL/min — ABNORMAL LOW (ref >90)
Glucose, Bld: 141 mg/dL — ABNORMAL HIGH (ref 70–99)
Potassium: 3.3 mEq/L — ABNORMAL LOW (ref 3.5–5.1)

## 2012-11-07 LAB — GLUCOSE, CAPILLARY: Glucose-Capillary: 143 mg/dL — ABNORMAL HIGH (ref 70–99)

## 2012-11-07 LAB — CBC
HCT: 45 % (ref 39.0–52.0)
Hemoglobin: 14.6 g/dL (ref 13.0–17.0)
MCH: 27 pg (ref 26.0–34.0)
MCHC: 32.4 g/dL (ref 30.0–36.0)
MCV: 83.2 fL (ref 78.0–100.0)

## 2012-11-07 MED ORDER — PREDNISONE 20 MG PO TABS: ORAL_TABLET | ORAL | Status: DC

## 2012-11-07 MED ORDER — POTASSIUM CHLORIDE CRYS ER 20 MEQ PO TBCR
40.0000 meq | EXTENDED_RELEASE_TABLET | Freq: Once | ORAL | Status: AC
  Administered 2012-11-07: 40 meq via ORAL
  Filled 2012-11-07: qty 2

## 2012-11-07 NOTE — Progress Notes (Signed)
BP better WC coming down- ? steroid related Potassium repleted OK for dc  Needs appt with PCP in 1-2 weeks for  BP fu  Eduardo Herman V.

## 2012-11-07 NOTE — Discharge Summary (Signed)
Physician Discharge Summary  Patient ID: DONTARIOUS SCHAUM MRN: [id number] DOB/AGE: Dec 06, 1950 62 y.o.  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 y/o male with a PMH of downs syndrome on lisinopril x 1 year. 3 episodes of tongue swelling. Presented to ED with profound angioedema. He was treated with IV benadryl, pepcid and solu-medrol with some improvement in swelling.  He was evaluated by ENT and family opted for close observation in ICU.  He did not require intubation.  Swelling resolved over course of admission.  Consults: ENT - Dr. [facility] Summary by Discharge Diagnosis  Angioedema ACE Inhibitor Anaphylaxis Hypertension Hx of HTN on Lisinopril for approximately one year prior to presentation.  Patient has apparently had episodes similar to this in past.  He presented on 1/4 with significant tongue / oral swelling and was treated with IV benadryl, pepcid and solu-medrol with some improvement.  He was evaluated by ENT and was noted not to have stridor.  Fiberoptic exam demonstrated subtle edema of uvula, both lateral pharyngeal walls and tongue swollen to the point of beyond the edge of the alveolus.  Normal vocal cord movement.   He was admitted to ICU for for close observation and fortunately did not require intubation.  ACE inhibitor was stopped and swelling resolved over course of admission.   Discharge Plan: -Stop ACE Inhibitor -continue atenolol, diltiazem and lasix -follow up with PCP next week for HTN review and further management of BP -quick taper of prednisone to off over the next 3 days  Hypothyroidism No changes made during this admit.  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   Follow up with PCP as below.     Discharge Exam: General: MOWM NAD  Neuro: Intact for him  HEENT: no Lingual edema, able to swallow and phonate  Cardiovascular: hsr rrr  Lungs: cta  Abdomen: Obese +bs  Musculoskeletal: Intact  Skin: Lower ext with chronic edema   Filed Vitals:   11/06/12 1721 11/06/12 2118 11/07/12 0120 11/07/12 0520  BP: 170/89 165/86 157/81 157/76  Pulse: 55 57 54 74  Temp: 97.9 F (36.6 C) 98.2 F (36.8 C) 98 F (36.7 C) 96.8 F (36 C)  TempSrc: Oral Oral Oral Oral  Resp: 20 20 18 20   Height:      Weight:      SpO2: 100% 92% 98% 98%     Discharge Labs  BMET  Lab 11/07/12 0742 11/06/12 0627 11/05/12 0515 11/04/12 1435  NA 139 141 139 138  K 3.3* 3.8 -- --  CL 102 105 105 102  CO2 26 24 22 23   GLUCOSE 141* 154* 151* 147*  BUN 23 21 17 15   CREATININE 0.87 0.85 0.86 0.94  CALCIUM 8.5 8.9 8.9 8.9  MG -- -- 2.1 2.0  PHOS -- -- 2.6 2.4   CBC  Lab 11/07/12 0742 11/06/12 0627 11/05/12 0515  HGB 14.6 14.9 14.4  HCT 45.0 45.6 43.9  WBC 12.4* 15.2* 9.4  PLT 246 272 262   Anti-Coagulation  Lab 11/04/12 1435  INR 1.06   Discharge Orders    Future Orders Please Complete By Expires   Diet - low sodium heart healthy      Increase activity slowly      Discharge instructions      Comments:   NO FURTHER ACE INHIBITORS.  Do not take you vasotec (enalapril). This should be added to your allergy list.   Call MD for:  temperature >100.4      Call MD for:  difficulty breathing, headache or visual disturbances      Call MD for:  persistant dizziness or light-headedness      Call MD for:  extreme fatigue         Follow-up Information    Follow up with Nadean Corwin, MD. On 11/15/2012. (Appt at 1:45 pm)    Contact information:   [address] [place] [address] [phone] Fax:  [phone]            Medication List     As of 11/07/2012 11:24 AM    START taking these medications         predniSONE 20 MG tablet   Commonly known as: DELTASONE   3 tabs on 11/09/11, 2 tabs on 11/10/11, 1 tab on 11/11/11 then stop      CONTINUE taking these medications         atenolol 100 MG tablet   Commonly known as: TENORMIN      diltiazem 180 MG 24 hr capsule   Commonly known as: TIAZAC      furosemide 80 MG tablet   Commonly known as: LASIX      levothyroxine 200 MCG tablet   Commonly known as: SYNTHROID, LEVOTHROID      STOP taking these medications         enalapril 20 MG tablet   Commonly known as: VASOTEC          Where to get your medications    These are the prescriptions that you need to pick up.   You may get these medications from any pharmacy.         predniSONE 20 MG tablet              Disposition: Home with Father.  No further home health needs identified at this time.    Discharged Condition: Ramsay Bognar Bomberger has met maximum benefit of inpatient care and is medically stable and cleared for discharge.  Patient is pending follow up as above.      Time spent on disposition:  Greater than 35 minutes.   Signed: Canary Brim, NP-C [facility] Pgr: [phone]  Cyril Mourning MD

## 2012-11-07 NOTE — Progress Notes (Signed)
Patient discharged to home with father.  Discharge teaching completed with father including follow up care, and medications.  Father verbalizes understanding with no further questions.  Vital signs stable, no complaints of pain.  Discharged per wheelchair with father.

## 2013-03-28 ENCOUNTER — Telehealth: Payer: Self-pay | Admitting: Internal Medicine

## 2013-03-28 NOTE — Telephone Encounter (Signed)
Patient scheduled to see Dr. Leone Payor

## 2013-04-02 ENCOUNTER — Encounter (HOSPITAL_BASED_OUTPATIENT_CLINIC_OR_DEPARTMENT_OTHER): Payer: Medicare Other | Attending: Plastic Surgery

## 2013-04-02 DIAGNOSIS — Z9089 Acquired absence of other organs: Secondary | ICD-10-CM | POA: Insufficient documentation

## 2013-04-02 DIAGNOSIS — E039 Hypothyroidism, unspecified: Secondary | ICD-10-CM | POA: Insufficient documentation

## 2013-04-02 DIAGNOSIS — I872 Venous insufficiency (chronic) (peripheral): Secondary | ICD-10-CM | POA: Insufficient documentation

## 2013-04-02 DIAGNOSIS — I890 Lymphedema, not elsewhere classified: Secondary | ICD-10-CM | POA: Insufficient documentation

## 2013-04-02 DIAGNOSIS — I10 Essential (primary) hypertension: Secondary | ICD-10-CM | POA: Insufficient documentation

## 2013-04-02 DIAGNOSIS — L97809 Non-pressure chronic ulcer of other part of unspecified lower leg with unspecified severity: Secondary | ICD-10-CM | POA: Insufficient documentation

## 2013-04-02 DIAGNOSIS — I739 Peripheral vascular disease, unspecified: Secondary | ICD-10-CM | POA: Insufficient documentation

## 2013-04-02 DIAGNOSIS — Z79899 Other long term (current) drug therapy: Secondary | ICD-10-CM | POA: Insufficient documentation

## 2013-04-02 DIAGNOSIS — I059 Rheumatic mitral valve disease, unspecified: Secondary | ICD-10-CM | POA: Insufficient documentation

## 2013-04-02 DIAGNOSIS — E669 Obesity, unspecified: Secondary | ICD-10-CM | POA: Insufficient documentation

## 2013-04-03 NOTE — Progress Notes (Signed)
[facility]  NAME:  Eduardo Herman, Eduardo Herman NO.:  [id number]  MEDICAL RECORD NO.:  [id number]      DATE OF BIRTH:  01/07/1951  PHYSICIAN:  Wayland Denis, DO       VISIT DATE:  04/02/2013                                  OFFICE VISIT   The patient is here for evaluation of bilateral lower extremity lymphedema.  He has a small area of skin breakdown on both legs for an area on the right lateral proximal area, right lateral distal, and then left lateral.  On the right lateral distal area, it is 0.9 x 1.5 x 0.1, proximal area is 1.0 x 0.8 x 0.1, and in  the left leg 2.9 x 3.5 x 0.1. He has not been doing much for treatment at home.  PAST MEDICAL HISTORY:  Peripheral arterial disease, obesity, mitral regurgitation, hypertension, history of cellulitis in the right leg, and hypothyroidism.  He has had a cholecystectomy and a urology surgery by Dr. Annabell Howells.  No known drug allergies.  MEDICATIONS:  Atenolol, Lasix, diltiazem, K-Dur, aspirin, and Synthroid.  REVIEW OF SYSTEMS:  Otherwise negative.  SOCIAL HISTORY:  Lives at home with his dad.  He has a history of noncompliance.  PHYSICAL EXAMINATION:  GENERAL:  He is alert and cooperative.  His dad is with him. HEENT:  His extraocular muscles are intact.  He is overweight. ABDOMEN:  Large, unable to palpate any organs, but he is not nontender. CHEST:  His breathing is unlabored HEART:  His heart rate is regular. 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  He did break down as mentioned.  We will encourage multivitamin, vitamin C, zinc, elevation wraps.  He states that he does have the home compression pumps, but he has not been using them, we are encouraging him to do that.  We will go with Unna boots for present time, and see him back in followup in a week.     Wayland Denis, DO     CS/MEDQ  D:  04/02/2013  T:  04/03/2013  Job:   [id number]

## 2013-04-23 NOTE — Progress Notes (Signed)
[facility]  NAME:  Eduardo Herman, Eduardo Herman NO.:  [id number]  MEDICAL RECORD NO.:  [id number]      DATE OF BIRTH:  [birth year]  PHYSICIAN:  Wayland Denis, DO            VISIT DATE:                                  OFFICE VISIT   HISTORY OF PRESENT ILLNESS:  The patient is a 62 year old male, who is here with his dad for followup on his bilateral lower extremity, chronic venous insufficiency, lymphedema, ulcers.  He has been using Radio broadcast assistant. He has got a little bit of worsening area on the right lateral leg.  It is unclear whether or not he has really been keeping his legs up.  It does not appear to be the case.  REVIEW OF SYSTEMS:  Otherwise negative.  No change in his medications.  PHYSICAL EXAMINATION:  On exam, he is alert and oriented.  He is cooperative.  His breathing is unlabored and his heart rate is regular. His abdomen is large, but nontender.  The wounds are slightly worse on the right.  The left leg looks a little bit better.  We will continue with the Unna boot, silver alginate on the right and see him back in followup.  He really needs elevation, multivitamin, and pumps at home.     Wayland Denis, DO     CS/MEDQ  D:  04/23/2013  T:  04/23/2013  Job:  [id number]

## 2013-04-24 ENCOUNTER — Ambulatory Visit: Payer: Medicare Other | Admitting: Internal Medicine

## 2013-05-10 ENCOUNTER — Encounter (HOSPITAL_BASED_OUTPATIENT_CLINIC_OR_DEPARTMENT_OTHER): Payer: Medicare Other | Attending: Plastic Surgery

## 2013-05-10 DIAGNOSIS — L97809 Non-pressure chronic ulcer of other part of unspecified lower leg with unspecified severity: Secondary | ICD-10-CM | POA: Insufficient documentation

## 2013-05-10 DIAGNOSIS — I872 Venous insufficiency (chronic) (peripheral): Secondary | ICD-10-CM | POA: Insufficient documentation

## 2013-05-10 DIAGNOSIS — I890 Lymphedema, not elsewhere classified: Secondary | ICD-10-CM | POA: Insufficient documentation

## 2013-06-06 ENCOUNTER — Other Ambulatory Visit (HOSPITAL_COMMUNITY): Payer: Self-pay | Admitting: Internal Medicine

## 2013-06-06 ENCOUNTER — Ambulatory Visit (HOSPITAL_COMMUNITY)
Admission: RE | Admit: 2013-06-06 | Discharge: 2013-06-06 | Disposition: A | Discharge status: Home / Self Care | Payer: Medicare Other | Source: Ambulatory Visit | Attending: Internal Medicine | Admitting: Internal Medicine

## 2013-06-06 DIAGNOSIS — R05 Cough: Secondary | ICD-10-CM

## 2013-06-06 DIAGNOSIS — J9819 Other pulmonary collapse: Secondary | ICD-10-CM | POA: Insufficient documentation

## 2013-06-06 DIAGNOSIS — I517 Cardiomegaly: Secondary | ICD-10-CM | POA: Insufficient documentation

## 2013-06-06 DIAGNOSIS — R059 Cough, unspecified: Secondary | ICD-10-CM | POA: Insufficient documentation

## 2013-06-25 ENCOUNTER — Encounter (HOSPITAL_BASED_OUTPATIENT_CLINIC_OR_DEPARTMENT_OTHER): Payer: Medicare Other | Attending: Plastic Surgery

## 2013-06-25 DIAGNOSIS — I890 Lymphedema, not elsewhere classified: Secondary | ICD-10-CM | POA: Insufficient documentation

## 2013-06-25 NOTE — Progress Notes (Signed)
[facility]  NAME:  Eduardo Herman, Eduardo Herman NO.:  [id number]  MEDICAL RECORD NO.:  [id number]      DATE OF BIRTH:  1951-04-25  PHYSICIAN:  Wayland Denis, DO       VISIT DATE:  06/25/2013                                  OFFICE VISIT   HISTORY OF PRESENT ILLNESS:  The patient is a 62 year old male, who is here for followup on his bilateral lower extremity lymphedema.  He has not been compliant with his care and elevation of his legs.  He has some mental disabilities that make it extremely difficult for his family to help him stay on track with his treatment plan.  PHYSICAL EXAMINATION:  GENERAL:  He is alert and otherwise cooperative during the exam. HEENT:  Pupils are equal.  Extraocular muscles are intact.  He does not have any cervical lymphadenopathy. ABDOMEN:  He is obese, but no abdominal tenderness. EXTREMITIES:  His lower extremities are extremely swollen and edematous. He has some superficial breakdown of the skin likely related to the swelling and slight erythema.  RECOMMENDATIONS:  Recommending Unna boot and hopefully working towards compression stockings to decrease the swelling as well as trying to express the importance of elevation during the day.     Wayland Denis, DO     CS/MEDQ  D:  06/25/2013  T:  06/25/2013  Job:  [id number]

## 2013-07-09 ENCOUNTER — Encounter (HOSPITAL_BASED_OUTPATIENT_CLINIC_OR_DEPARTMENT_OTHER): Payer: Medicare Other | Attending: Plastic Surgery

## 2013-07-09 DIAGNOSIS — I739 Peripheral vascular disease, unspecified: Secondary | ICD-10-CM | POA: Insufficient documentation

## 2013-07-09 DIAGNOSIS — I890 Lymphedema, not elsewhere classified: Secondary | ICD-10-CM | POA: Insufficient documentation

## 2013-07-09 DIAGNOSIS — L97809 Non-pressure chronic ulcer of other part of unspecified lower leg with unspecified severity: Secondary | ICD-10-CM | POA: Insufficient documentation

## 2013-07-16 NOTE — Progress Notes (Signed)
[facility]  NAME:  Eduardo Herman, CROSS NO.:  MEDICAL RECORD NO.:  [id number]      DATE OF BIRTH:  1951-09-10  PHYSICIAN:  Wayland Denis, DO       VISIT DATE:  07/16/2013                                  OFFICE VISIT   The patient is a 62 year old male, who is here for evaluation and followup on his bilateral lower extremity peripheral vascular disease, lymphedema, and ulcers.  He is doing extremely well.  The redness has all resolved.  The areas are completely dry.  He looks better today than he has ever looked.  His dad states that he has been elevating and it has made extreme different.  There has been no change in his medications or social history.  On exam, he is alert, oriented, cooperative, not in any acute distress. His pupils are equal.  His extraocular muscles are intact.  He does not have cervical lymphadenopathy.  His abdomen is large, but soft and nontender.  The wounds are described above.  He is to wear his Juxta hose for compression on a regular basis, to keep his legs elevated when he is at home, and continue with a high-protein diet.  Follow up as needed.     Wayland Denis, DO     CS/MEDQ  D:  07/16/2013  T:  07/16/2013  Job:  [id number]

## 2013-09-14 ENCOUNTER — Encounter: Payer: Medicare Other | Admitting: *Deleted

## 2013-09-14 NOTE — Progress Notes (Signed)
This encounter was created in error - please disregard. Error   

## 2013-09-25 ENCOUNTER — Ambulatory Visit: Payer: Self-pay | Admitting: Internal Medicine

## 2013-11-28 ENCOUNTER — Encounter: Payer: Self-pay | Admitting: Physician Assistant

## 2013-11-28 ENCOUNTER — Ambulatory Visit (INDEPENDENT_AMBULATORY_CARE_PROVIDER_SITE_OTHER): Payer: Medicare Other | Admitting: Physician Assistant

## 2013-11-28 VITALS — BP 138/90 | HR 64 | Temp 97.9°F | Resp 16 | Ht 69.5 in | Wt 314.0 lb

## 2013-11-28 DIAGNOSIS — R0600 Dyspnea, unspecified: Secondary | ICD-10-CM

## 2013-11-28 DIAGNOSIS — I10 Essential (primary) hypertension: Secondary | ICD-10-CM

## 2013-11-28 DIAGNOSIS — E785 Hyperlipidemia, unspecified: Secondary | ICD-10-CM | POA: Insufficient documentation

## 2013-11-28 DIAGNOSIS — R7309 Other abnormal glucose: Secondary | ICD-10-CM

## 2013-11-28 DIAGNOSIS — E782 Mixed hyperlipidemia: Secondary | ICD-10-CM

## 2013-11-28 DIAGNOSIS — J209 Acute bronchitis, unspecified: Secondary | ICD-10-CM

## 2013-11-28 DIAGNOSIS — R7303 Prediabetes: Secondary | ICD-10-CM

## 2013-11-28 DIAGNOSIS — Z79899 Other long term (current) drug therapy: Secondary | ICD-10-CM

## 2013-11-28 LAB — CBC WITH DIFFERENTIAL/PLATELET
BASOS ABS: 0 10*3/uL (ref 0.0–0.1)
BASOS PCT: 0 % (ref 0–1)
EOS ABS: 0.4 10*3/uL (ref 0.0–0.7)
EOS PCT: 4 % (ref 0–5)
HEMATOCRIT: 46.1 % (ref 39.0–52.0)
HEMOGLOBIN: 15.3 g/dL (ref 13.0–17.0)
Lymphocytes Relative: 28 % (ref 12–46)
Lymphs Abs: 2.6 10*3/uL (ref 0.7–4.0)
MCH: 28 pg (ref 26.0–34.0)
MCHC: 33.2 g/dL (ref 30.0–36.0)
MCV: 84.4 fL (ref 78.0–100.0)
MONO ABS: 0.7 10*3/uL (ref 0.1–1.0)
MONOS PCT: 7 % (ref 3–12)
Neutro Abs: 5.5 10*3/uL (ref 1.7–7.7)
Neutrophils Relative %: 61 % (ref 43–77)
Platelets: 246 10*3/uL (ref 150–400)
RBC: 5.46 MIL/uL (ref 4.22–5.81)
RDW: 15.1 % (ref 11.5–15.5)
WBC: 9.1 10*3/uL (ref 4.0–10.5)

## 2013-11-28 LAB — HEMOGLOBIN A1C
Hgb A1c MFr Bld: 6 % — ABNORMAL HIGH (ref <5.7)
MEAN PLASMA GLUCOSE: 126 mg/dL — AB (ref <117)

## 2013-11-28 MED ORDER — PREDNISONE 20 MG PO TABS: ORAL_TABLET | ORAL | Status: DC

## 2013-11-28 MED ORDER — LEVOFLOXACIN 500 MG PO TABS: 500.0000 mg | ORAL_TABLET | Freq: Every day | ORAL | Status: DC

## 2013-11-28 MED ORDER — PROMETHAZINE-CODEINE 6.25-10 MG/5ML PO SYRP: 5.0000 mL | ORAL_SOLUTION | Freq: Four times a day (QID) | ORAL | Status: DC | PRN

## 2013-11-28 NOTE — Progress Notes (Deleted)
HPI Patient presents for 3 month follow up with hypertension, hyperlipidemia, prediabetes and vitamin D. Patient's blood pressure has been controlled at home, today their BP is BP: 138/90 mmHg  Patient denies chest pain, shortness of breath, dizziness.  Patient's cholesterol is diet controlled. The patient has been working on diet and exercise for prediabetes, and denies changes in vision, polys, and paresthesias. Patient is on Vitamin D supplement.   Cough This is a new problem. The current episode started in the past 7 days. The problem has been gradually worsening. The problem occurs constantly. The cough is productive of purulent sputum. Associated symptoms include headaches, myalgias, nasal congestion, postnasal drip, rhinorrhea and wheezing. Pertinent negatives include no chest pain, chills, ear congestion, ear pain, fever, heartburn, hemoptysis, rash, sore throat, shortness of breath, sweats or weight loss. He has tried nothing for the symptoms. The treatment provided no relief. His past medical history is significant for COPD.   Current Medications:  Current Outpatient Prescriptions on File Prior to Visit  Medication Sig Dispense Refill  . atenolol (TENORMIN) 100 MG tablet Take 100 mg by mouth daily.      Marland Kitchen diltiazem (TIAZAC) 180 MG 24 hr capsule Take 360 mg by mouth daily.      . furosemide (LASIX) 80 MG tablet Take 80 mg by mouth 2 (two) times daily.      Marland Kitchen levothyroxine (SYNTHROID, LEVOTHROID) 200 MCG tablet Take 200 mcg by mouth daily.       No current facility-administered medications on file prior to visit.   Medical History:  Past Medical History  Diagnosis Date  . Down's syndrome   . Hypertension   . Hypothyroidism   . Mental disorder   . Shortness of breath   . Peripheral vascular disease   . Angioedema of lips     likely secondary to ACE inhibitor.    Allergies:  Allergies  Allergen Reactions  . Lisinopril Swelling    Angioedema of the tongue     ROS Constitutional: Denies fever, chills, headaches, insomnia, fatigue, night sweats Eyes: Denies redness, blurred vision, diplopia, discharge, itchy, watery eyes.  ENT: Denies congestion, post nasal drip, sore throat, earache, dental pain, Tinnitus, Vertigo, Sinus pain, snoring.  Cardio: + edema Denies chest pain, palpitations, irregular heartbeat, dyspnea, diaphoresis, orthopnea, PND, claudication Respiratory: + cough denies shortness of breath, wheezing.  Gastrointestinal: Denies dysphagia, heartburn, AB pain/ cramps, N/V, diarrhea, constipation, hematemesis, melena, hematochezia,  hemorrhoids Genitourinary: Denies dysuria, frequency, urgency, nocturia, hesitancy, discharge, hematuria, flank pain Musculoskeletal: Denies myalgia, stiffness, pain, swelling and strain/sprain. Skin: Denies pruritis, rash, changing in skin lesion Neuro: Denies Weakness, tremor, incoordination, spasms, pain Psychiatric: Denies confusion, memory loss, sensory loss Endocrine: Denies change in weight, skin, hair change, nocturia Diabetic Polys, Denies visual blurring, hyper /hypo glycemic episodes, and paresthesia, Heme/Lymph: Denies Excessive bleeding, bruising, enlarged lymph nodes  Family history- Review and unchanged Social history- Review and unchanged Physical Exam: Filed Vitals:   11/28/13 1454  BP: 138/90  Pulse: 64  Temp: 97.9 F (36.6 C)  Resp: 16   Filed Weights   11/28/13 1454  Weight: 314 lb (142.429 kg)   General Appearance: Well nourished Eyes: PERRLA, EOMs, conjunctiva no swelling or erythema Sinuses: No Frontal/maxillary tenderness ENT/Mouth: Ext aud canals clear, TMs without erythema, bulging. No erythema, swelling, or exudate on post pharynx.  Tonsils not swollen or erythematous. Hearing normal.  Neck: Supple, thyroid normal.  Respiratory: Respiratory effort normal, BS equal bilaterally with bilateral lower lobes wheezing/rales  Cardio: RRR with  no MRGs. Brisk peripheral pulses 1-2  + edema.  Abdomen: Distended, obese + BS.  Non tender, no guarding, rebound, hernias, masses. Lymphatics: Non tender without lymphadenopathy.  Musculoskeletal: Full ROM, 5/5 strength, normal gait.  Skin: Warm, dry without rashes, lesions, ecchymosis.  Neuro: Cranial nerves intact.  Psych: Awake and oriented X 2, patient's dad is present to help with ROS/PE.   Assessment and Plan:  Hypertension: Continue medication, monitor blood pressure at home.  Continue DASH diet. Cholesterol: Continue diet and exercise. Check cholesterol.  Pre-diabetes-Continue diet and exercise. Check A1C Vitamin D Def- check level and continue medications.  Acute bronchitis likely- with edema will get BNP rule out fluid overload- Plan: levofloxacin (LEVAQUIN) 500 MG tablet, predniSONE (DELTASONE) 20 MG tablet, promethazine-codeine (PHENERGAN WITH CODEINE) 6.25-10 MG/5ML syrup,  Continue diet and meds as discussed. Further disposition pending results of labs.  Vicie Mutters 5:33 PM     Subjective:    Patient ID: Eduardo Herman, male    DOB: [birth year], 63 y.o.   MRN: [id number]  Cough This is a new problem. The current episode started in the past 7 days. The problem has been gradually worsening. The problem occurs constantly. The cough is productive of purulent sputum. Associated symptoms include headaches, myalgias, nasal congestion, postnasal drip, rhinorrhea and wheezing. Pertinent negatives include no chest pain, chills, ear congestion, ear pain, fever, heartburn, hemoptysis, rash, sore throat, shortness of breath, sweats or weight loss. He has tried nothing for the symptoms. The treatment provided no relief. His past medical history is significant for COPD.    Review of Systems  Unable to perform ROS: Psychiatric disorder  Constitutional: Negative.  Negative for fever, chills and weight loss.  HENT: Positive for postnasal drip and rhinorrhea. Negative for ear pain and sore throat.   Respiratory: Positive  for cough and wheezing. Negative for hemoptysis and shortness of breath.   Cardiovascular: Negative for chest pain.  Gastrointestinal: Negative.  Negative for heartburn.  Genitourinary: Negative.   Musculoskeletal: Positive for myalgias.  Skin: Negative for rash.  Neurological: Positive for headaches.       Objective:   Physical Exam  Constitutional: He is oriented to person, place, and time. He appears well-developed and well-nourished.  HENT:  Head: Normocephalic and atraumatic.  Right Ear: External ear normal.  Left Ear: External ear normal.  Nose: Nose normal.  Mouth/Throat: Oropharynx is clear and moist.  Eyes: Conjunctivae are normal. Pupils are equal, round, and reactive to light.  Neck: Normal range of motion. Neck supple.  Cardiovascular: Normal rate, regular rhythm and normal heart sounds.   No murmur heard. Pulmonary/Chest: Effort normal. No respiratory distress. He has wheezes. He has no rales. He exhibits no tenderness.  Abdominal: Soft. Bowel sounds are normal.  Lymphadenopathy:    He has no cervical adenopathy.  Neurological: He is alert and oriented to person, place, and time.  Skin: Skin is warm and dry.      Assessment & Plan:

## 2013-11-28 NOTE — Progress Notes (Signed)
HPI Patient presents for 3 month follow up with hypertension, hyperlipidemia, prediabetes and vitamin D. Patient's blood pressure has been controlled at home, today their BP is BP: 138/90 mmHg  Patient denies chest pain, shortness of breath, dizziness.  Patient's cholesterol is diet controlled. The patient has been working on diet and exercise for prediabetes, and denies changes in vision, polys, and paresthesias. Patient is on Vitamin D supplement.   Cough This is a new problem. The current episode started in the past 7 days. The problem has been gradually worsening. The problem occurs constantly. The cough is productive of purulent sputum. Associated symptoms include headaches, myalgias, nasal congestion, postnasal drip, rhinorrhea and wheezing. Pertinent negatives include no chest pain, chills, ear congestion, ear pain, fever, heartburn, hemoptysis, rash, sore throat, shortness of breath, sweats or weight loss. He has tried nothing for the symptoms. The treatment provided no relief. His past medical history is significant for COPD.   Current Medications:  Current Outpatient Prescriptions on File Prior to Visit  Medication Sig Dispense Refill  . atenolol (TENORMIN) 100 MG tablet Take 100 mg by mouth daily.      Marland Kitchen diltiazem (TIAZAC) 180 MG 24 hr capsule Take 360 mg by mouth daily.      . furosemide (LASIX) 80 MG tablet Take 80 mg by mouth 2 (two) times daily.      Marland Kitchen levothyroxine (SYNTHROID, LEVOTHROID) 200 MCG tablet Take 200 mcg by mouth daily.       No current facility-administered medications on file prior to visit.   Medical History:  Past Medical History  Diagnosis Date  . Down's syndrome   . Hypertension   . Hypothyroidism   . Mental disorder   . Shortness of breath   . Peripheral vascular disease   . Angioedema of lips     likely secondary to ACE inhibitor.    Allergies:  Allergies  Allergen Reactions  . Lisinopril Swelling    Angioedema of the tongue     ROS Constitutional: Denies fever, chills, headaches, insomnia, fatigue, night sweats Eyes: Denies redness, blurred vision, diplopia, discharge, itchy, watery eyes.  ENT: Denies congestion, post nasal drip, sore throat, earache, dental pain, Tinnitus, Vertigo, Sinus pain, snoring.  Cardio: + edema Denies chest pain, palpitations, irregular heartbeat, dyspnea, diaphoresis, orthopnea, PND, claudication Respiratory: + cough denies shortness of breath, wheezing.  Gastrointestinal: Denies dysphagia, heartburn, AB pain/ cramps, N/V, diarrhea, constipation, hematemesis, melena, hematochezia,  hemorrhoids Genitourinary: Denies dysuria, frequency, urgency, nocturia, hesitancy, discharge, hematuria, flank pain Musculoskeletal: Denies myalgia, stiffness, pain, swelling and strain/sprain. Skin: Denies pruritis, rash, changing in skin lesion Neuro: Denies Weakness, tremor, incoordination, spasms, pain Psychiatric: Denies confusion, memory loss, sensory loss Endocrine: Denies change in weight, skin, hair change, nocturia Diabetic Polys, Denies visual blurring, hyper /hypo glycemic episodes, and paresthesia, Heme/Lymph: Denies Excessive bleeding, bruising, enlarged lymph nodes  Family history- Review and unchanged Social history- Review and unchanged Physical Exam: Filed Vitals:   11/28/13 1454  BP: 138/90  Pulse: 64  Temp: 97.9 F (36.6 C)  Resp: 16   Filed Weights   11/28/13 1454  Weight: 314 lb (142.429 kg)   General Appearance: Well nourished Eyes: PERRLA, EOMs, conjunctiva no swelling or erythema Sinuses: No Frontal/maxillary tenderness ENT/Mouth: Ext aud canals clear, TMs without erythema, bulging. No erythema, swelling, or exudate on post pharynx.  Tonsils not swollen or erythematous. Hearing normal.  Neck: Supple, thyroid normal.  Respiratory: Respiratory effort normal, BS equal bilaterally with bilateral lower lobes wheezing/rales  Cardio: RRR with  no MRGs. Brisk peripheral pulses 1-2  + edema.  Abdomen: Distended, obese + BS.  Non tender, no guarding, rebound, hernias, masses. Lymphatics: Non tender without lymphadenopathy.  Musculoskeletal: Full ROM, 5/5 strength, normal gait.  Skin: Warm, dry without rashes, lesions, ecchymosis.  Neuro: Cranial nerves intact.  Psych: Awake and oriented X 2, patient's dad is present to help with ROS/PE.   Assessment and Plan:  Hypertension: Continue medication, monitor blood pressure at home.  Continue DASH diet. Cholesterol: Continue diet and exercise. Check cholesterol.  Pre-diabetes-Continue diet and exercise. Check A1C Vitamin D Def- check level and continue medications.  Acute bronchitis likely- with edema will get BNP rule out fluid overload- Plan: levofloxacin (LEVAQUIN) 500 MG tablet, predniSONE (DELTASONE) 20 MG tablet, promethazine-codeine (PHENERGAN WITH CODEINE) 6.25-10 MG/5ML syrup,  Continue diet and meds as discussed. Further disposition pending results of labs.  Vicie Mutters 5:33 PM

## 2013-11-29 LAB — BASIC METABOLIC PANEL WITH GFR
BUN: 15 mg/dL (ref 6–23)
CO2: 28 mEq/L (ref 19–32)
CREATININE: 0.99 mg/dL (ref 0.50–1.35)
Calcium: 8.5 mg/dL (ref 8.4–10.5)
Chloride: 106 mEq/L (ref 96–112)
GFR, Est Non African American: 80 mL/min
GLUCOSE: 99 mg/dL (ref 70–99)
POTASSIUM: 3.9 meq/L (ref 3.5–5.3)
Sodium: 143 mEq/L (ref 135–145)

## 2013-11-29 LAB — HEPATIC FUNCTION PANEL
ALBUMIN: 3.6 g/dL (ref 3.5–5.2)
ALT: 19 U/L (ref 0–53)
AST: 22 U/L (ref 0–37)
Alkaline Phosphatase: 84 U/L (ref 39–117)
BILIRUBIN INDIRECT: 0.5 mg/dL (ref 0.2–1.2)
Bilirubin, Direct: 0.1 mg/dL (ref 0.0–0.3)
TOTAL PROTEIN: 7.1 g/dL (ref 6.0–8.3)
Total Bilirubin: 0.6 mg/dL (ref 0.2–1.2)

## 2013-11-29 LAB — BRAIN NATRIURETIC PEPTIDE: BRAIN NATRIURETIC PEPTIDE: 65.8 pg/mL (ref 0.0–100.0)

## 2013-11-29 LAB — LIPID PANEL
Cholesterol: 139 mg/dL (ref 0–200)
HDL: 21 mg/dL — ABNORMAL LOW (ref >39)
LDL CALC: 82 mg/dL (ref 0–99)
TRIGLYCERIDES: 180 mg/dL — AB (ref <150)
Total CHOL/HDL Ratio: 6.6 Ratio
VLDL: 36 mg/dL (ref 0–40)

## 2013-11-29 LAB — MAGNESIUM: MAGNESIUM: 2 mg/dL (ref 1.5–2.5)

## 2013-11-29 LAB — TSH: TSH: 0.814 u[IU]/mL (ref 0.350–4.500)

## 2013-12-11 ENCOUNTER — Other Ambulatory Visit: Payer: Self-pay | Admitting: Physician Assistant

## 2013-12-11 MED ORDER — POTASSIUM CHLORIDE ER 20 MEQ PO TBCR
20.0000 meq | EXTENDED_RELEASE_TABLET | Freq: Three times a day (TID) | ORAL | Status: AC
Start: 2013-12-11 — End: ?

## 2013-12-11 MED ORDER — DILTIAZEM HCL ER BEADS 180 MG PO CP24: 360.0000 mg | ORAL_CAPSULE | Freq: Every day | ORAL | Status: DC

## 2014-04-03 ENCOUNTER — Encounter: Payer: Self-pay | Admitting: Internal Medicine

## 2014-05-28 ENCOUNTER — Encounter (HOSPITAL_BASED_OUTPATIENT_CLINIC_OR_DEPARTMENT_OTHER): Payer: Medicare Other | Attending: General Surgery

## 2014-05-28 DIAGNOSIS — L97909 Non-pressure chronic ulcer of unspecified part of unspecified lower leg with unspecified severity: Secondary | ICD-10-CM | POA: Diagnosis not present

## 2014-05-28 DIAGNOSIS — I872 Venous insufficiency (chronic) (peripheral): Secondary | ICD-10-CM | POA: Diagnosis not present

## 2014-05-28 DIAGNOSIS — L97809 Non-pressure chronic ulcer of other part of unspecified lower leg with unspecified severity: Secondary | ICD-10-CM | POA: Insufficient documentation

## 2014-05-28 DIAGNOSIS — I87339 Chronic venous hypertension (idiopathic) with ulcer and inflammation of unspecified lower extremity: Secondary | ICD-10-CM | POA: Diagnosis present

## 2014-05-29 NOTE — H&P (Signed)
NAME:  Eduardo Herman, Eduardo Herman NO.:  [id number]  MEDICAL RECORD NO.:  [id number]  LOCATION:  FOOT                         FACILITY:  [facility]  PHYSICIAN:  Elesa Hacker, M.D.        DATE OF BIRTH:  03/20/1951  DATE OF ADMISSION:  05/28/2014 DATE OF DISCHARGE:                             HISTORY & PHYSICAL   CHIEF COMPLAINT:  Wounds both legs.  HISTORY OF PRESENT ILLNESS:  This patient is mentally challenged and historian is his father.  He has a history of previous venous ulcers and started draining several weeks ago.  There has been no complaints of pain.  No fevers, chills, or sweating spells.  PAST MEDICAL HISTORY:  Significant for hypertension, hypothyroidism, obesity, peripheral vascular disease, and mental retardation.  PAST SURGICAL HISTORY:  Cholecystectomy, neurological surgery.  SOCIAL HISTORY:  Cigarettes a few per day.  Alcohol none.  MEDICATIONS:  Atenolol, diltiazem, Lasix, Synthroid, K-Dur.  ALLERGY:  LISINOPRIL.  REVIEW OF SYSTEMS:  As above.  PHYSICAL EXAMINATION:  VITAL SIGNS:  Temperature 97.9, pulse 60, respirations 19, blood pressure 160/77. GENERAL APPEARANCE:  Well developed, not well nourished. CHEST:  Clear. HEART:  Regular rhythm. EXTREMITIES:  Revealed good peripheral pulses.  There are multiple wounds in both legs.  On the left leg, the wounds total approximately 20 x 20 cm.  The right leg approximately 14 x 10 cm.  The skin is rough, and there are areas of hypergranulation tissue.  IMPRESSION:  Chronic venous hypertension with ulcer and inflammation, possibly some lymphedema.  PLAN OF TREATMENT:  Silver alginate, Unna boots, and elevation.  We will see him in 7 days.     Elesa Hacker, M.D.     RA/MEDQ  D:  05/28/2014  T:  05/29/2014  Job:  [id number]

## 2014-06-04 ENCOUNTER — Encounter (HOSPITAL_BASED_OUTPATIENT_CLINIC_OR_DEPARTMENT_OTHER): Payer: Medicare Other | Attending: General Surgery

## 2014-06-04 DIAGNOSIS — L97809 Non-pressure chronic ulcer of other part of unspecified lower leg with unspecified severity: Secondary | ICD-10-CM | POA: Diagnosis not present

## 2014-06-04 DIAGNOSIS — L97909 Non-pressure chronic ulcer of unspecified part of unspecified lower leg with unspecified severity: Secondary | ICD-10-CM | POA: Diagnosis not present

## 2014-06-04 DIAGNOSIS — I87339 Chronic venous hypertension (idiopathic) with ulcer and inflammation of unspecified lower extremity: Secondary | ICD-10-CM | POA: Insufficient documentation

## 2014-06-11 DIAGNOSIS — I87339 Chronic venous hypertension (idiopathic) with ulcer and inflammation of unspecified lower extremity: Secondary | ICD-10-CM | POA: Diagnosis not present

## 2014-06-11 DIAGNOSIS — L97809 Non-pressure chronic ulcer of other part of unspecified lower leg with unspecified severity: Secondary | ICD-10-CM | POA: Diagnosis not present

## 2014-06-18 DIAGNOSIS — L97809 Non-pressure chronic ulcer of other part of unspecified lower leg with unspecified severity: Secondary | ICD-10-CM | POA: Diagnosis not present

## 2014-06-18 DIAGNOSIS — I87339 Chronic venous hypertension (idiopathic) with ulcer and inflammation of unspecified lower extremity: Secondary | ICD-10-CM | POA: Diagnosis not present

## 2014-06-18 DIAGNOSIS — L97909 Non-pressure chronic ulcer of unspecified part of unspecified lower leg with unspecified severity: Secondary | ICD-10-CM | POA: Diagnosis not present

## 2014-06-25 DIAGNOSIS — I87339 Chronic venous hypertension (idiopathic) with ulcer and inflammation of unspecified lower extremity: Secondary | ICD-10-CM | POA: Diagnosis not present

## 2014-06-25 DIAGNOSIS — L97909 Non-pressure chronic ulcer of unspecified part of unspecified lower leg with unspecified severity: Secondary | ICD-10-CM | POA: Diagnosis not present

## 2014-06-25 DIAGNOSIS — L97809 Non-pressure chronic ulcer of other part of unspecified lower leg with unspecified severity: Secondary | ICD-10-CM | POA: Diagnosis not present

## 2014-07-02 ENCOUNTER — Encounter (HOSPITAL_BASED_OUTPATIENT_CLINIC_OR_DEPARTMENT_OTHER): Payer: Medicare Other | Attending: General Surgery

## 2014-07-02 DIAGNOSIS — I87339 Chronic venous hypertension (idiopathic) with ulcer and inflammation of unspecified lower extremity: Secondary | ICD-10-CM | POA: Insufficient documentation

## 2014-07-02 DIAGNOSIS — L97909 Non-pressure chronic ulcer of unspecified part of unspecified lower leg with unspecified severity: Secondary | ICD-10-CM | POA: Diagnosis not present

## 2014-07-09 DIAGNOSIS — L97909 Non-pressure chronic ulcer of unspecified part of unspecified lower leg with unspecified severity: Secondary | ICD-10-CM | POA: Diagnosis not present

## 2014-07-09 DIAGNOSIS — I87339 Chronic venous hypertension (idiopathic) with ulcer and inflammation of unspecified lower extremity: Secondary | ICD-10-CM | POA: Diagnosis not present

## 2014-07-16 DIAGNOSIS — L97909 Non-pressure chronic ulcer of unspecified part of unspecified lower leg with unspecified severity: Secondary | ICD-10-CM | POA: Diagnosis not present

## 2014-07-16 DIAGNOSIS — I87339 Chronic venous hypertension (idiopathic) with ulcer and inflammation of unspecified lower extremity: Secondary | ICD-10-CM | POA: Diagnosis not present

## 2014-07-23 DIAGNOSIS — L97909 Non-pressure chronic ulcer of unspecified part of unspecified lower leg with unspecified severity: Secondary | ICD-10-CM | POA: Diagnosis not present

## 2014-07-23 DIAGNOSIS — I87339 Chronic venous hypertension (idiopathic) with ulcer and inflammation of unspecified lower extremity: Secondary | ICD-10-CM | POA: Diagnosis not present

## 2014-07-30 DIAGNOSIS — L97909 Non-pressure chronic ulcer of unspecified part of unspecified lower leg with unspecified severity: Secondary | ICD-10-CM | POA: Diagnosis not present

## 2014-07-30 DIAGNOSIS — I87339 Chronic venous hypertension (idiopathic) with ulcer and inflammation of unspecified lower extremity: Secondary | ICD-10-CM | POA: Diagnosis not present

## 2014-08-06 ENCOUNTER — Encounter (HOSPITAL_BASED_OUTPATIENT_CLINIC_OR_DEPARTMENT_OTHER): Payer: Medicare Other | Attending: General Surgery

## 2014-08-06 DIAGNOSIS — I87321 Chronic venous hypertension (idiopathic) with inflammation of right lower extremity: Secondary | ICD-10-CM | POA: Diagnosis not present

## 2014-08-06 DIAGNOSIS — L97919 Non-pressure chronic ulcer of unspecified part of right lower leg with unspecified severity: Secondary | ICD-10-CM | POA: Diagnosis not present

## 2014-08-06 DIAGNOSIS — L97929 Non-pressure chronic ulcer of unspecified part of left lower leg with unspecified severity: Secondary | ICD-10-CM | POA: Diagnosis not present

## 2014-08-06 DIAGNOSIS — I87323 Chronic venous hypertension (idiopathic) with inflammation of bilateral lower extremity: Secondary | ICD-10-CM | POA: Diagnosis not present

## 2014-08-13 DIAGNOSIS — L97919 Non-pressure chronic ulcer of unspecified part of right lower leg with unspecified severity: Secondary | ICD-10-CM | POA: Diagnosis not present

## 2014-08-13 DIAGNOSIS — I87323 Chronic venous hypertension (idiopathic) with inflammation of bilateral lower extremity: Secondary | ICD-10-CM | POA: Diagnosis not present

## 2014-08-13 DIAGNOSIS — L97929 Non-pressure chronic ulcer of unspecified part of left lower leg with unspecified severity: Secondary | ICD-10-CM | POA: Diagnosis not present

## 2014-08-13 DIAGNOSIS — I87321 Chronic venous hypertension (idiopathic) with inflammation of right lower extremity: Secondary | ICD-10-CM | POA: Diagnosis not present

## 2014-08-20 DIAGNOSIS — I87321 Chronic venous hypertension (idiopathic) with inflammation of right lower extremity: Secondary | ICD-10-CM | POA: Diagnosis not present

## 2014-08-20 DIAGNOSIS — L97919 Non-pressure chronic ulcer of unspecified part of right lower leg with unspecified severity: Secondary | ICD-10-CM | POA: Diagnosis not present

## 2014-08-20 DIAGNOSIS — I87323 Chronic venous hypertension (idiopathic) with inflammation of bilateral lower extremity: Secondary | ICD-10-CM | POA: Diagnosis not present

## 2014-08-20 DIAGNOSIS — L97929 Non-pressure chronic ulcer of unspecified part of left lower leg with unspecified severity: Secondary | ICD-10-CM | POA: Diagnosis not present

## 2015-03-27 NOTE — Progress Notes (Signed)
This encounter was created in error - please disregard.

## 2015-04-09 ENCOUNTER — Encounter (HOSPITAL_BASED_OUTPATIENT_CLINIC_OR_DEPARTMENT_OTHER): Payer: Medicare Other | Attending: Surgery

## 2015-04-09 DIAGNOSIS — L97211 Non-pressure chronic ulcer of right calf limited to breakdown of skin: Secondary | ICD-10-CM | POA: Insufficient documentation

## 2015-04-09 DIAGNOSIS — E039 Hypothyroidism, unspecified: Secondary | ICD-10-CM | POA: Insufficient documentation

## 2015-04-09 DIAGNOSIS — I87331 Chronic venous hypertension (idiopathic) with ulcer and inflammation of right lower extremity: Secondary | ICD-10-CM | POA: Insufficient documentation

## 2015-04-09 DIAGNOSIS — I890 Lymphedema, not elsewhere classified: Secondary | ICD-10-CM | POA: Insufficient documentation

## 2015-04-09 DIAGNOSIS — F79 Unspecified intellectual disabilities: Secondary | ICD-10-CM | POA: Insufficient documentation

## 2015-04-09 DIAGNOSIS — Q909 Down syndrome, unspecified: Secondary | ICD-10-CM | POA: Diagnosis not present

## 2015-04-11 ENCOUNTER — Other Ambulatory Visit: Payer: Self-pay | Admitting: *Deleted

## 2015-04-11 DIAGNOSIS — M7989 Other specified soft tissue disorders: Secondary | ICD-10-CM

## 2015-04-14 ENCOUNTER — Other Ambulatory Visit: Payer: Self-pay | Admitting: Surgery

## 2015-04-14 ENCOUNTER — Ambulatory Visit (HOSPITAL_COMMUNITY)
Admission: RE | Admit: 2015-04-14 | Discharge: 2015-04-14 | Disposition: A | Discharge status: Home / Self Care | Payer: Medicare Other | Source: Ambulatory Visit | Attending: Surgery | Admitting: Surgery

## 2015-04-14 DIAGNOSIS — M7989 Other specified soft tissue disorders: Secondary | ICD-10-CM

## 2015-04-14 DIAGNOSIS — R609 Edema, unspecified: Secondary | ICD-10-CM

## 2015-04-16 DIAGNOSIS — L97211 Non-pressure chronic ulcer of right calf limited to breakdown of skin: Secondary | ICD-10-CM | POA: Diagnosis not present

## 2015-04-23 DIAGNOSIS — L97211 Non-pressure chronic ulcer of right calf limited to breakdown of skin: Secondary | ICD-10-CM | POA: Diagnosis not present

## 2015-04-30 DIAGNOSIS — L97211 Non-pressure chronic ulcer of right calf limited to breakdown of skin: Secondary | ICD-10-CM | POA: Diagnosis not present

## 2015-05-07 ENCOUNTER — Encounter (HOSPITAL_BASED_OUTPATIENT_CLINIC_OR_DEPARTMENT_OTHER): Payer: Medicare Other | Attending: Surgery

## 2015-05-07 DIAGNOSIS — L97212 Non-pressure chronic ulcer of right calf with fat layer exposed: Secondary | ICD-10-CM | POA: Diagnosis not present

## 2015-05-07 DIAGNOSIS — G629 Polyneuropathy, unspecified: Secondary | ICD-10-CM | POA: Diagnosis not present

## 2015-05-07 DIAGNOSIS — I10 Essential (primary) hypertension: Secondary | ICD-10-CM | POA: Insufficient documentation

## 2015-05-07 DIAGNOSIS — I890 Lymphedema, not elsewhere classified: Secondary | ICD-10-CM | POA: Insufficient documentation

## 2015-05-07 DIAGNOSIS — I87331 Chronic venous hypertension (idiopathic) with ulcer and inflammation of right lower extremity: Secondary | ICD-10-CM | POA: Insufficient documentation

## 2015-05-07 DIAGNOSIS — I739 Peripheral vascular disease, unspecified: Secondary | ICD-10-CM | POA: Insufficient documentation

## 2015-05-14 DIAGNOSIS — I87331 Chronic venous hypertension (idiopathic) with ulcer and inflammation of right lower extremity: Secondary | ICD-10-CM | POA: Diagnosis not present

## 2015-05-21 DIAGNOSIS — I87331 Chronic venous hypertension (idiopathic) with ulcer and inflammation of right lower extremity: Secondary | ICD-10-CM | POA: Diagnosis not present

## 2015-05-27 ENCOUNTER — Encounter (HOSPITAL_COMMUNITY): Payer: Self-pay | Admitting: Cardiology

## 2015-05-27 ENCOUNTER — Inpatient Hospital Stay (HOSPITAL_COMMUNITY)
Admission: EM | Admit: 2015-05-27 | Discharge: 2015-06-10 | DRG: 872 | Disposition: A | Discharge status: Home / Self Care | Payer: Medicare Other | Attending: Internal Medicine | Admitting: Internal Medicine

## 2015-05-27 ENCOUNTER — Emergency Department (HOSPITAL_COMMUNITY): Payer: Medicare Other

## 2015-05-27 DIAGNOSIS — E876 Hypokalemia: Secondary | ICD-10-CM | POA: Diagnosis present

## 2015-05-27 DIAGNOSIS — Z888 Allergy status to other drugs, medicaments and biological substances status: Secondary | ICD-10-CM | POA: Diagnosis not present

## 2015-05-27 DIAGNOSIS — I129 Hypertensive chronic kidney disease with stage 1 through stage 4 chronic kidney disease, or unspecified chronic kidney disease: Secondary | ICD-10-CM | POA: Diagnosis present

## 2015-05-27 DIAGNOSIS — R609 Edema, unspecified: Secondary | ICD-10-CM | POA: Diagnosis not present

## 2015-05-27 DIAGNOSIS — N183 Chronic kidney disease, stage 3 unspecified: Secondary | ICD-10-CM | POA: Diagnosis present

## 2015-05-27 DIAGNOSIS — L03115 Cellulitis of right lower limb: Secondary | ICD-10-CM

## 2015-05-27 DIAGNOSIS — N179 Acute kidney failure, unspecified: Secondary | ICD-10-CM | POA: Diagnosis present

## 2015-05-27 DIAGNOSIS — L0390 Cellulitis, unspecified: Secondary | ICD-10-CM

## 2015-05-27 DIAGNOSIS — R600 Localized edema: Secondary | ICD-10-CM

## 2015-05-27 DIAGNOSIS — Z6838 Body mass index (BMI) 38.0-38.9, adult: Secondary | ICD-10-CM | POA: Diagnosis not present

## 2015-05-27 DIAGNOSIS — R531 Weakness: Secondary | ICD-10-CM

## 2015-05-27 DIAGNOSIS — A419 Sepsis, unspecified organism: Secondary | ICD-10-CM | POA: Diagnosis present

## 2015-05-27 DIAGNOSIS — Z79899 Other long term (current) drug therapy: Secondary | ICD-10-CM

## 2015-05-27 DIAGNOSIS — Q909 Down syndrome, unspecified: Secondary | ICD-10-CM | POA: Diagnosis not present

## 2015-05-27 DIAGNOSIS — F17210 Nicotine dependence, cigarettes, uncomplicated: Secondary | ICD-10-CM | POA: Diagnosis present

## 2015-05-27 DIAGNOSIS — F79 Unspecified intellectual disabilities: Secondary | ICD-10-CM | POA: Diagnosis present

## 2015-05-27 DIAGNOSIS — R7309 Other abnormal glucose: Secondary | ICD-10-CM | POA: Diagnosis present

## 2015-05-27 DIAGNOSIS — E039 Hypothyroidism, unspecified: Secondary | ICD-10-CM | POA: Diagnosis present

## 2015-05-27 DIAGNOSIS — Z9049 Acquired absence of other specified parts of digestive tract: Secondary | ICD-10-CM | POA: Diagnosis present

## 2015-05-27 DIAGNOSIS — I878 Other specified disorders of veins: Secondary | ICD-10-CM | POA: Diagnosis present

## 2015-05-27 DIAGNOSIS — I739 Peripheral vascular disease, unspecified: Secondary | ICD-10-CM | POA: Diagnosis present

## 2015-05-27 LAB — COMPREHENSIVE METABOLIC PANEL
ALK PHOS: 96 U/L (ref 38–126)
ALT: 13 U/L — ABNORMAL LOW (ref 17–63)
AST: 16 U/L (ref 15–41)
Albumin: 3.1 g/dL — ABNORMAL LOW (ref 3.5–5.0)
Anion gap: 7 (ref 5–15)
BUN: 13 mg/dL (ref 6–20)
CHLORIDE: 105 mmol/L (ref 101–111)
CO2: 26 mmol/L (ref 22–32)
CREATININE: 1.27 mg/dL — AB (ref 0.61–1.24)
Calcium: 8.5 mg/dL — ABNORMAL LOW (ref 8.9–10.3)
GFR calc non Af Amer: 58 mL/min — ABNORMAL LOW (ref >60)
GLUCOSE: 161 mg/dL — AB (ref 65–99)
Potassium: 3 mmol/L — ABNORMAL LOW (ref 3.5–5.1)
Sodium: 138 mmol/L (ref 135–145)
Total Bilirubin: 1.3 mg/dL — ABNORMAL HIGH (ref 0.3–1.2)
Total Protein: 7.6 g/dL (ref 6.5–8.1)

## 2015-05-27 LAB — CBC WITH DIFFERENTIAL/PLATELET
BASOS ABS: 0 10*3/uL (ref 0.0–0.1)
Basophils Relative: 0 % (ref 0–1)
EOS ABS: 0.1 10*3/uL (ref 0.0–0.7)
Eosinophils Relative: 1 % (ref 0–5)
HEMATOCRIT: 49.6 % (ref 39.0–52.0)
Hemoglobin: 16.5 g/dL (ref 13.0–17.0)
Lymphocytes Relative: 8 % — ABNORMAL LOW (ref 12–46)
Lymphs Abs: 1.4 10*3/uL (ref 0.7–4.0)
MCH: 28.9 pg (ref 26.0–34.0)
MCHC: 33.3 g/dL (ref 30.0–36.0)
MCV: 86.9 fL (ref 78.0–100.0)
Monocytes Absolute: 1.5 10*3/uL — ABNORMAL HIGH (ref 0.1–1.0)
Monocytes Relative: 8 % (ref 3–12)
Neutro Abs: 14.5 10*3/uL — ABNORMAL HIGH (ref 1.7–7.7)
Neutrophils Relative %: 83 % — ABNORMAL HIGH (ref 43–77)
PLATELETS: 183 10*3/uL (ref 150–400)
RBC: 5.71 MIL/uL (ref 4.22–5.81)
RDW: 16.5 % — AB (ref 11.5–15.5)
WBC: 17.5 10*3/uL — ABNORMAL HIGH (ref 4.0–10.5)

## 2015-05-27 LAB — I-STAT CG4 LACTIC ACID, ED
LACTIC ACID, VENOUS: 1.6 mmol/L (ref 0.5–2.0)
Lactic Acid, Venous: 1.42 mmol/L (ref 0.5–2.0)

## 2015-05-27 MED ORDER — POTASSIUM CHLORIDE CRYS ER 20 MEQ PO TBCR
40.0000 meq | EXTENDED_RELEASE_TABLET | Freq: Once | ORAL | Status: AC
  Administered 2015-05-27: 40 meq via ORAL
  Filled 2015-05-27: qty 2

## 2015-05-27 MED ORDER — ENOXAPARIN SODIUM 100 MG/ML ~~LOC~~ SOLN
130.0000 mg | Freq: Once | SUBCUTANEOUS | Status: DC
  Filled 2015-05-27: qty 2

## 2015-05-27 MED ORDER — VANCOMYCIN HCL IN DEXTROSE 1-5 GM/200ML-% IV SOLN
1000.0000 mg | Freq: Two times a day (BID) | INTRAVENOUS | Status: DC
  Administered 2015-05-28 – 2015-05-31 (×7): 1000 mg via INTRAVENOUS
  Filled 2015-05-27 (×8): qty 200

## 2015-05-27 MED ORDER — PIPERACILLIN-TAZOBACTAM 3.375 G IVPB
3.3750 g | Freq: Three times a day (TID) | INTRAVENOUS | Status: DC
  Administered 2015-05-28 – 2015-06-10 (×39): 3.375 g via INTRAVENOUS
  Filled 2015-05-27 (×45): qty 50

## 2015-05-27 MED ORDER — VANCOMYCIN HCL 10 G IV SOLR
1500.0000 mg | Freq: Once | INTRAVENOUS | Status: AC
  Administered 2015-05-27: 1500 mg via INTRAVENOUS
  Filled 2015-05-27: qty 1500

## 2015-05-27 MED ORDER — PIPERACILLIN-TAZOBACTAM 3.375 G IVPB 30 MIN
3.3750 g | Freq: Once | INTRAVENOUS | Status: AC
  Administered 2015-05-27: 3.375 g via INTRAVENOUS
  Filled 2015-05-27: qty 50

## 2015-05-27 MED ORDER — VANCOMYCIN HCL IN DEXTROSE 1-5 GM/200ML-% IV SOLN
1000.0000 mg | Freq: Once | INTRAVENOUS | Status: AC
  Administered 2015-05-27: 1000 mg via INTRAVENOUS
  Filled 2015-05-27: qty 200

## 2015-05-27 MED ORDER — ENOXAPARIN SODIUM 150 MG/ML ~~LOC~~ SOLN
130.0000 mg | Freq: Once | SUBCUTANEOUS | Status: AC
  Administered 2015-05-27: 130 mg via SUBCUTANEOUS
  Filled 2015-05-27: qty 1

## 2015-05-27 MED ORDER — SODIUM CHLORIDE 0.9 % IV BOLUS (SEPSIS)
500.0000 mL | Freq: Once | INTRAVENOUS | Status: AC
  Administered 2015-05-27: 500 mL via INTRAVENOUS

## 2015-05-27 NOTE — H&P (Signed)
[facility] History and Physical  Eduardo Herman AVW:[id number] DOB: 1951-01-16 DOA: 05/27/2015  Referring physician: Christ Kick, MD PCP: Alesia Richards, MD   Chief Complaint: Leg Swelling  HPI: Eduardo Herman is a 64 y.o. male with below past medical history who is brought by his father and sister to the emergency department due to right lower extremity swelling, redness, tenderness and discharge of purulent secretions. Upper relatives, the patient has been going to the wound clinic for over a month and was discharged last Wednesday, but since then they started noticing progressive worsening of his right lower extremity, particularly in the right last 2 days. Apparently the patient hasn't had a fever at home. He is unable to add further information due to his mental status. He looks in no acute distress.   Review of Systems:  Unable to obtain from the patient.  Past Medical History  Diagnosis Date  . Down's syndrome   . Hypertension   . Hypothyroidism   . Mental disorder   . Shortness of breath   . Peripheral vascular disease   . Angioedema of lips     likely secondary to ACE inhibitor.    Past Surgical History  Procedure Laterality Date  . Cholecystectomy     Social History:  reports that he has been smoking.  He has never used smokeless tobacco. He reports that he does not drink alcohol or use illicit drugs.  Allergies  Allergen Reactions  . Lisinopril Swelling    Angioedema of the tongue    History reviewed. No pertinent family history.   Prior to Admission medications   Medication Sig Start Date End Date Taking? Authorizing Provider  atenolol (TENORMIN) 100 MG tablet Take 100 mg by mouth daily.    Historical Provider, MD  diltiazem (TIAZAC) 180 MG 24 hr capsule Take 2 capsules (360 mg total) by mouth daily. 12/11/13   Vicie Mutters, PA-C  furosemide (LASIX) 80 MG tablet Take 80 mg by mouth 2 (two) times daily.    Historical Provider, MD  levofloxacin  (LEVAQUIN) 500 MG tablet Take 1 tablet (500 mg total) by mouth daily. 11/28/13   Vicie Mutters, PA-C  levothyroxine (SYNTHROID, LEVOTHROID) 200 MCG tablet Take 200 mcg by mouth daily.    Historical Provider, MD  Potassium Chloride ER 20 MEQ TBCR Take 20 mEq by mouth 3 (three) times daily. 12/11/13   Vicie Mutters, PA-C  predniSONE (DELTASONE) 20 MG tablet Take one pill two times daily for 3 days, take one pill daily for 4 days. 11/28/13   Vicie Mutters, PA-C  promethazine-codeine (PHENERGAN WITH CODEINE) 6.25-10 MG/5ML syrup Take 5 mLs by mouth every 6 (six) hours as needed for cough. 11/28/13   Vicie Mutters, PA-C   Physical Exam: Filed Vitals:   05/27/15 1900 05/27/15 2013 05/27/15 2100 05/27/15 2143  BP: 149/73 144/74 145/73   Pulse: 88 84 84   Temp:      TempSrc:      Resp: 19 22 23    Height:    6' (1.829 m)  Weight:    127.869 kg (281 lb 14.4 oz)  SpO2: 97% 96% 97%     Wt Readings from Last 3 Encounters:  05/27/15 127.869 kg (281 lb 14.4 oz)  11/28/13 142.429 kg (314 lb)  11/05/12 130.8 kg (288 lb 5.8 oz)    General:  Appears calm and comfortable Eyes: PERRL, normal lids, irises & conjunctiva ENT: grossly normal hearing, lips & tongue are dry. Neck: no LAD, masses or  thyromegaly Cardiovascular: RRR, no m/r/g. No LE edema. Telemetry: SR, no arrhythmias  Respiratory: CTA bilaterally, no w/r/r. Normal respiratory effort. Abdomen: soft, ntnd Skin: Thickening and erythema on both pretibial areas, right more than left. Musculoskeletal: Bilateral lower extremity edema, right more than left. Right pretibial area has a an area of the erythema, discoloration without loosing 1. Psychiatric: Unable to interact all other than unintelligible words. Neurologic: grossly non-focal.          Labs on Admission:  Basic Metabolic Panel:  Recent Labs Lab 05/27/15 1843  NA 138  K 3.0  CL 105  CO2 26  GLUCOSE 161  BUN 13  CREATININE 1.27  CALCIUM 8.5   Liver Function  Tests:  Recent Labs Lab 05/27/15 1843  AST 16  ALT 13  ALKPHOS 96  BILITOT 1.3  PROT 7.6  ALBUMIN 3.1   No results for input(s): LIPASE, AMYLASE in the last 168 hours. No results for input(s): AMMONIA in the last 168 hours. CBC:  Recent Labs Lab 05/27/15 1843  WBC 17.5  NEUTROABS 14.5  HGB 16.5  HCT 49.6  MCV 86.9  PLT 183   Cardiac Enzymes: No results for input(s): CKTOTAL, CKMB, CKMBINDEX, TROPONINI in the last 168 hours.  BNP (last 3 results) No results for input(s): BNP in the last 8760 hours.  ProBNP (last 3 results) No results for input(s): PROBNP in the last 8760 hours.  CBG: No results for input(s): GLUCAP in the last 168 hours.  Radiological Exams on Admission: Ct Head Wo Contrast  05/27/2015   CLINICAL DATA:  Right-sided weakness; 2 days ago his right leg became red and swollen. Pt is hot to touch with edema and weeping from that leg. Pt with hx of downs syndrome. Pt saw a wound specialist a couple of days ago but it was only cleaned. CBG-159 Bp-160/100 Hr-96 NSR 20g left hand. Pt able to stand on the leg  EXAM: CT HEAD WITHOUT CONTRAST  TECHNIQUE: Contiguous axial images were obtained from the base of the skull through the vertex without intravenous contrast.  COMPARISON:  None.  FINDINGS: Multiple missing teeth. Diffuse parenchymal atrophy. Patchy areas of hypoattenuation in deep and periventricular white matter bilaterally. Negative for acute intracranial hemorrhage, mass lesion, acute infarction, midline shift, or mass-effect. Acute infarct may be inapparent on noncontrast CT. Ventricles and sulci symmetric. Bone windows demonstrate no focal lesion.  IMPRESSION: 1. Negative for bleed or other acute intracranial process. 2. Atrophy and nonspecific white matter changes.   Electronically Signed   By: Lucrezia Europe M.D.   On: 05/27/2015 20:14    EKG: Independently reviewed.   Assessment/Plan Active Problems:   Cellulitis of right leg.    Hypertension.    Hypothyroidism.   Down Syndrome.     1. Started on broad-spectrum antibiotic coverage to cover for nosocomial pathogens. 2. Continue pertinent home meds. 3.  Supportive care.   Code Status: Full code. DVT Prophylaxis: Lovenox. Family Communication:  Disposition Plan: Home with outpatient follow-up.  Time spent: 60 minutes.  Reubin Milan [facility] Pager [phone]

## 2015-05-27 NOTE — ED Provider Notes (Signed)
CSN: [id number]     Arrival date & time 05/27/15  1752 History   First MD Initiated Contact with Patient 05/27/15 1800     Chief Complaint  Patient presents with  . Leg Pain  . Cellulitis     (Consider location/radiation/quality/duration/timing/severity/associated sxs/prior Treatment) HPI Eduardo Herman is a 64 y.o. male with history of Down syndrome, hypertension, peripheral vascular disease, presents to emergency room complaining of right leg pain and swelling. Patient unable to provide much history, family at bedside. Patient lives with his father and sister. Patient apparently has been treated for peripheral vascular disease and lower leg wounds by wound center. Patient was seen a week ago, the family stated that they released him saying he was doing well. They state in the last 2 days patient's right leg became increasingly swollen, red, painful. Family deny any known fever. There are no other associated complaints. Family stated the patient is not very active at baseline. His pants most of his day on the couch and only gets up to smoke and to go to the bathroom. Family denies history of similar complaint in the past.  Past Medical History  Diagnosis Date  . Down's syndrome   . Hypertension   . Hypothyroidism   . Mental disorder   . Shortness of breath   . Peripheral vascular disease   . Angioedema of lips     likely secondary to ACE inhibitor.    Past Surgical History  Procedure Laterality Date  . Cholecystectomy     History reviewed. No pertinent family history. History  Substance Use Topics  . Smoking status: Current Every Day Smoker  . Smokeless tobacco: Never Used  . Alcohol Use: No    Review of Systems  Unable to perform ROS: Other      Allergies  Lisinopril  Home Medications   Prior to Admission medications   Medication Sig Start Date End Date Taking? Authorizing Provider  atenolol (TENORMIN) 100 MG tablet Take 100 mg by mouth daily.    Historical Provider,  MD  diltiazem (TIAZAC) 180 MG 24 hr capsule Take 2 capsules (360 mg total) by mouth daily. 12/11/13   Vicie Mutters, PA-C  furosemide (LASIX) 80 MG tablet Take 80 mg by mouth 2 (two) times daily.    Historical Provider, MD  levofloxacin (LEVAQUIN) 500 MG tablet Take 1 tablet (500 mg total) by mouth daily. 11/28/13   Vicie Mutters, PA-C  levothyroxine (SYNTHROID, LEVOTHROID) 200 MCG tablet Take 200 mcg by mouth daily.    Historical Provider, MD  Potassium Chloride ER 20 MEQ TBCR Take 20 mEq by mouth 3 (three) times daily. 12/11/13   Vicie Mutters, PA-C  predniSONE (DELTASONE) 20 MG tablet Take one pill two times daily for 3 days, take one pill daily for 4 days. 11/28/13   Vicie Mutters, PA-C  promethazine-codeine (PHENERGAN WITH CODEINE) 6.25-10 MG/5ML syrup Take 5 mLs by mouth every 6 (six) hours as needed for cough. 11/28/13   Vicie Mutters, PA-C   BP 149/73 mmHg  Pulse 88  Temp(Src) 100.3 F (37.9 C) (Oral)  Resp 19  SpO2 97% Physical Exam  Constitutional: He appears well-developed and well-nourished. No distress.  Morbidly obese  HENT:  Head: Normocephalic and atraumatic.  Eyes: Conjunctivae are normal.  Neck: Neck supple.  Cardiovascular: Normal rate, regular rhythm and normal heart sounds.   Pulmonary/Chest: Effort normal. No respiratory distress. He has no wheezes. He has no rales.  Abdominal: Soft. Bowel sounds are normal. He exhibits no distension.  There is no tenderness. There is no rebound.  Musculoskeletal:  Right lower leg erythematous, swollen, indurated from just distal to the knee all the way to the ankle. There are several ulcerations. No drainage. Warm to the touch. Dorsal pedal pulses are intact and equal bilaterally.  Neurological: He is alert.  Patient is able to answer simple questions. Speech is slurred. Out of 5 and equal grip strength bilaterally, patient unable to do finger to nose or heel to shin. Most likely at baseline.  Skin: Skin is warm and dry.  Nursing  note and vitals reviewed.   ED Course  Procedures (including critical care time) Labs Review Labs Reviewed  CBC WITH DIFFERENTIAL/PLATELET - Abnormal; Notable for the following:    WBC 17.5 (*)    RDW 16.5 (*)    Neutrophils Relative % 83 (*)    Neutro Abs 14.5 (*)    Lymphocytes Relative 8 (*)    Monocytes Absolute 1.5 (*)    All other components within normal limits  COMPREHENSIVE METABOLIC PANEL - Abnormal; Notable for the following:    Potassium 3.0 (*)    Glucose, Bld 161 (*)    Creatinine, Ser 1.27 (*)    Calcium 8.5 (*)    Albumin 3.1 (*)    ALT 13 (*)    Total Bilirubin 1.3 (*)    GFR calc non Af Amer 58 (*)    All other components within normal limits  I-STAT CG4 LACTIC ACID, ED  I-STAT CG4 LACTIC ACID, ED    Imaging Review Ct Head Wo Contrast  05/27/2015   CLINICAL DATA:  Right-sided weakness; 2 days ago his right leg became red and swollen. Pt is hot to touch with edema and weeping from that leg. Pt with hx of downs syndrome. Pt saw a wound specialist a couple of days ago but it was only cleaned. CBG-159 Bp-160/100 Hr-96 NSR 20g left hand. Pt able to stand on the leg  EXAM: CT HEAD WITHOUT CONTRAST  TECHNIQUE: Contiguous axial images were obtained from the base of the skull through the vertex without intravenous contrast.  COMPARISON:  None.  FINDINGS: Multiple missing teeth. Diffuse parenchymal atrophy. Patchy areas of hypoattenuation in deep and periventricular white matter bilaterally. Negative for acute intracranial hemorrhage, mass lesion, acute infarction, midline shift, or mass-effect. Acute infarct may be inapparent on noncontrast CT. Ventricles and sulci symmetric. Bone windows demonstrate no focal lesion.  IMPRESSION: 1. Negative for bleed or other acute intracranial process. 2. Atrophy and nonspecific white matter changes.   Electronically Signed   By: Lucrezia Europe M.D.   On: 05/27/2015 20:14     EKG Interpretation None      MDM   Final diagnoses:   Cellulitis of right leg  Right sided weakness     patient with right leg cellulitis, cannot rule out DVT at this time. Will order Lovenox. Vancomycin Zosyn ordered for the infection. IV fluids started. Vital signs are normal at this time. Labs pending. At this time no evidence of sepsis.   Patient's family just informed me that concerned about patient having right-sided weakness. On exam he is unable to follow commands and unable to do finger to nose or heel to shin most likely due to his mental disorder. He seems to have equal grip strength bilaterally. There is mildly slight right facial droop, however patient also does not have his teeth in so difficult to examine. CT head ordered.  Labs showing elevated white blood cell count of 17.5.  Lactic acid is normal. Slightly elevated creatinine of 1.27, off patient's baseline. Patient received IV fluids. Patient does not appear to be in any distress.  8:51 PM CT negative, discussed with [facility], they will admit patient.  Filed Vitals:   05/27/15 2013 05/27/15 2100 05/27/15 2143 05/27/15 2347  BP: 144/74 145/73  148/63  Pulse: 84 84  79  Temp:      TempSrc:      Resp: 22 23  16   Height:   6' (1.829 m)   Weight:   281 lb 14.4 oz (127.869 kg)   SpO2: 96% 97%  97%     Jeannett Senior, PA-C 05/28/15 0048  Daleen Bo, MD 05/30/15 [phone]

## 2015-05-27 NOTE — ED Notes (Signed)
Patient transported to CT 

## 2015-05-27 NOTE — Progress Notes (Signed)
ANTIBIOTIC CONSULT NOTE - INITIAL  Pharmacy Consult for zosyn, vancomycin Indication: cellulits  Allergies  Allergen Reactions  . Lisinopril Swelling    Angioedema of the tongue    Patient Measurements: Height: 6' (182.9 cm) Weight: 281 lb 14.4 oz (127.869 kg) IBW/kg (Calculated) : 77.6   Vital Signs: Temp: 100.3 F (37.9 C) (07/26 1758) Temp Source: Oral (07/26 1758) BP: 145/73 mmHg (07/26 2100) Pulse Rate: 84 (07/26 2100) Intake/Output from previous day:   Intake/Output from this shift: Total I/O In: 770 [I.V.:770] Out: -   Labs:  Recent Labs  05/27/15 1843  WBC 17.5*  HGB 16.5  PLT 183  CREATININE 1.27*   Estimated Creatinine Clearance: 80.1 mL/min (by C-G formula based on Cr of 1.27). No results for input(s): VANCOTROUGH, VANCOPEAK, VANCORANDOM, GENTTROUGH, GENTPEAK, GENTRANDOM, TOBRATROUGH, TOBRAPEAK, TOBRARND, AMIKACINPEAK, AMIKACINTROU, AMIKACIN in the last 72 hours.   Microbiology: No results found for this or any previous visit (from the past 720 hour(s)).  Medical History: Past Medical History  Diagnosis Date  . Down's syndrome   . Hypertension   . Hypothyroidism   . Mental disorder   . Shortness of breath   . Peripheral vascular disease   . Angioedema of lips     likely secondary to ACE inhibitor.     Assessment: 64 yo male with R leg cellulitis to begin vancomycin and zosyn per pharmacy. WBC= 17.5, tmax= 100.3, SCr= 1.27 and CrCl ~ 80 (noted wt of 127kg; normalized CrCl ~ 60). A total of 2500mg  vancomycin to be given in the ED.   7/26 vanc 7/26 zosyn  Goal of Therapy:  Vancomycin trough level 10-15 mcg/ml  Plan:  -Continue vancomycin 1000mg  IV q12h -Zosyn 3.375gm IV q8h -Will follow renal function, cultures and clinical progress  Hildred Laser, Pharm D 05/27/2015 10:19 PM

## 2015-05-27 NOTE — ED Provider Notes (Signed)
  Face-to-face evaluation   History: Patient with leg swelling for 2 days, he was being seen by wound specialist in the last week  Physical exam: Alert, calm, cooperative. Right leg swollen, red, with very superficial ulcerations. No proximal streaking.  Medical screening examination/treatment/procedure(s) were conducted as a shared visit with non-physician practitioner(s) and myself.  I personally evaluated the patient during the encounter  Daleen Bo, MD 05/30/15 [phone]

## 2015-05-27 NOTE — ED Notes (Signed)
Pt getting vancomycin iv

## 2015-05-27 NOTE — ED Notes (Signed)
Pt to department via EMS- pt family reports 2 days ago his right leg became red and swollen. Pt is hot to touch with edema and weeping from that leg. Pt with hx of downs syndrome. Pt saw a wound specialist a couple of days ago but it was only cleaned. CBG-159 Bp-160/100 Hr-96 NSR 20g left hand. Pt able to stand on the leg.

## 2015-05-28 ENCOUNTER — Encounter (HOSPITAL_COMMUNITY): Payer: Self-pay

## 2015-05-28 ENCOUNTER — Other Ambulatory Visit: Payer: Self-pay | Admitting: Internal Medicine

## 2015-05-28 ENCOUNTER — Ambulatory Visit (HOSPITAL_COMMUNITY): Payer: Medicare Other

## 2015-05-28 DIAGNOSIS — R609 Edema, unspecified: Secondary | ICD-10-CM

## 2015-05-28 LAB — COMPREHENSIVE METABOLIC PANEL
ALT: 11 U/L — ABNORMAL LOW (ref 17–63)
ANION GAP: 8 (ref 5–15)
AST: 16 U/L (ref 15–41)
Albumin: 2.7 g/dL — ABNORMAL LOW (ref 3.5–5.0)
Alkaline Phosphatase: 86 U/L (ref 38–126)
BUN: 12 mg/dL (ref 6–20)
CHLORIDE: 109 mmol/L (ref 101–111)
CO2: 23 mmol/L (ref 22–32)
CREATININE: 1.32 mg/dL — AB (ref 0.61–1.24)
Calcium: 8.1 mg/dL — ABNORMAL LOW (ref 8.9–10.3)
GFR calc Af Amer: >60 mL/min (ref >60)
GFR calc non Af Amer: 55 mL/min — ABNORMAL LOW (ref >60)
GLUCOSE: 139 mg/dL — AB (ref 65–99)
Potassium: 3.4 mmol/L — ABNORMAL LOW (ref 3.5–5.1)
SODIUM: 140 mmol/L (ref 135–145)
Total Bilirubin: 2.1 mg/dL — ABNORMAL HIGH (ref 0.3–1.2)
Total Protein: 6.6 g/dL (ref 6.5–8.1)

## 2015-05-28 LAB — URINALYSIS, ROUTINE W REFLEX MICROSCOPIC
BILIRUBIN URINE: NEGATIVE
GLUCOSE, UA: NEGATIVE mg/dL
Hgb urine dipstick: NEGATIVE
KETONES UR: NEGATIVE mg/dL
LEUKOCYTES UA: NEGATIVE
NITRITE: NEGATIVE
PH: 5.5 (ref 5.0–8.0)
Protein, ur: NEGATIVE mg/dL
Specific Gravity, Urine: 1.019 (ref 1.005–1.030)
Urobilinogen, UA: 1 mg/dL (ref 0.0–1.0)

## 2015-05-28 LAB — CBC
HCT: 46.2 % (ref 39.0–52.0)
Hemoglobin: 15.1 g/dL (ref 13.0–17.0)
MCH: 28.7 pg (ref 26.0–34.0)
MCHC: 32.7 g/dL (ref 30.0–36.0)
MCV: 87.7 fL (ref 78.0–100.0)
Platelets: 190 10*3/uL (ref 150–400)
RBC: 5.27 MIL/uL (ref 4.22–5.81)
RDW: 16.5 % — AB (ref 11.5–15.5)
WBC: 16.3 10*3/uL — ABNORMAL HIGH (ref 4.0–10.5)

## 2015-05-28 LAB — MAGNESIUM: Magnesium: 1.8 mg/dL (ref 1.7–2.4)

## 2015-05-28 MED ORDER — ENOXAPARIN SODIUM 150 MG/ML ~~LOC~~ SOLN
130.0000 mg | Freq: Two times a day (BID) | SUBCUTANEOUS | Status: DC
  Filled 2015-05-28: qty 1

## 2015-05-28 MED ORDER — ONDANSETRON HCL 4 MG PO TABS: 4.0000 mg | ORAL_TABLET | Freq: Four times a day (QID) | ORAL | Status: DC | PRN

## 2015-05-28 MED ORDER — ENOXAPARIN SODIUM 40 MG/0.4ML ~~LOC~~ SOLN
40.0000 mg | SUBCUTANEOUS | Status: DC
  Administered 2015-05-28: 40 mg via SUBCUTANEOUS
  Filled 2015-05-28: qty 0.4

## 2015-05-28 MED ORDER — ENOXAPARIN SODIUM 150 MG/ML ~~LOC~~ SOLN
130.0000 mg | Freq: Once | SUBCUTANEOUS | Status: AC
  Administered 2015-05-28: 130 mg via SUBCUTANEOUS
  Filled 2015-05-28 (×2): qty 1

## 2015-05-28 MED ORDER — OXYCODONE HCL 5 MG PO TABS
5.0000 mg | ORAL_TABLET | ORAL | Status: DC | PRN
  Administered 2015-05-29 – 2015-06-04 (×3): 5 mg via ORAL
  Filled 2015-05-28 (×3): qty 1

## 2015-05-28 MED ORDER — POTASSIUM CHLORIDE IN NACL 20-0.9 MEQ/L-% IV SOLN
INTRAVENOUS | Status: DC
  Administered 2015-05-28 – 2015-05-29 (×5): via INTRAVENOUS
  Filled 2015-05-28 (×5): qty 1000

## 2015-05-28 MED ORDER — ONDANSETRON HCL 4 MG/2ML IJ SOLN: 4.0000 mg | Freq: Four times a day (QID) | INTRAMUSCULAR | Status: DC | PRN

## 2015-05-28 MED ORDER — VANCOMYCIN HCL 10 G IV SOLR: 1500.0000 mg | Freq: Two times a day (BID) | INTRAVENOUS | Status: DC

## 2015-05-28 MED ORDER — ASPIRIN EC 81 MG PO TBEC
81.0000 mg | DELAYED_RELEASE_TABLET | Freq: Every day | ORAL | Status: DC
  Administered 2015-05-28 – 2015-06-04 (×8): 81 mg via ORAL
  Filled 2015-05-28 (×9): qty 1

## 2015-05-28 NOTE — Progress Notes (Signed)
   PROGRESS NOTE  Eduardo Herman BEE:[id number] DOB: 02-09-1951 DOA: 05/27/2015 PCP: No primary care provider on file.  HPI/Recap of past 24 hours:  Fever subsided, father in room  Assessment/Plan: Active Problems:   Cellulitis of right leg  Right lower extremity cellulitis: venous US negative for DVT. Continue vanc/zosyn. Father reported patient has been treated for this for the past several weeks at wound center.  Sepsis with leukocytosis/fever/celullitis: ivf/abx  Cr elevation: likely due to sepsis. ua pending. Renal dosing meds.  H/o HTN: home meds atenolol/cardizem/lasix held due to sepsis. Resume when appropriate.   Code Status: full  Family Communication: father  Disposition Plan: home when medically stable   Consultants:  none  Procedures:  none  Antibiotics:  vanc/zosyn   Objective: BP 142/81 mmHg  Pulse 77  Temp(Src) 98.4 F (36.9 C) (Oral)  Resp 21  Ht 6' (1.829 m)  Wt 127.869 kg (281 lb 14.4 oz)  BMI 38.22 kg/m2  SpO2 91%  Intake/Output Summary (Last 24 hours) at 05/28/15 2038 Last data filed at 05/28/15 1400  Gross per 24 hour  Intake 1881.67 ml  Output      0 ml  Net 1881.67 ml   Filed Weights   05/27/15 2143  Weight: 127.869 kg (281 lb 14.4 oz)    Exam:   General:  NAD  Cardiovascular: RRR  Respiratory: CTABL  Abdomen: Soft/ND/NT, positive BS  Musculoskeletal: right lower extremity erythematous/edematous/ superficial ulcerations/blisters.  Neuro: baseline mental retardation  Data Reviewed: Basic Metabolic Panel:  Recent Labs Lab 05/27/15 1843 05/28/15 0425  NA 138 140  K 3.0* 3.4*  CL 105 109  CO2 26 23  GLUCOSE 161* 139*  BUN 13 12  CREATININE 1.27* 1.32*  CALCIUM 8.5* 8.1*  MG  --  1.8   Liver Function Tests:  Recent Labs Lab 05/27/15 1843 05/28/15 0425  AST 16 16  ALT 13* 11*  ALKPHOS 96 86  BILITOT 1.3* 2.1*  PROT 7.6 6.6  ALBUMIN 3.1* 2.7*   No results for input(s): LIPASE, AMYLASE in the  last 168 hours. No results for input(s): AMMONIA in the last 168 hours. CBC:  Recent Labs Lab 05/27/15 1843 05/28/15 0425  WBC 17.5* 16.3*  NEUTROABS 14.5*  --   HGB 16.5 15.1  HCT 49.6 46.2  MCV 86.9 87.7  PLT 183 190   Cardiac Enzymes:   No results for input(s): CKTOTAL, CKMB, CKMBINDEX, TROPONINI in the last 168 hours. BNP (last 3 results) No results for input(s): BNP in the last 8760 hours.  ProBNP (last 3 results) No results for input(s): PROBNP in the last 8760 hours.  CBG: No results for input(s): GLUCAP in the last 168 hours.  No results found for this or any previous visit (from the past 240 hour(s)).   Studies: No results found.  Scheduled Meds: . aspirin EC  81 mg Oral Daily  . [START ON 05/29/2015] enoxaparin (LOVENOX) injection  40 mg Subcutaneous Q24H  . piperacillin-tazobactam (ZOSYN)  IV  3.375 g Intravenous Q8H  . vancomycin  1,000 mg Intravenous Q12H    Continuous Infusions: . 0.9 % NaCl with KCl 20 mEq / L 100 mL/hr at 05/28/15 1543     Time spent: 72mins  Myia Bergh MD, PhD  [facility] Pager [phone]. If 7PM-7AM, please contact night-coverage at [email or web address], password [facility] 05/28/2015, 8:38 PM  LOS: 1 day

## 2015-05-28 NOTE — ED Notes (Signed)
Vancomycin almost infused.  Pt alert  No distress

## 2015-05-28 NOTE — Progress Notes (Signed)
Pt admitted to 6N06 from ED.

## 2015-05-28 NOTE — ED Notes (Signed)
Report called  To rn on 6n

## 2015-05-28 NOTE — Progress Notes (Signed)
VASCULAR LAB PRELIMINARY  PRELIMINARY  PRELIMINARY  PRELIMINARY  Right lower extremity venous duplex completed.    Preliminary report:  Right:  No evidence of DVT, superficial thrombosis, or Baker's cyst.  Eduardo Herman, RVT 05/28/2015, 2:50 PM

## 2015-05-28 NOTE — Progress Notes (Signed)
ANTICOAGULATION CONSULT NOTE - Initial Consult  Pharmacy Consult:  Lovenox Indication:  Rule out DVT  Allergies  Allergen Reactions  . Lisinopril Swelling    Angioedema of the tongue    Patient Measurements: Height: 6' (182.9 cm) Weight: 281 lb 14.4 oz (127.869 kg) IBW/kg (Calculated) : 77.6  Vital Signs: Temp: 98.4 F (36.9 C) (07/27 0416) Temp Source: Oral (07/27 0416) BP: 142/81 mmHg (07/27 0416) Pulse Rate: 77 (07/27 0416)  Labs:  Recent Labs  05/27/15 1843 05/28/15 0425  HGB 16.5 15.1  HCT 49.6 46.2  PLT 183 190  CREATININE 1.27* 1.32*    Estimated Creatinine Clearance: 77.1 mL/min (by C-G formula based on Cr of 1.32).   Medical History: Past Medical History  Diagnosis Date  . Down's syndrome   . Hypertension   . Hypothyroidism   . Mental disorder   . Shortness of breath   . Peripheral vascular disease   . Angioedema of lips     likely secondary to ACE inhibitor.      Assessment: 69 YOM presented with leg swelling and received one dose of Lovenox in the ED last night for rule out DVT.  Doppler not yet ordered and today's MD decided to continue Lovenox pending Doppler result.  Patient has AKI; however, calculated CrCL remains appropriate for BID dosing of Lovenox.  No bleeding reported.   Goal of Therapy:  Anti-Xa level 0.6-1 units/ml 4hrs after LMWH dose given Monitor platelets by anticoagulation protocol: Yes  Vanc trough 10-15 mcg/mL    Plan:  - Lovenox 130mg  SQ Q12H - CBC Q72H while on Lovenox - F/U Doppler result - Vanc 1gm IV Q12H - Zosyn 3.375gm IV Q8H, 4 hr infusion - Monitor renal fxn, clinical progress, vanc trough at Css - F/U resume home meds as appropriate    Lachele Lievanos D. Mina Marble, PharmD, BCPS Pager:  [phone] 05/28/2015, 9:47 AM

## 2015-05-28 NOTE — ED Notes (Signed)
Vancomycin infused  Zosyn  Added to iv

## 2015-05-29 LAB — COMPREHENSIVE METABOLIC PANEL
ALBUMIN: 2.2 g/dL — AB (ref 3.5–5.0)
ALT: 10 U/L — ABNORMAL LOW (ref 17–63)
AST: 15 U/L (ref 15–41)
Alkaline Phosphatase: 88 U/L (ref 38–126)
Anion gap: 7 (ref 5–15)
BUN: 9 mg/dL (ref 6–20)
CHLORIDE: 111 mmol/L (ref 101–111)
CO2: 22 mmol/L (ref 22–32)
Calcium: 7.7 mg/dL — ABNORMAL LOW (ref 8.9–10.3)
Creatinine, Ser: 1.3 mg/dL — ABNORMAL HIGH (ref 0.61–1.24)
GFR calc Af Amer: >60 mL/min (ref >60)
GFR calc non Af Amer: 56 mL/min — ABNORMAL LOW (ref >60)
Glucose, Bld: 110 mg/dL — ABNORMAL HIGH (ref 65–99)
Potassium: 3.2 mmol/L — ABNORMAL LOW (ref 3.5–5.1)
Sodium: 140 mmol/L (ref 135–145)
Total Bilirubin: 1.8 mg/dL — ABNORMAL HIGH (ref 0.3–1.2)
Total Protein: 5.9 g/dL — ABNORMAL LOW (ref 6.5–8.1)

## 2015-05-29 LAB — CBC
HEMATOCRIT: 42.8 % (ref 39.0–52.0)
Hemoglobin: 14 g/dL (ref 13.0–17.0)
MCH: 28.7 pg (ref 26.0–34.0)
MCHC: 32.7 g/dL (ref 30.0–36.0)
MCV: 87.9 fL (ref 78.0–100.0)
Platelets: 177 10*3/uL (ref 150–400)
RBC: 4.87 MIL/uL (ref 4.22–5.81)
RDW: 16.5 % — AB (ref 11.5–15.5)
WBC: 12.3 10*3/uL — ABNORMAL HIGH (ref 4.0–10.5)

## 2015-05-29 LAB — HEMOGLOBIN A1C
HEMOGLOBIN A1C: 5.9 % — AB (ref 4.8–5.6)
Mean Plasma Glucose: 123 mg/dL

## 2015-05-29 MED ORDER — ATENOLOL 50 MG PO TABS
100.0000 mg | ORAL_TABLET | Freq: Every day | ORAL | Status: DC
  Administered 2015-05-29 – 2015-06-10 (×12): 100 mg via ORAL
  Filled 2015-05-29: qty 4
  Filled 2015-05-29 (×12): qty 2

## 2015-05-29 MED ORDER — DILTIAZEM HCL ER COATED BEADS 180 MG PO CP24
360.0000 mg | ORAL_CAPSULE | Freq: Every day | ORAL | Status: DC
  Administered 2015-05-29 – 2015-06-10 (×13): 360 mg via ORAL
  Filled 2015-05-29 (×13): qty 2

## 2015-05-29 MED ORDER — ENOXAPARIN SODIUM 40 MG/0.4ML ~~LOC~~ SOLN
40.0000 mg | SUBCUTANEOUS | Status: DC
  Administered 2015-05-30 – 2015-06-04 (×6): 40 mg via SUBCUTANEOUS
  Filled 2015-05-29 (×6): qty 0.4

## 2015-05-29 MED ORDER — POTASSIUM CHLORIDE CRYS ER 20 MEQ PO TBCR
40.0000 meq | EXTENDED_RELEASE_TABLET | Freq: Once | ORAL | Status: AC
  Administered 2015-05-29: 40 meq via ORAL
  Filled 2015-05-29: qty 2

## 2015-05-29 MED ORDER — ENOXAPARIN SODIUM 30 MG/0.3ML ~~LOC~~ SOLN
30.0000 mg | SUBCUTANEOUS | Status: DC
  Administered 2015-05-29: 30 mg via SUBCUTANEOUS
  Filled 2015-05-29: qty 0.3

## 2015-05-29 NOTE — Progress Notes (Addendum)
PROGRESS NOTE  Eduardo Herman [id number] DOB: June 25, 1951 DOA: 05/27/2015 PCP: No primary care provider on file.  HPI/Recap of past 24 hours: No complaints, no family at bedside  Assessment/Plan:  Right lower extremity cellulitis: -venous Duplex negative for DVT. Continue vanc/zosyn. -has chronic venous stasis changes and previously followed at the wound center Father reported patient has been treated for this for the past several weeks at wound center.  Sepsis with leukocytosis/fever/celullitis: ivf/abx -improving  Mild AKi vs CKD - likely due to sepsis. Hydrate, stable, Renal dosing meds.  H/o HTN: home meds atenolol/cardizem/lasix held due to sepsis. Resume when appropriate.  Hypokalemia -replace  Mental retardation  Borderline DM -SSI  DVT proph: lovenox  Code Status: full Family Communication: none at bedside Disposition Plan: home when medically stable   Consultants:  none  Procedures:  none  Antibiotics:  vanc/zosyn   Objective: BP 152/78 mmHg  Pulse 82  Temp(Src) 98.6 F (37 C) (Oral)  Resp 18  Ht 6' (1.829 m)  Wt 127.869 kg (281 lb 14.4 oz)  BMI 38.22 kg/m2  SpO2 100%  Intake/Output Summary (Last 24 hours) at 05/29/15 1438 Last data filed at 05/29/15 0900  Gross per 24 hour  Intake   1835 ml  Output      0 ml  Net   1835 ml   Filed Weights   05/27/15 2143  Weight: 127.869 kg (281 lb 14.4 oz)    Exam:   General:  NAD  Cardiovascular: RRR  Respiratory: CTABL  Abdomen: Soft/ND/NT, positive BS  Musculoskeletal: right lower extremity erythematous/edematous/ superficial ulcerations/blisters.  Neuro: baseline mental retardation  Data Reviewed: Basic Metabolic Panel:  Recent Labs Lab 05/27/15 1843 05/28/15 0425 05/29/15 0344  NA 138 140 140  K 3.0* 3.4* 3.2*  CL 105 109 111  CO2 26 23 22   GLUCOSE 161* 139* 110*  BUN 13 12 9   CREATININE 1.27* 1.32* 1.30*  CALCIUM 8.5* 8.1* 7.7*  MG  --  1.8  --    Liver  Function Tests:  Recent Labs Lab 05/27/15 1843 05/28/15 0425 05/29/15 0344  AST 16 16 15   ALT 13* 11* 10*  ALKPHOS 96 86 88  BILITOT 1.3* 2.1* 1.8*  PROT 7.6 6.6 5.9*  ALBUMIN 3.1* 2.7* 2.2*   No results for input(s): LIPASE, AMYLASE in the last 168 hours. No results for input(s): AMMONIA in the last 168 hours. CBC:  Recent Labs Lab 05/27/15 1843 05/28/15 0425 05/29/15 0344  WBC 17.5* 16.3* 12.3*  NEUTROABS 14.5*  --   --   HGB 16.5 15.1 14.0  HCT 49.6 46.2 42.8  MCV 86.9 87.7 87.9  PLT 183 190 177   Cardiac Enzymes:   No results for input(s): CKTOTAL, CKMB, CKMBINDEX, TROPONINI in the last 168 hours. BNP (last 3 results) No results for input(s): BNP in the last 8760 hours.  ProBNP (last 3 results) No results for input(s): PROBNP in the last 8760 hours.  CBG: No results for input(s): GLUCAP in the last 168 hours.  No results found for this or any previous visit (from the past 240 hour(s)).   Studies: No results found.  Scheduled Meds: . aspirin EC  81 mg Oral Daily  . atenolol  100 mg Oral Daily  . diltiazem  360 mg Oral Daily  . [START ON 05/30/2015] enoxaparin (LOVENOX) injection  40 mg Subcutaneous Q24H  . piperacillin-tazobactam (ZOSYN)  IV  3.375 g Intravenous Q8H  . vancomycin  1,000 mg Intravenous Q12H  Continuous Infusions: . 0.9 % NaCl with KCl 20 mEq / L 50 mL/hr at 05/29/15 1015     Time spent: 65mins  Camree Wigington MD  [facility] Pager [phone]. If 7PM-7AM, please contact night-coverage at [email or web address], password [facility] 05/29/2015, 2:38 PM  LOS: 2 days

## 2015-05-29 NOTE — Care Management Important Message (Signed)
Important Message  Patient Details  Name: Eduardo Herman MRN: [id number] Date of Birth: 10-05-1951   Medicare Important Message Given:  Yes-second notification given    Delorse Lek 05/29/2015, 11:08 AM

## 2015-05-30 LAB — COMPREHENSIVE METABOLIC PANEL
ALBUMIN: 2.2 g/dL — AB (ref 3.5–5.0)
ALK PHOS: 106 U/L (ref 38–126)
ALT: 11 U/L — ABNORMAL LOW (ref 17–63)
AST: 15 U/L (ref 15–41)
Anion gap: 8 (ref 5–15)
BUN: 13 mg/dL (ref 6–20)
CALCIUM: 8.1 mg/dL — AB (ref 8.9–10.3)
CO2: 24 mmol/L (ref 22–32)
Chloride: 107 mmol/L (ref 101–111)
Creatinine, Ser: 1.43 mg/dL — ABNORMAL HIGH (ref 0.61–1.24)
GFR calc Af Amer: 58 mL/min — ABNORMAL LOW (ref >60)
GFR calc non Af Amer: 50 mL/min — ABNORMAL LOW (ref >60)
Glucose, Bld: 100 mg/dL — ABNORMAL HIGH (ref 65–99)
Potassium: 3.4 mmol/L — ABNORMAL LOW (ref 3.5–5.1)
Sodium: 139 mmol/L (ref 135–145)
TOTAL PROTEIN: 6.2 g/dL — AB (ref 6.5–8.1)
Total Bilirubin: 1.5 mg/dL — ABNORMAL HIGH (ref 0.3–1.2)

## 2015-05-30 LAB — CBC
HCT: 45.3 % (ref 39.0–52.0)
Hemoglobin: 14.6 g/dL (ref 13.0–17.0)
MCH: 28.4 pg (ref 26.0–34.0)
MCHC: 32.2 g/dL (ref 30.0–36.0)
MCV: 88.1 fL (ref 78.0–100.0)
PLATELETS: 202 10*3/uL (ref 150–400)
RBC: 5.14 MIL/uL (ref 4.22–5.81)
RDW: 16.4 % — AB (ref 11.5–15.5)
WBC: 13.5 10*3/uL — AB (ref 4.0–10.5)

## 2015-05-30 MED ORDER — FUROSEMIDE 80 MG PO TABS
80.0000 mg | ORAL_TABLET | Freq: Every day | ORAL | Status: DC
  Administered 2015-05-30 – 2015-06-07 (×9): 80 mg via ORAL
  Filled 2015-05-30 (×9): qty 1

## 2015-05-30 NOTE — Progress Notes (Signed)
ANTIBIOTIC CONSULT NOTE - FOLLOW UP  Pharmacy Consult for Vancomycin and Zosyn Indication: RLE cellulitis  Allergies  Allergen Reactions  . Lisinopril Swelling    Angioedema of the tongue    Patient Measurements: Height: 6' (182.9 cm) Weight: 281 lb 14.4 oz (127.869 kg) IBW/kg (Calculated) : 77.6  Vital Signs: Temp: 98 F (36.7 C) (07/29 0507) Temp Source: Oral (07/29 0507) BP: 140/74 mmHg (07/29 0507) Pulse Rate: 71 (07/29 0507) Intake/Output from previous day: 07/28 0701 - 07/29 0700 In: 2347.5 [P.O.:600; I.V.:1397.5; IV Piggyback:350] Out: 1 [Urine:1] Intake/Output from this shift:    Labs:  Recent Labs  05/28/15 0425 05/29/15 0344 05/30/15 0336  WBC 16.3* 12.3* 13.5*  HGB 15.1 14.0 14.6  PLT 190 177 202  CREATININE 1.32* 1.30* 1.43*   Estimated Creatinine Clearance: 71.2 mL/min (by C-G formula based on Cr of 1.43). No results for input(s): VANCOTROUGH, VANCOPEAK, VANCORANDOM, GENTTROUGH, GENTPEAK, GENTRANDOM, TOBRATROUGH, TOBRAPEAK, TOBRARND, AMIKACINPEAK, AMIKACINTROU, AMIKACIN in the last 72 hours.   Microbiology: No results found for this or any previous visit (from the past 720 hour(s)).  Anti-infectives    Start     Dose/Rate Route Frequency Ordered Stop   05/28/15 1100  vancomycin (VANCOCIN) IVPB 1000 mg/200 mL premix     1,000 mg 200 mL/hr over 60 Minutes Intravenous Every 12 hours 05/27/15 2222     05/28/15 1000  vancomycin (VANCOCIN) 1,500 mg in sodium chloride 0.9 % 500 mL IVPB  Status:  Discontinued     1,500 mg 250 mL/hr over 120 Minutes Intravenous Every 12 hours 05/28/15 0400 05/28/15 0406   05/28/15 0100  piperacillin-tazobactam (ZOSYN) IVPB 3.375 g     3.375 g 12.5 mL/hr over 240 Minutes Intravenous Every 8 hours 05/27/15 2222     05/27/15 2130  vancomycin (VANCOCIN) 1,500 mg in sodium chloride 0.9 % 500 mL IVPB     1,500 mg 250 mL/hr over 120 Minutes Intravenous  Once 05/27/15 2120 05/28/15 0138   05/27/15 1945  vancomycin (VANCOCIN)  IVPB 1000 mg/200 mL premix     1,000 mg 200 mL/hr over 60 Minutes Intravenous  Once 05/27/15 1935 05/27/15 2154   05/27/15 1945  piperacillin-tazobactam (ZOSYN) IVPB 3.375 g     3.375 g 100 mL/hr over 30 Minutes Intravenous  Once 05/27/15 1935 05/27/15 2036      Assessment: 64 yo male with R leg cellulitis to begin vancomycin and zosyn per pharmacy. Now D#4 for RLE cellulitis, Tmax 100.3, now afebrile and WBC 13.5. [place] 1.43 (baseline 0.99), CrCL ~70 ml/min (normalized CrCL 55 ml/min).   Goal of Therapy:  Vancomycin trough level 10-15 mcg/ml  Resolution of infection  Plan:  Continue Vancomycin 1gm IV Q12H Continue Zosyn 3.375gm IV Q8H, 4 hr infusion Monitor clinical picture, renal function, VT prn F/U C&S, abx deescalation / LOT  Paulmichael Schreck J 05/30/2015,10:13 AM

## 2015-05-30 NOTE — Progress Notes (Signed)
PROGRESS NOTE  Eduardo Herman FFM:[id number] DOB: [birth year] DOA: 05/27/2015 PCP: No primary care provider on file.  HPI/Recap of past 24 hours: No complaints, no family at bedside  Assessment/Plan:  Severe Right lower extremity cellulitis: -venous Duplex negative for DVT.  -Continue vanc/zosyn. -has chronic venous stasis changes and previously followed at the wound center Father reported patient has been treated for this for the past several weeks at wound center. -will need several more days of IV Abx  Sepsis with leukocytosis/fever/celullitis: ivf/abx -improving  Mild AKi vs CKD - likely due to sepsis. Hydrate, stable, Renal dosing meds.  H/o HTN: home meds atenolol/cardizem/lasix were held due to sepsis.  -Resumed Po lasxi, atenolol and cardizem  Hypokalemia -replace  Mental retardation  Borderline DM -SSI  DVT proph: lovenox  Code Status: full Family Communication: none at bedside Disposition Plan: home when medically stable   Consultants:  none  Procedures:  none  Antibiotics:  vanc/zosyn   Objective: BP 140/74 mmHg  Pulse 71  Temp(Src) 98 F (36.7 C) (Oral)  Resp 17  Ht 6' (1.829 m)  Wt 127.869 kg (281 lb 14.4 oz)  BMI 38.22 kg/m2  SpO2 99%  Intake/Output Summary (Last 24 hours) at 05/30/15 1356 Last data filed at 05/30/15 0536  Gross per 24 hour  Intake 1987.5 ml  Output      0 ml  Net 1987.5 ml   Filed Weights   05/27/15 2143  Weight: 127.869 kg (281 lb 14.4 oz)    Exam:   General:  NAD  Cardiovascular: RRR  Respiratory: CTABL  Abdomen: Soft/ND/NT, positive BS  Musculoskeletal: right lower extremity erythematous/edematous/ superficial ulcerations/blisters, large blister superiorly which opened, sero-purulent discharge  Neuro: baseline mental retardation  Data Reviewed: Basic Metabolic Panel:  Recent Labs Lab 05/27/15 1843 05/28/15 0425 05/29/15 0344 05/30/15 0336  NA 138 140 140 139  K 3.0* 3.4* 3.2* 3.4*    CL 105 109 111 107  CO2 26 23 22 24   GLUCOSE 161* 139* 110* 100*  BUN 13 12 9 13   CREATININE 1.27* 1.32* 1.30* 1.43*  CALCIUM 8.5* 8.1* 7.7* 8.1*  MG  --  1.8  --   --    Liver Function Tests:  Recent Labs Lab 05/27/15 1843 05/28/15 0425 05/29/15 0344 05/30/15 0336  AST 16 16 15 15   ALT 13* 11* 10* 11*  ALKPHOS 96 86 88 106  BILITOT 1.3* 2.1* 1.8* 1.5*  PROT 7.6 6.6 5.9* 6.2*  ALBUMIN 3.1* 2.7* 2.2* 2.2*   No results for input(s): LIPASE, AMYLASE in the last 168 hours. No results for input(s): AMMONIA in the last 168 hours. CBC:  Recent Labs Lab 05/27/15 1843 05/28/15 0425 05/29/15 0344 05/30/15 0336  WBC 17.5* 16.3* 12.3* 13.5*  NEUTROABS 14.5*  --   --   --   HGB 16.5 15.1 14.0 14.6  HCT 49.6 46.2 42.8 45.3  MCV 86.9 87.7 87.9 88.1  PLT 183 190 177 202   Cardiac Enzymes:   No results for input(s): CKTOTAL, CKMB, CKMBINDEX, TROPONINI in the last 168 hours. BNP (last 3 results) No results for input(s): BNP in the last 8760 hours.  ProBNP (last 3 results) No results for input(s): PROBNP in the last 8760 hours.  CBG: No results for input(s): GLUCAP in the last 168 hours.  No results found for this or any previous visit (from the past 240 hour(s)).   Studies: No results found.  Scheduled Meds: . aspirin EC  81 mg Oral Daily  .  atenolol  100 mg Oral Daily  . diltiazem  360 mg Oral Daily  . enoxaparin (LOVENOX) injection  40 mg Subcutaneous Q24H  . furosemide  80 mg Oral Daily  . piperacillin-tazobactam (ZOSYN)  IV  3.375 g Intravenous Q8H  . vancomycin  1,000 mg Intravenous Q12H    Continuous Infusions:     Time spent: 31mins  Qamar Aughenbaugh MD  [facility] Pager [phone]. If 7PM-7AM, please contact night-coverage at [email or web address], password [facility] 05/30/2015, 1:56 PM  LOS: 3 days

## 2015-05-31 LAB — CBC
HCT: 40.4 % (ref 39.0–52.0)
Hemoglobin: 13.2 g/dL (ref 13.0–17.0)
MCH: 28.4 pg (ref 26.0–34.0)
MCHC: 32.7 g/dL (ref 30.0–36.0)
MCV: 86.9 fL (ref 78.0–100.0)
PLATELETS: 218 10*3/uL (ref 150–400)
RBC: 4.65 MIL/uL (ref 4.22–5.81)
RDW: 16.4 % — ABNORMAL HIGH (ref 11.5–15.5)
WBC: 12.6 10*3/uL — ABNORMAL HIGH (ref 4.0–10.5)

## 2015-05-31 LAB — BASIC METABOLIC PANEL
ANION GAP: 6 (ref 5–15)
BUN: 14 mg/dL (ref 6–20)
CALCIUM: 7.8 mg/dL — AB (ref 8.9–10.3)
CO2: 22 mmol/L (ref 22–32)
Chloride: 111 mmol/L (ref 101–111)
Creatinine, Ser: 1.78 mg/dL — ABNORMAL HIGH (ref 0.61–1.24)
GFR, EST AFRICAN AMERICAN: 44 mL/min — AB (ref >60)
GFR, EST NON AFRICAN AMERICAN: 38 mL/min — AB (ref >60)
Glucose, Bld: 102 mg/dL — ABNORMAL HIGH (ref 65–99)
Potassium: 3.1 mmol/L — ABNORMAL LOW (ref 3.5–5.1)
SODIUM: 139 mmol/L (ref 135–145)

## 2015-05-31 MED ORDER — POTASSIUM CHLORIDE CRYS ER 20 MEQ PO TBCR
40.0000 meq | EXTENDED_RELEASE_TABLET | Freq: Two times a day (BID) | ORAL | Status: DC
  Administered 2015-05-31 – 2015-06-10 (×21): 40 meq via ORAL
  Filled 2015-05-31 (×15): qty 2
  Filled 2015-05-31: qty 4
  Filled 2015-05-31 (×8): qty 2

## 2015-05-31 MED ORDER — CLINDAMYCIN PHOSPHATE 600 MG/50ML IV SOLN
600.0000 mg | Freq: Three times a day (TID) | INTRAVENOUS | Status: DC
  Administered 2015-05-31 – 2015-06-06 (×18): 600 mg via INTRAVENOUS
  Filled 2015-05-31 (×20): qty 50

## 2015-05-31 NOTE — Progress Notes (Signed)
ANTIBIOTIC CONSULT NOTE - FOLLOW UP  Pharmacy Consult for Vancomycin and Zosyn Indication: RLE cellulitis  Allergies  Allergen Reactions  . Lisinopril Swelling    Angioedema of the tongue    Patient Measurements: Height: 6' (182.9 cm) Weight: 281 lb 14.4 oz (127.869 kg) IBW/kg (Calculated) : 77.6  Vital Signs: Temp: 98.2 F (36.8 C) (07/30 0535) Temp Source: Oral (07/30 0535) BP: 142/79 mmHg (07/30 0535) Pulse Rate: 61 (07/30 0535) Intake/Output from previous day: 07/29 0701 - 07/30 0700 In: 360 [P.O.:360] Out: -  Intake/Output from this shift: Total I/O In: 240 [P.O.:240] Out: -   Labs:  Recent Labs  05/29/15 0344 05/30/15 0336 05/31/15 0311  WBC 12.3* 13.5* 12.6*  HGB 14.0 14.6 13.2  PLT 177 202 218  CREATININE 1.30* 1.43* 1.78*   Estimated Creatinine Clearance: 57.2 mL/min (by C-G formula based on Cr of 1.78). No results for input(s): VANCOTROUGH, VANCOPEAK, VANCORANDOM, GENTTROUGH, GENTPEAK, GENTRANDOM, TOBRATROUGH, TOBRAPEAK, TOBRARND, AMIKACINPEAK, AMIKACINTROU, AMIKACIN in the last 72 hours.   Microbiology: No results found for this or any previous visit (from the past 720 hour(s)).  Anti-infectives    Start     Dose/Rate Route Frequency Ordered Stop   05/31/15 2200  clindamycin (CLEOCIN) IVPB 600 mg     600 mg 100 mL/hr over 30 Minutes Intravenous 3 times per day 05/31/15 1133     05/28/15 1100  vancomycin (VANCOCIN) IVPB 1000 mg/200 mL premix  Status:  Discontinued     1,000 mg 200 mL/hr over 60 Minutes Intravenous Every 12 hours 05/27/15 2222 05/31/15 1132   05/28/15 1000  vancomycin (VANCOCIN) 1,500 mg in sodium chloride 0.9 % 500 mL IVPB  Status:  Discontinued     1,500 mg 250 mL/hr over 120 Minutes Intravenous Every 12 hours 05/28/15 0400 05/28/15 0406   05/28/15 0100  piperacillin-tazobactam (ZOSYN) IVPB 3.375 g     3.375 g 12.5 mL/hr over 240 Minutes Intravenous Every 8 hours 05/27/15 2222     05/27/15 2130  vancomycin (VANCOCIN) 1,500 mg  in sodium chloride 0.9 % 500 mL IVPB     1,500 mg 250 mL/hr over 120 Minutes Intravenous  Once 05/27/15 2120 05/28/15 0138   05/27/15 1945  vancomycin (VANCOCIN) IVPB 1000 mg/200 mL premix     1,000 mg 200 mL/hr over 60 Minutes Intravenous  Once 05/27/15 1935 05/27/15 2154   05/27/15 1945  piperacillin-tazobactam (ZOSYN) IVPB 3.375 g     3.375 g 100 mL/hr over 30 Minutes Intravenous  Once 05/27/15 1935 05/27/15 2036      Assessment: 64 yo male with R leg cellulitis to begin vancomycin and zosyn per pharmacy. Now D#5 for RLE cellulitis,  Scr continues to rise.  Afebrile, wbc stable at 12.6   Plan:  Change vancomycin to clindamycin 600mg  IV q8h Continue Zosyn 3.375gm IV Q8H, 4 hr infusion Monitor clinical picture, renal function,  F/U C&S, abx deescalation / LOT  Candie Mile 05/31/2015,11:35 AM
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
## 2015-05-31 NOTE — Progress Notes (Signed)
PROGRESS NOTE  Eduardo Herman WIO:[id number] DOB: January 29, 1951 DOA: 05/27/2015 PCP: No primary care provider on file.  HPI/Recap of past 24 hours: No complaints, no family at bedside  Assessment/Plan:  Severe Right lower extremity cellulitis: -venous Duplex negative for DVT.  -Continue vanc/zosyn. -has chronic venous stasis changes and previously followed at the wound center Father reported patient has been treated for this for the past several weeks at wound center. -will need several more days of IV Abx -continue current regimen and Po lasix, leg elevation  Sepsis with leukocytosis/fever/celullitis: ivf/abx -improving  Mild AKi vs CKD - likely due to sepsis. Hydrate, stable, Renal dosing meds.  H/o HTN: home meds atenolol/cardizem/lasix were held due to sepsis.  -Resumed Po lasix, atenolol and cardizem  Hypokalemia -replaced  Mental retardation  Borderline DM -SSI  DVT proph: lovenox  Code Status: full Family Communication: none at bedside, will attempt tp reach father Disposition Plan: home when medically stable   Consultants:  none  Procedures:  none  Antibiotics:  vanc/zosyn   Objective: BP 142/79 mmHg  Pulse 61  Temp(Src) 98.2 F (36.8 C) (Oral)  Resp 18  Ht 6' (1.829 m)  Wt 127.869 kg (281 lb 14.4 oz)  BMI 38.22 kg/m2  SpO2 96%  Intake/Output Summary (Last 24 hours) at 05/31/15 1153 Last data filed at 05/31/15 0900  Gross per 24 hour  Intake    600 ml  Output      0 ml  Net    600 ml   Filed Weights   05/27/15 2143  Weight: 127.869 kg (281 lb 14.4 oz)    Exam:   General:  NAD  Cardiovascular: RRR  Respiratory: CTABL  Abdomen: Soft/ND/NT, positive BS  Musculoskeletal: right lower extremity erythematous/edematous/ superficial ulcerations/blisters, large blister superiorly which opened, sero-purulent discharge  Neuro: baseline mental retardation  Data Reviewed: Basic Metabolic Panel:  Recent Labs Lab 05/27/15 1843  05/28/15 0425 05/29/15 0344 05/30/15 0336 05/31/15 0311  NA 138 140 140 139 139  K 3.0* 3.4* 3.2* 3.4* 3.1*  CL 105 109 111 107 111  CO2 26 23 22 24 22   GLUCOSE 161* 139* 110* 100* 102*  BUN 13 12 9 13 14   CREATININE 1.27* 1.32* 1.30* 1.43* 1.78*  CALCIUM 8.5* 8.1* 7.7* 8.1* 7.8*  MG  --  1.8  --   --   --    Liver Function Tests:  Recent Labs Lab 05/27/15 1843 05/28/15 0425 05/29/15 0344 05/30/15 0336  AST 16 16 15 15   ALT 13* 11* 10* 11*  ALKPHOS 96 86 88 106  BILITOT 1.3* 2.1* 1.8* 1.5*  PROT 7.6 6.6 5.9* 6.2*  ALBUMIN 3.1* 2.7* 2.2* 2.2*   No results for input(s): LIPASE, AMYLASE in the last 168 hours. No results for input(s): AMMONIA in the last 168 hours. CBC:  Recent Labs Lab 05/27/15 1843 05/28/15 0425 05/29/15 0344 05/30/15 0336 05/31/15 0311  WBC 17.5* 16.3* 12.3* 13.5* 12.6*  NEUTROABS 14.5*  --   --   --   --   HGB 16.5 15.1 14.0 14.6 13.2  HCT 49.6 46.2 42.8 45.3 40.4  MCV 86.9 87.7 87.9 88.1 86.9  PLT 183 190 177 202 218   Cardiac Enzymes:   No results for input(s): CKTOTAL, CKMB, CKMBINDEX, TROPONINI in the last 168 hours. BNP (last 3 results) No results for input(s): BNP in the last 8760 hours.  ProBNP (last 3 results) No results for input(s): PROBNP in the last 8760 hours.  CBG: No results  for input(s): GLUCAP in the last 168 hours.  No results found for this or any previous visit (from the past 240 hour(s)).   Studies: No results found.  Scheduled Meds: . aspirin EC  81 mg Oral Daily  . atenolol  100 mg Oral Daily  . clindamycin (CLEOCIN) IV  600 mg Intravenous 3 times per day  . diltiazem  360 mg Oral Daily  . enoxaparin (LOVENOX) injection  40 mg Subcutaneous Q24H  . furosemide  80 mg Oral Daily  . piperacillin-tazobactam (ZOSYN)  IV  3.375 g Intravenous Q8H  . potassium chloride  40 mEq Oral BID    Continuous Infusions:     Time spent: 27mins  Jazmen Lindenbaum MD  [facility] Pager [phone]. If 7PM-7AM,  please contact night-coverage at [email or web address], password [facility] 05/31/2015, 11:53 AM  LOS: 4 days

## 2015-06-01 LAB — BASIC METABOLIC PANEL
Anion gap: 9 (ref 5–15)
BUN: 17 mg/dL (ref 6–20)
CO2: 20 mmol/L — ABNORMAL LOW (ref 22–32)
Calcium: 7.7 mg/dL — ABNORMAL LOW (ref 8.9–10.3)
Chloride: 111 mmol/L (ref 101–111)
Creatinine, Ser: 1.86 mg/dL — ABNORMAL HIGH (ref 0.61–1.24)
GFR calc Af Amer: 42 mL/min — ABNORMAL LOW (ref >60)
GFR calc non Af Amer: 36 mL/min — ABNORMAL LOW (ref >60)
GLUCOSE: 90 mg/dL (ref 65–99)
POTASSIUM: 3.3 mmol/L — AB (ref 3.5–5.1)
Sodium: 140 mmol/L (ref 135–145)

## 2015-06-01 LAB — CBC
HEMATOCRIT: 41.8 % (ref 39.0–52.0)
Hemoglobin: 13.8 g/dL (ref 13.0–17.0)
MCH: 28.7 pg (ref 26.0–34.0)
MCHC: 33 g/dL (ref 30.0–36.0)
MCV: 86.9 fL (ref 78.0–100.0)
Platelets: 229 10*3/uL (ref 150–400)
RBC: 4.81 MIL/uL (ref 4.22–5.81)
RDW: 16.3 % — AB (ref 11.5–15.5)
WBC: 12.6 10*3/uL — ABNORMAL HIGH (ref 4.0–10.5)

## 2015-06-01 NOTE — Progress Notes (Addendum)
PROGRESS NOTE  Eduardo Herman DTO:[id number] DOB: 1951-09-20 DOA: 05/27/2015 PCP: No primary care provider on file.  HPI/Recap of past 24 hours: No complaints, no family at bedside  Assessment/Plan:  Severe Right lower extremity cellulitis: -venous Duplex negative for DVT.  -Has been on vanc/zosyn, Vanc changed to IV clinda due to AKI yesterday -has chronic venous stasis changes and previously followed at the wound center Father reported patient has been treated for this for the past several weeks at wound center. -will need several more days of IV Abx -continue current regimen and Po lasix, leg elevation -slow to improve, [facility] consult  Sepsis with leukocytosis/fever/celullitis: ivf/abx -improving  Mild AKi vs CKD - likely due to sepsis, stable, Renal dosing meds. -now back on low dose lasix  H/o HTN: home meds atenolol/cardizem/lasix were held due to sepsis.  -Resumed Po lasix, atenolol and cardizem  Hypokalemia -replaced  Mental retardation  Borderline DM -SSI  DVT proph: lovenox  Code Status: full Family Communication: none at bedside, will attempt to reach father Disposition Plan: home when medically stable   Consultants:  none  Procedures:  none  Antibiotics:  vanc/zosyn   Objective: BP 162/79 mmHg  Pulse 62  Temp(Src) 98.5 F (36.9 C) (Oral)  Resp 20  Ht 6' (1.829 m)  Wt 127.869 kg (281 lb 14.4 oz)  BMI 38.22 kg/m2  SpO2 98%  Intake/Output Summary (Last 24 hours) at 06/01/15 1345 Last data filed at 06/01/15 0900  Gross per 24 hour  Intake   1040 ml  Output      0 ml  Net   1040 ml   Filed Weights   05/27/15 2143  Weight: 127.869 kg (281 lb 14.4 oz)    Exam:   General:  NAD  Cardiovascular: RRR  Respiratory: CTABL  Abdomen: Soft/ND/NT, positive BS  Musculoskeletal: right lower extremity erythematous/edematous/ superficial ulcerations/blisters, large blister superiorly which opened, sero-purulent discharge  Neuro: baseline  mental retardation  Data Reviewed: Basic Metabolic Panel:  Recent Labs Lab 05/28/15 0425 05/29/15 0344 05/30/15 0336 05/31/15 0311 06/01/15 0411  NA 140 140 139 139 140  K 3.4* 3.2* 3.4* 3.1* 3.3*  CL 109 111 107 111 111  CO2 23 22 24 22  20*  GLUCOSE 139* 110* 100* 102* 90  BUN 12 9 13 14 17   CREATININE 1.32* 1.30* 1.43* 1.78* 1.86*  CALCIUM 8.1* 7.7* 8.1* 7.8* 7.7*  MG 1.8  --   --   --   --    Liver Function Tests:  Recent Labs Lab 05/27/15 1843 05/28/15 0425 05/29/15 0344 05/30/15 0336  AST 16 16 15 15   ALT 13* 11* 10* 11*  ALKPHOS 96 86 88 106  BILITOT 1.3* 2.1* 1.8* 1.5*  PROT 7.6 6.6 5.9* 6.2*  ALBUMIN 3.1* 2.7* 2.2* 2.2*   No results for input(s): LIPASE, AMYLASE in the last 168 hours. No results for input(s): AMMONIA in the last 168 hours. CBC:  Recent Labs Lab 05/27/15 1843 05/28/15 0425 05/29/15 0344 05/30/15 0336 05/31/15 0311 06/01/15 0411  WBC 17.5* 16.3* 12.3* 13.5* 12.6* 12.6*  NEUTROABS 14.5*  --   --   --   --   --   HGB 16.5 15.1 14.0 14.6 13.2 13.8  HCT 49.6 46.2 42.8 45.3 40.4 41.8  MCV 86.9 87.7 87.9 88.1 86.9 86.9  PLT 183 190 177 202 218 229   Cardiac Enzymes:   No results for input(s): CKTOTAL, CKMB, CKMBINDEX, TROPONINI in the last 168 hours. BNP (last 3 results)  No results for input(s): BNP in the last 8760 hours.  ProBNP (last 3 results) No results for input(s): PROBNP in the last 8760 hours.  CBG: No results for input(s): GLUCAP in the last 168 hours.  No results found for this or any previous visit (from the past 240 hour(s)).   Studies: No results found.  Scheduled Meds: . aspirin EC  81 mg Oral Daily  . atenolol  100 mg Oral Daily  . clindamycin (CLEOCIN) IV  600 mg Intravenous 3 times per day  . diltiazem  360 mg Oral Daily  . enoxaparin (LOVENOX) injection  40 mg Subcutaneous Q24H  . furosemide  80 mg Oral Daily  . piperacillin-tazobactam (ZOSYN)  IV  3.375 g Intravenous Q8H  . potassium chloride  40 mEq  Oral BID    Continuous Infusions:     Time spent: 33mins  Esmirna Ravan MD  [facility] Pager [phone]. If 7PM-7AM, please contact night-coverage at [email or web address], password [facility] 06/01/2015, 1:45 PM  LOS: 5 days

## 2015-06-02 LAB — CBC
HCT: 42.1 % (ref 39.0–52.0)
Hemoglobin: 13.9 g/dL (ref 13.0–17.0)
MCH: 28.3 pg (ref 26.0–34.0)
MCHC: 33 g/dL (ref 30.0–36.0)
MCV: 85.7 fL (ref 78.0–100.0)
PLATELETS: 268 10*3/uL (ref 150–400)
RBC: 4.91 MIL/uL (ref 4.22–5.81)
RDW: 16.2 % — AB (ref 11.5–15.5)
WBC: 11.2 10*3/uL — ABNORMAL HIGH (ref 4.0–10.5)

## 2015-06-02 LAB — BASIC METABOLIC PANEL
ANION GAP: 9 (ref 5–15)
BUN: 17 mg/dL (ref 6–20)
CHLORIDE: 107 mmol/L (ref 101–111)
CO2: 26 mmol/L (ref 22–32)
CREATININE: 1.94 mg/dL — AB (ref 0.61–1.24)
Calcium: 7.9 mg/dL — ABNORMAL LOW (ref 8.9–10.3)
GFR calc Af Amer: 40 mL/min — ABNORMAL LOW (ref >60)
GFR, EST NON AFRICAN AMERICAN: 35 mL/min — AB (ref >60)
GLUCOSE: 97 mg/dL (ref 65–99)
Potassium: 3.2 mmol/L — ABNORMAL LOW (ref 3.5–5.1)
SODIUM: 142 mmol/L (ref 135–145)

## 2015-06-02 NOTE — Consult Note (Addendum)
WOC wound consult note Reason for Consult: Consult requested for right leg wound; this began as a bulla according to the EMR. Wound type: Full thickness to just below right knee Measurement:2X3X3cm Wound bed: 90% red, 10% yellow, loose peeling skin surrounding where previous blister has ruptured.   Drainage (amount, consistency, odor) Wound bed is fluctuant, swab inserted deeply to 3 cm when probed and drained large amt thick tan pus, no odor. Periwound: Erythremia surrounding to 2 cm. Dressing procedure/placement/frequency: Recommend consult to ortho service for possible I&D.  Discussed plan of care with primary team, they plan to get CT scan to assess for abscess and possible bone involvement.  4X4 and ABD pads to absorb drainage until further input is available.   Please re-consult if further assistance is needed.  Thank-you,  Julien Girt MSN, [place], [place], [place], [place]

## 2015-06-02 NOTE — Care Management Important Message (Signed)
Important Message  Patient Details  Name: Eduardo Herman MRN: [id number] Date of Birth: July 13, 1951   Medicare Important Message Given:  Yes-third notification given    Delorse Lek 06/02/2015, 12:06 PM

## 2015-06-02 NOTE — Progress Notes (Signed)
PROGRESS NOTE  Eduardo Herman IDP:[id number] DOB: 07-07-1951 DOA: 05/27/2015 PCP: No primary care provider on file.  HPI/Recap of past 24 hours: Feels well, denies any complaints  Assessment/Plan:  Severe Right lower extremity cellulitis: -venous Duplex negative for DVT.  -Has been on vanc/zosyn, Vanc changed to IV clinda due to AKI yesterday -has chronic venous stasis changes and previously followed at the wound center Father reported patient has been treated for this for the past several weeks at wound center. -will need several more days of IV Abx -continue current regimen and Po lasix, leg elevation -clinically improving, but today with large amount of purulent discharge when bullae probed by [facility] RN, will get MRI to r/o abscess  Sepsis with leukocytosis/fever/celullitis: ivf/abx -improving  Mild AKi vs CKD - likely due to sepsis, stable, Renal dosing meds. -now back on low dose lasix  H/o HTN: home meds atenolol/cardizem/lasix were held due to sepsis.  -Resumed Po lasix, atenolol and cardizem  Hypokalemia -replaced  Mental retardation  Borderline DM -SSI  DVT proph: lovenox  Code Status: full Family Communication: none at bedside,updated father Disposition Plan: home when medically stable   Consultants:  none  Procedures:  none  Antibiotics:  vanc/zosyn   Objective: BP 141/85 mmHg  Pulse 57  Temp(Src) 97.7 F (36.5 C) (Oral)  Resp 19  Ht 6' (1.829 m)  Wt 127.869 kg (281 lb 14.4 oz)  BMI 38.22 kg/m2  SpO2 97%  Intake/Output Summary (Last 24 hours) at 06/02/15 1409 Last data filed at 06/01/15 1828  Gross per 24 hour  Intake    120 ml  Output      0 ml  Net    120 ml   Filed Weights   05/27/15 2143  Weight: 127.869 kg (281 lb 14.4 oz)    Exam:   General:  NAD  Cardiovascular: RRR  Respiratory: CTABL  Abdomen: Soft/ND/NT, positive BS  Musculoskeletal: right lower extremity erythematous/edematous/ superficial  ulcerations/blisters, large blister superiorly which opened, sero-purulent discharge, improving  Neuro: baseline mental retardation  Data Reviewed: Basic Metabolic Panel:  Recent Labs Lab 05/28/15 0425 05/29/15 0344 05/30/15 0336 05/31/15 0311 06/01/15 0411 06/02/15 0645  NA 140 140 139 139 140 142  K 3.4* 3.2* 3.4* 3.1* 3.3* 3.2*  CL 109 111 107 111 111 107  CO2 23 22 24 22  20* 26  GLUCOSE 139* 110* 100* 102* 90 97  BUN 12 9 13 14 17 17   CREATININE 1.32* 1.30* 1.43* 1.78* 1.86* 1.94*  CALCIUM 8.1* 7.7* 8.1* 7.8* 7.7* 7.9*  MG 1.8  --   --   --   --   --    Liver Function Tests:  Recent Labs Lab 05/27/15 1843 05/28/15 0425 05/29/15 0344 05/30/15 0336  AST 16 16 15 15   ALT 13* 11* 10* 11*  ALKPHOS 96 86 88 106  BILITOT 1.3* 2.1* 1.8* 1.5*  PROT 7.6 6.6 5.9* 6.2*  ALBUMIN 3.1* 2.7* 2.2* 2.2*   No results for input(s): LIPASE, AMYLASE in the last 168 hours. No results for input(s): AMMONIA in the last 168 hours. CBC:  Recent Labs Lab 05/27/15 1843  05/29/15 0344 05/30/15 0336 05/31/15 0311 06/01/15 0411 06/02/15 0645  WBC 17.5*  < > 12.3* 13.5* 12.6* 12.6* 11.2*  NEUTROABS 14.5*  --   --   --   --   --   --   HGB 16.5  < > 14.0 14.6 13.2 13.8 13.9  HCT 49.6  < > 42.8 45.3 40.4  41.8 42.1  MCV 86.9  < > 87.9 88.1 86.9 86.9 85.7  PLT 183  < > 177 202 218 229 268  < > = values in this interval not displayed. Cardiac Enzymes:   No results for input(s): CKTOTAL, CKMB, CKMBINDEX, TROPONINI in the last 168 hours. BNP (last 3 results) No results for input(s): BNP in the last 8760 hours.  ProBNP (last 3 results) No results for input(s): PROBNP in the last 8760 hours.  CBG: No results for input(s): GLUCAP in the last 168 hours.  No results found for this or any previous visit (from the past 240 hour(s)).   Studies: No results found.  Scheduled Meds: . aspirin EC  81 mg Oral Daily  . atenolol  100 mg Oral Daily  . clindamycin (CLEOCIN) IV  600 mg  Intravenous 3 times per day  . diltiazem  360 mg Oral Daily  . enoxaparin (LOVENOX) injection  40 mg Subcutaneous Q24H  . furosemide  80 mg Oral Daily  . piperacillin-tazobactam (ZOSYN)  IV  3.375 g Intravenous Q8H  . potassium chloride  40 mEq Oral BID    Continuous Infusions:     Time spent: 79mins  Nadeen Shipman MD  [facility] Pager [phone]. If 7PM-7AM, please contact night-coverage at [email or web address], password [facility] 06/02/2015, 2:09 PM  LOS: 6 days

## 2015-06-02 NOTE — Evaluation (Signed)
Physical Therapy Evaluation Patient Details Name: Eduardo Herman MRN: [id number] DOB: September 19, 1951 Today's Date: 06/02/2015   History of Present Illness  64 yo male with CP and MR was admitted for drainage RLE wound with I&D consult, referred to PT to restore his mobility as able.  Clinical Impression  Pt was able to walk in his room with IV pole and RW, and PT recommended to nursing to use 2 person assist due to pt's cognition.  He is not continent and might also need extra help to manage his clothing and use of commode with 2 person nursing assistance.  Family was not available to ask about the use of SNF for follow up care.    Follow Up Recommendations SNF    Equipment Recommendations  Rolling walker with 5" wheels;Other (comment) (if pt doesn't have one)    Recommendations for Other Services       Precautions / Restrictions Precautions Precautions: Fall      Mobility  Bed Mobility Overal bed mobility: Needs Assistance Bed Mobility: Supine to Sit     Supine to sit: Min assist     General bed mobility comments: assist mainly to finish scooting out with trunk support  Transfers Overall transfer level: Needs assistance Equipment used: Rolling walker (2 wheeled);1 person hand held assist Transfers: Sit to/from [facility] Sit to Stand: Min assist Stand pivot transfers: Min assist       General transfer comment: unsteady upon standing at times  Ambulation/Gait Ambulation/Gait assistance: Min assist;+2 physical assistance;+2 safety/equipment Ambulation Distance (Feet): 25 Feet Assistive device: Rolling walker (2 wheeled);1 person hand held assist Gait Pattern/deviations: Step-through pattern;Wide base of support;Drifts right/left;Decreased weight shift to right;Decreased stance time - right Gait velocity: reduced Gait velocity interpretation: Below normal speed for age/gender    Stairs            Wheelchair Mobility    Modified Rankin (Stroke  Patients Only)       Balance Overall balance assessment: Needs assistance Sitting-balance support: Feet supported Sitting balance-Leahy Scale: Good   Postural control: Posterior lean Standing balance support: Bilateral upper extremity supported Standing balance-Leahy Scale: Fair Standing balance comment: fair- for any shifting off BOS in standing                             Pertinent Vitals/Pain Pain Assessment: Faces Pain Score: 6  Faces Pain Scale: Hurts even more Pain Location: R knee, leg Pain Intervention(s): Limited activity within patient's tolerance;Monitored during session    [place] expects to be discharged to:: Private residence Living Arrangements: Parent Available Help at Discharge: Family Type of Home: House Home Access: Stairs to enter;Ramped entrance   Entrance Stairs-Number of Steps: 3 Home Layout: One level Home Equipment: Other (comment) (Pt is not able to answer questions about equipment)      Prior Function Level of Independence: Independent               Hand Dominance        Extremity/Trunk Assessment   Upper Extremity Assessment: Overall WFL for tasks assessed           Lower Extremity Assessment: Generalized weakness      Cervical / Trunk Assessment: Normal  Communication   Communication: No difficulties  Cognition Arousal/Alertness: Awake/alert Behavior During Therapy: Restless Overall Cognitive Status: History of cognitive impairments - at baseline       Memory: Decreased recall of precautions;Decreased short-term memory  General Comments General comments (skin integrity, edema, etc.): Pt is following instructions and incontinent with bowels and bladder during the RLE evaluation    Exercises        Assessment/Plan    PT Assessment Patient needs continued PT services  PT Diagnosis Difficulty walking   PT Problem List Decreased strength;Decreased range of  motion;Decreased activity tolerance;Decreased balance;Decreased mobility;Decreased coordination;Decreased cognition;Decreased knowledge of use of DME;Decreased safety awareness;Decreased skin integrity;Pain  PT Treatment Interventions DME instruction;Gait training;Stair training;Functional mobility training;Therapeutic activities;Therapeutic exercise;Balance training;Neuromuscular re-education;Cognitive remediation;Patient/family education   PT Goals (Current goals can be found in the Care Plan section) Acute Rehab PT Goals Patient Stated Goal: to get up to use [facility] PT Goal Formulation: With patient Time For Goal Achievement: 06/16/15 Potential to Achieve Goals: Good    Frequency Min 2X/week   Barriers to discharge Inaccessible home environment      Co-evaluation               End of Session Equipment Utilized During Treatment: Gait belt Activity Tolerance: Patient tolerated treatment well Patient left: in chair;with call bell/phone within reach;with nursing/sitter in room Nurse Communication: Mobility status         Time: 5883-2549 PT Time Calculation (min) (ACUTE ONLY): 28 min   Charges:   PT Evaluation $Initial PT Evaluation Tier I: 1 Procedure PT Treatments $Gait Training: 8-22 mins   PT G CodesRamond Dial 2015/06/06, 3:22 PM   Mee Hives, PT MS Acute Rehab Dept. Number: [phone] [phone] and [place] [phone]

## 2015-06-02 NOTE — Clinical Social Work Note (Signed)
CSW spoke to patient to discuss SNF placement, patient asked for CSW to contact his father to talk to him about SNF placement.  CSW did not have time to contact patient's father, will contact tomorrow morning.  Jones Broom. [place], MSW, [place] 06/02/2015 5:04 PM

## 2015-06-03 ENCOUNTER — Inpatient Hospital Stay (HOSPITAL_COMMUNITY): Payer: Medicare Other

## 2015-06-03 LAB — BASIC METABOLIC PANEL
Anion gap: 9 (ref 5–15)
BUN: 17 mg/dL (ref 6–20)
CHLORIDE: 106 mmol/L (ref 101–111)
CO2: 25 mmol/L (ref 22–32)
Calcium: 7.7 mg/dL — ABNORMAL LOW (ref 8.9–10.3)
Creatinine, Ser: 1.92 mg/dL — ABNORMAL HIGH (ref 0.61–1.24)
GFR calc non Af Amer: 35 mL/min — ABNORMAL LOW (ref >60)
GFR, EST AFRICAN AMERICAN: 41 mL/min — AB (ref >60)
Glucose, Bld: 116 mg/dL — ABNORMAL HIGH (ref 65–99)
Potassium: 3.4 mmol/L — ABNORMAL LOW (ref 3.5–5.1)
SODIUM: 140 mmol/L (ref 135–145)

## 2015-06-03 LAB — CREATININE, SERUM
Creatinine, Ser: 1.96 mg/dL — ABNORMAL HIGH (ref 0.61–1.24)
GFR calc Af Amer: 40 mL/min — ABNORMAL LOW (ref >60)
GFR calc non Af Amer: 34 mL/min — ABNORMAL LOW (ref >60)

## 2015-06-03 MED ORDER — LORAZEPAM 2 MG/ML IJ SOLN
INTRAMUSCULAR | Status: AC
  Filled 2015-06-03: qty 1

## 2015-06-03 MED ORDER — LEVOTHYROXINE SODIUM 100 MCG PO TABS
200.0000 ug | ORAL_TABLET | ORAL | Status: DC
  Administered 2015-06-04 – 2015-06-09 (×3): 200 ug via ORAL
  Filled 2015-06-03 (×4): qty 2

## 2015-06-03 MED ORDER — LORAZEPAM 2 MG/ML IJ SOLN
2.0000 mg | INTRAMUSCULAR | Status: AC | PRN
  Administered 2015-06-03: 2 mg via INTRAVENOUS

## 2015-06-03 MED ORDER — LEVOTHYROXINE SODIUM 100 MCG PO TABS
300.0000 ug | ORAL_TABLET | ORAL | Status: DC
  Administered 2015-06-03 – 2015-06-10 (×5): 300 ug via ORAL
  Filled 2015-06-03 (×4): qty 3

## 2015-06-03 NOTE — Progress Notes (Addendum)
ANTIBIOTIC CONSULT NOTE - FOLLOW UP  Pharmacy Consult for Clindamycin/Zosyn Indication: RLE Cellulitis  Allergies  Allergen Reactions  . Lisinopril Swelling    Angioedema of the tongue    Patient Measurements: Height: 6' (182.9 cm) Weight: 281 lb 14.4 oz (127.869 kg) IBW/kg (Calculated) : 77.6   Vital Signs: Temp: 97.5 F (36.4 C) (08/02 0621) Temp Source: Axillary (08/02 0621) BP: 147/75 mmHg (08/02 0621) Pulse Rate: 57 (08/02 0621) Intake/Output from previous day: 08/01 0701 - 08/02 0700 In: 188 [P.O.:720; IV Piggyback:150] Out: -  Intake/Output from this shift:    Labs:  Recent Labs  06/01/15 0411 06/02/15 0645 06/03/15 0014 06/03/15 0326  WBC 12.6* 11.2*  --   --   HGB 13.8 13.9  --   --   PLT 229 268  --   --   CREATININE 1.86* 1.94* 1.92* 1.96*   Estimated Creatinine Clearance: 51.9 mL/min (by C-G formula based on Cr of 1.96). No results for input(s): VANCOTROUGH, VANCOPEAK, VANCORANDOM, GENTTROUGH, GENTPEAK, GENTRANDOM, TOBRATROUGH, TOBRAPEAK, TOBRARND, AMIKACINPEAK, AMIKACINTROU, AMIKACIN in the last 72 hours.   Microbiology: No results found for this or any previous visit (from the past 720 hour(s)).  Anti-infectives    Start     Dose/Rate Route Frequency Ordered Stop   05/31/15 2200  clindamycin (CLEOCIN) IVPB 600 mg     600 mg 100 mL/hr over 30 Minutes Intravenous 3 times per day 05/31/15 1133     05/28/15 1100  vancomycin (VANCOCIN) IVPB 1000 mg/200 mL premix  Status:  Discontinued     1,000 mg 200 mL/hr over 60 Minutes Intravenous Every 12 hours 05/27/15 2222 05/31/15 1132   05/28/15 1000  vancomycin (VANCOCIN) 1,500 mg in sodium chloride 0.9 % 500 mL IVPB  Status:  Discontinued     1,500 mg 250 mL/hr over 120 Minutes Intravenous Every 12 hours 05/28/15 0400 05/28/15 0406   05/28/15 0100  piperacillin-tazobactam (ZOSYN) IVPB 3.375 g     3.375 g 12.5 mL/hr over 240 Minutes Intravenous Every 8 hours 05/27/15 2222     05/27/15 2130  vancomycin  (VANCOCIN) 1,500 mg in sodium chloride 0.9 % 500 mL IVPB     1,500 mg 250 mL/hr over 120 Minutes Intravenous  Once 05/27/15 2120 05/28/15 0138   05/27/15 1945  vancomycin (VANCOCIN) IVPB 1000 mg/200 mL premix     1,000 mg 200 mL/hr over 60 Minutes Intravenous  Once 05/27/15 1935 05/27/15 2154   05/27/15 1945  piperacillin-tazobactam (ZOSYN) IVPB 3.375 g     3.375 g 100 mL/hr over 30 Minutes Intravenous  Once 05/27/15 1935 05/27/15 2036      Assessment: 64 yo male with R leg cellulitis was consulted for vancomycin/zosyn per pharmacy.  Due to rise in Scr, vancomycin was changed to clindamycin on 7/30.  Patient has remained afebrile, WBC wnl, total antibiotic day #7, however large amount of purulent discharge noted on 8/1  Goal of Therapy: Resolution of infection   Plan:  - Continue clindamycin 600mg  IV q8h - Continue Zosyn 3.375gm IV Q8H, 4 hr infusion - F/u MRI to r/out abscess   Reatha Harps, Pharm.D., BCPS Clinical Pharmacist [phone] Pager 06/03/2015 11:21 AM   Remigio Eisenmenger D. Mina Marble, PharmD, BCPS Pager:  [phone] 06/03/2015, 3:08 PM

## 2015-06-03 NOTE — Clinical Social Work Note (Signed)
CSW spoke to patient's father Eduardo Herman to discuss SNF placement options.  Patient's father stated that he would like to consider SNF placement for short term rehab for patient.  Patient's father stated he lives close to [facility], which would be a good location, however he would like to know what other options are available.  CSW to complete FL2, and fax out patient's information to [facility].  Formal assessment to follow.  Jones Broom. [place], MSW, [place] 06/03/2015 4:25 PM

## 2015-06-03 NOTE — Care Management Note (Signed)
Case Management Note  Patient Details  Name: Eduardo Herman MRN: [id number] Date of Birth: May 24, 1951  Subjective/Objective:                    Action/Plan: UR updated  Expected Discharge Date:                  Expected Discharge Plan:  Skilled Nursing Facility  In-House Referral:  Clinical Social Work  Discharge planning Services     Post Acute Care Choice:    Choice offered to:     DME Arranged:    DME Agency:     HH Arranged:    [place] Agency:     Status of Service:  In process, will continue to follow  Medicare Important Message Given:  Yes-third notification given Date Medicare IM Given:    Medicare IM give by:    Date Additional Medicare IM Given:    Additional Medicare Important Message give by:     If discussed at [place] of Stay Meetings, dates discussed:    Additional Comments:  Marilu Favre, RN 06/03/2015, 8:35 AM

## 2015-06-03 NOTE — Progress Notes (Signed)
PROGRESS NOTE  Eduardo Herman SAY:[id number] DOB: 03-17-1951 DOA: 05/27/2015 PCP: No primary care provider on file.  HPI/Recap of past 24 hours: Feels well, denies any complaints  Assessment/Plan:  Severe Right lower extremity cellulitis: -venous Duplex negative for DVT.  -Has been on vanc/zosyn, Vanc changed to IV clinda due to AKI 7/30 -has chronic venous stasis changes and previously followed at the wound center Father reported patient has been treated for this for the past several weeks at wound center. -will need several more days of IV Abx -continue current regimen and Po lasix, leg elevation -clinically improving, but 8/1 with large amount of purulent discharge when bullae probed by [facility] RN,  MRI to r/o abscess pending  Sepsis with leukocytosis/fever/celullitis: ivf/abx -improving  Mild AKi vs CKD - likely due to sepsis, stable, Renal dosing meds. -now back on low dose lasix  H/o HTN: home meds atenolol/cardizem/lasix were held due to sepsis.  -Resumed Po lasix, atenolol and cardizem, KCL  Hypokalemia -replaced  Mental retardation  Borderline DM -SSI  DVT proph: lovenox  Code Status: full Family Communication: none at bedside,updated father 8/1 Disposition Plan: home when medically stable   Consultants:  none  Procedures:  none  Antibiotics:  vanc/zosyn   Objective: BP 147/75 mmHg  Pulse 57  Temp(Src) 97.5 F (36.4 C) (Axillary)  Resp 18  Ht 6' (1.829 m)  Wt 127.869 kg (281 lb 14.4 oz)  BMI 38.22 kg/m2  SpO2 95%  Intake/Output Summary (Last 24 hours) at 06/03/15 1308 Last data filed at 06/03/15 0511  Gross per 24 hour  Intake    630 ml  Output      0 ml  Net    630 ml   Filed Weights   05/27/15 2143  Weight: 127.869 kg (281 lb 14.4 oz)    Exam:   General:  NAD  Cardiovascular: RRR  Respiratory: CTABL  Abdomen: Soft/ND/NT, positive BS  Musculoskeletal: right lower extremity erythematous/edematous/ superficial  ulcerations/blisters, large blister superiorly which opened, sero-purulent discharge, improving  Neuro: baseline mental retardation  Data Reviewed: Basic Metabolic Panel:  Recent Labs Lab 05/28/15 0425  05/30/15 0336 05/31/15 2355 06/01/15 0411 06/02/15 0645 06/03/15 0014 06/03/15 0326  NA 140  < > 139 139 140 142 140  --   K 3.4*  < > 3.4* 3.1* 3.3* 3.2* 3.4*  --   CL 109  < > 107 111 111 107 106  --   CO2 23  < > 24 22 20* 26 25  --   GLUCOSE 139*  < > 100* 102* 90 97 116*  --   BUN 12  < > 13 14 17 17 17   --   CREATININE 1.32*  < > 1.43* 1.78* 1.86* 1.94* 1.92* 1.96*  CALCIUM 8.1*  < > 8.1* 7.8* 7.7* 7.9* 7.7*  --   MG 1.8  --   --   --   --   --   --   --   < > = values in this interval not displayed. Liver Function Tests:  Recent Labs Lab 05/27/15 1843 05/28/15 0425 05/29/15 0344 05/30/15 0336  AST 16 16 15 15   ALT 13* 11* 10* 11*  ALKPHOS 96 86 88 106  BILITOT 1.3* 2.1* 1.8* 1.5*  PROT 7.6 6.6 5.9* 6.2*  ALBUMIN 3.1* 2.7* 2.2* 2.2*   No results for input(s): LIPASE, AMYLASE in the last 168 hours. No results for input(s): AMMONIA in the last 168 hours. CBC:  Recent Labs Lab  05/27/15 1843  05/29/15 0344 05/30/15 0336 05/31/15 0311 06/01/15 0411 06/02/15 0645  WBC 17.5*  < > 12.3* 13.5* 12.6* 12.6* 11.2*  NEUTROABS 14.5*  --   --   --   --   --   --   HGB 16.5  < > 14.0 14.6 13.2 13.8 13.9  HCT 49.6  < > 42.8 45.3 40.4 41.8 42.1  MCV 86.9  < > 87.9 88.1 86.9 86.9 85.7  PLT 183  < > 177 202 218 229 268  < > = values in this interval not displayed. Cardiac Enzymes:   No results for input(s): CKTOTAL, CKMB, CKMBINDEX, TROPONINI in the last 168 hours. BNP (last 3 results) No results for input(s): BNP in the last 8760 hours.  ProBNP (last 3 results) No results for input(s): PROBNP in the last 8760 hours.  CBG: No results for input(s): GLUCAP in the last 168 hours.  No results found for this or any previous visit (from the past 240 hour(s)).    Studies: No results found.  Scheduled Meds: . aspirin EC  81 mg Oral Daily  . atenolol  100 mg Oral Daily  . clindamycin (CLEOCIN) IV  600 mg Intravenous 3 times per day  . diltiazem  360 mg Oral Daily  . enoxaparin (LOVENOX) injection  40 mg Subcutaneous Q24H  . furosemide  80 mg Oral Daily  . [START ON 06/04/2015] levothyroxine  200 mcg Oral Once per day on Mon Wed Fri  . levothyroxine  300 mcg Oral Once per day on Sun Tue Thu Sat  . piperacillin-tazobactam (ZOSYN)  IV  3.375 g Intravenous Q8H  . potassium chloride  40 mEq Oral BID    Continuous Infusions:     Time spent: 64mins  Latwan Luchsinger MD  [facility] Pager [phone]. If 7PM-7AM, please contact night-coverage at [email or web address], password [facility] 06/03/2015, 1:08 PM  LOS: 7 days

## 2015-06-04 ENCOUNTER — Telehealth (HOSPITAL_COMMUNITY): Payer: Self-pay

## 2015-06-04 DIAGNOSIS — N179 Acute kidney failure, unspecified: Secondary | ICD-10-CM | POA: Diagnosis present

## 2015-06-04 DIAGNOSIS — N183 Chronic kidney disease, stage 3 unspecified: Secondary | ICD-10-CM | POA: Diagnosis present

## 2015-06-04 LAB — CBC
HCT: 46.8 % (ref 39.0–52.0)
HEMATOCRIT: 43.8 % (ref 39.0–52.0)
Hemoglobin: 14.4 g/dL (ref 13.0–17.0)
Hemoglobin: 15.6 g/dL (ref 13.0–17.0)
MCH: 28.6 pg (ref 26.0–34.0)
MCH: 28.8 pg (ref 26.0–34.0)
MCHC: 32.9 g/dL (ref 30.0–36.0)
MCHC: 33.3 g/dL (ref 30.0–36.0)
MCV: 86.9 fL (ref 78.0–100.0)
MCV: 86.5 fL (ref 78.0–100.0)
Platelets: 293 10*3/uL (ref 150–400)
Platelets: 325 10*3/uL (ref 150–400)
RBC: 5.04 MIL/uL (ref 4.22–5.81)
RBC: 5.41 MIL/uL (ref 4.22–5.81)
RDW: 16.6 % — ABNORMAL HIGH (ref 11.5–15.5)
RDW: 16.4 % — AB (ref 11.5–15.5)
WBC: 10.2 10*3/uL (ref 4.0–10.5)
WBC: 12.3 10*3/uL — ABNORMAL HIGH (ref 4.0–10.5)

## 2015-06-04 LAB — BASIC METABOLIC PANEL
Anion gap: 12 (ref 5–15)
BUN: 14 mg/dL (ref 6–20)
CALCIUM: 7.8 mg/dL — AB (ref 8.9–10.3)
CO2: 27 mmol/L (ref 22–32)
Chloride: 104 mmol/L (ref 101–111)
Creatinine, Ser: 2.08 mg/dL — ABNORMAL HIGH (ref 0.61–1.24)
GFR, EST AFRICAN AMERICAN: 37 mL/min — AB (ref >60)
GFR, EST NON AFRICAN AMERICAN: 32 mL/min — AB (ref >60)
Glucose, Bld: 89 mg/dL (ref 65–99)
POTASSIUM: 3.2 mmol/L — AB (ref 3.5–5.1)
Sodium: 143 mmol/L (ref 135–145)

## 2015-06-04 MED ORDER — ENOXAPARIN SODIUM 40 MG/0.4ML ~~LOC~~ SOLN: 40.0000 mg | SUBCUTANEOUS | Status: DC

## 2015-06-04 MED ORDER — DEXTROSE-NACL 5-0.45 % IV SOLN
100.0000 mL/h | INTRAVENOUS | Status: AC
  Administered 2015-06-04 – 2015-06-05 (×3): 100 mL/h via INTRAVENOUS

## 2015-06-04 MED ORDER — DEXTROSE 5 % IV SOLN
3.0000 g | INTRAVENOUS | Status: DC
  Filled 2015-06-04 (×2): qty 3000

## 2015-06-04 MED ORDER — ACETAMINOPHEN 500 MG PO TABS
1000.0000 mg | ORAL_TABLET | Freq: Once | ORAL | Status: AC
  Administered 2015-06-04: 1000 mg via ORAL
  Filled 2015-06-04: qty 2

## 2015-06-04 NOTE — Progress Notes (Signed)
NT entered room to help pt get up to chair and wound started bleeding significant amount.  MD P. Broadus John notified and ordered to hold pressure.  Pressure was/has been held for over an hour.  MD P. Broadus John came to see pt and assess wound.  Dr. Broadus John stated to hold pressure for 15 more minutes and then page her.  Pressure was held by NT.  Bleeding is not occuring as rapidly, but still with continual bleed.  MD Broadus John paged Dr. Percell Miller on call and he stated to place a compression dressing on leg with ace wrap placed very tightly as well.  Will carry out order and call MD again if bleeding still has not subsided. Graceann Congress

## 2015-06-04 NOTE — Clinical Social Work Note (Signed)
Patient has been faxed out to [facility] per family request.  Jones Broom. [place], MSW, [place]

## 2015-06-04 NOTE — Clinical Social Work Note (Signed)
Clinical Social Work Assessment  Patient Details  Name: Eduardo Herman MRN: [id number] Date of Birth: [birth year]  Date of referral:  06/03/15               Reason for consult:  Facility Placement                Permission sought to share information with:  Family Supports Permission granted to share information::  Yes, Verbal Permission Granted  Name::     Eduardo Herman patient's father  Agency::  Snf admissions  Relationship::     Contact Information:     Housing/Transportation Living arrangements for the past 2 months:  Single Family Home Source of Information:  Parent, Patient Patient Interpreter Needed:  None Criminal Activity/Legal Involvement Pertinent to Current Situation/Hospitalization:  No - Comment as needed Significant Relationships:  Parents Lives with:  Parents Do you feel safe going back to the place where you live?  Yes Need for family participation in patient care:  Yes (Comment) (Patient has Mild MR and requests his father to be main contact)  Care giving concerns:  Patient lives with his dad who is his primary support.  PT is recommending some short term rehab in order to gain strength back and to help his infection heal.   Social Worker assessment / plan:  Patient is a pleasant 64 year old male who lives with his father and has mild MR.  Patient requested CSW to speak to his father Eduardo Herman to determine discharge plan.  Patient's dad states patient was completing most things independently prior to his getting his cellulitis.  Patient's dad states that once patient's cellulitis is healed patient hopefully will be back to his normal self.  Patient's father stated that he is in agreement to patient going to SNF for short term rehab.  Patient's dad states he has never been before, but feels he will do all right as long as SNF is for short term.  Patient's dad was explained role of CSW, and he was explained that insurance will help pay for patient's stay at a SNF.  Patient's  dad did not have any other questions.  Employment status:  Disabled (Comment on whether or not currently receiving Disability) Insurance information:  Medicare, Medicaid In [place] PT Recommendations:  [place] / Referral to community resources:  [place]  Patient/Family's Response to care:  Patient and his dad are in agreement to going to SNF for short term rehab.  Patient/Family's Understanding of and Emotional Response to Diagnosis, Current Treatment, and Prognosis:  Patient's dad expressed he understands the patient would be at SNF for short term rehab and then return back home.  Emotional Assessment Appearance:  Appears stated age Attitude/Demeanor/Rapport:    Affect (typically observed):  Accepting, Pleasant, Appropriate Orientation:  Oriented to Self, Oriented to Place Alcohol / Substance use:  Not Applicable Psych involvement (Current and /or in the community):  No (Comment)  Discharge Needs  Concerns to be addressed:  Developmental Concerns Readmission within the last 30 days:  No Current discharge risk:  Cognitively Impaired Barriers to Discharge:  Other (Patient has mild MR and lives with his dad who helps take care of him.)   Anell Barr 06/03/2015 5:15 pm

## 2015-06-04 NOTE — Progress Notes (Signed)
Pressure dressing (gauze, kerlix, and ace wrap) have been applied for approximately 40 min and no signs of further bleeding at this time.  Will continue to monitor. Graceann Congress

## 2015-06-04 NOTE — Progress Notes (Signed)
PASAR number received: [id number]  CSW will continue to follow  Domenica Reamer, [place] Social Worker [phone]

## 2015-06-04 NOTE — Clinical Social Work Placement (Signed)
   CLINICAL SOCIAL WORK PLACEMENT  NOTE  Date:  06/04/2015  Patient Details  Name: Eduardo Herman MRN: [id number] Date of Birth: 19-Feb-1951  Clinical Social Work is seeking post-discharge placement for this patient at the [place] level of care (*CSW will initial, date and re-position this form in  chart as items are completed):  Yes   Patient/family provided with [place] Work Department's list of facilities offering this level of care within the geographic area requested by the patient (or if unable, by the patient's family).  Yes   Patient/family informed of their freedom to choose among providers that offer the needed level of care, that participate in Medicare, Medicaid or managed care program needed by the patient, have an available bed and are willing to accept the patient.  Yes   Patient/family informed of [facility]'s ownership interest in [facility] and [facility], as well as of the fact that they are under no obligation to receive care at these facilities.  PASRR submitted to EDS on 06/04/15     PASRR number received on       Existing PASRR number confirmed on       FL2 transmitted to all facilities in geographic area requested by pt/family on 06/04/15     FL2 transmitted to all facilities within larger geographic area on       Patient informed that his/her managed care company has contracts with or will negotiate with certain facilities, including the following:            Patient/family informed of bed offers received.  Patient chooses bed at       Physician recommends and patient chooses bed at      Patient to be transferred to   on  .  Patient to be transferred to facility by       Patient family notified on   of transfer.  Name of family member notified:        PHYSICIAN Please sign FL2     Additional Comment:    _______________________________________________ Ross Ludwig, Latanya Presser [phone] 06/03/15  5:30 pm

## 2015-06-04 NOTE — Consult Note (Signed)
ORTHOPAEDIC CONSULTATION  REQUESTING PHYSICIAN: Domenic Polite, MD  Chief Complaint: right leg pain  HPI: Eduardo Herman is a 64 y.o. male who has a complicated medical history. He has had venous stasis and swelling of the legs chronically. The right leg developed pain and erythema 1 week ago. He has been in the hospital for several days on antibiotics. He is feeling better and pain is improved. MRI demonstrates a soft tissue fluid collection in the setting of significant edema. He feels systemically well.   Past Medical History  Diagnosis Date  . Down's syndrome   . Hypertension   . Hypothyroidism   . Mental disorder   . Shortness of breath   . Peripheral vascular disease   . Angioedema of lips     likely secondary to ACE inhibitor.    Past Surgical History  Procedure Laterality Date  . Cholecystectomy     History   Social History  . Marital Status: Single    Spouse Name: N/A  . Number of Children: N/A  . Years of Education: N/A   Social History Main Topics  . Smoking status: Current Every Day Smoker  . Smokeless tobacco: Never Used  . Alcohol Use: No  . Drug Use: No  . Sexual Activity: No   Other Topics Concern  . None   Social History Narrative   History reviewed. No pertinent family history. Allergies  Allergen Reactions  . Lisinopril Swelling    Angioedema of the tongue   Prior to Admission medications   Medication Sig Start Date End Date Taking? Authorizing Provider  atenolol (TENORMIN) 100 MG tablet Take 100 mg by mouth daily.    Historical Provider, MD  diltiazem (CARDIZEM CD) 180 MG 24 hr capsule Take 180 mg by mouth 2 (two) times daily. 04/23/15   Historical Provider, MD  furosemide (LASIX) 80 MG tablet Take 40 mg by mouth daily.     Historical Provider, MD  levothyroxine (SYNTHROID, LEVOTHROID) 200 MCG tablet Take 200-300 mcg by mouth daily. 200 mcg Monday, Wednesday, and Friday.  300 mcg Tuesday, Thursday, Saturday, and Sunday.    Historical  Provider, MD  Potassium Chloride ER 20 MEQ TBCR Take 20 mEq by mouth 3 (three) times daily. 12/11/13   Vicie Mutters, PA-C   Mr Tibia Fibula Right Wo Contrast  06/03/2015   CLINICAL DATA:  Cellulitis and draining in the upper 1/3 of the right lower leg. Leukocytosis. Question abscess.  EXAM: MRI OF LOWER RIGHT EXTREMITY WITHOUT CONTRAST  TECHNIQUE: Multiplanar, multisequence MR imaging of the right lower leg was performed. No intravenous contrast was administered.  COMPARISON:  Plain films right lower leg 07/20/2004.  FINDINGS: The axial and coronal sequences incidentally include the left lower leg. There is extensive subcutaneous edema about the right lower leg. In the subcutaneous tissues along the medial aspect of the right lower leg centered approximately 4 cm below the medial tibial plateau, there is a focal fluid collection which demonstrates mildly mixed signal on T1 weighted imaging and is T2 hyperintense. No other focal fluid collection is identified. There is no bone marrow signal abnormality to suggest osteomyelitis. No fracture or mass is seen. Incidentally imaged left lower leg demonstrates mild subcutaneous edema. Fatty atrophy of musculature of the lower lobes bilaterally is most likely due to disuse.  IMPRESSION: Extensive subcutaneous edema about the right lower leg is compatible with cellulitis. A focal subcutaneous collection in the anteromedial tissues approximately 4 cm below the medial tibial plateau shows  some T1 hyperintensity which could be due to the presence of protein in an abscess and/or hemorrhage. There is no intramuscular fluid collection no evidence of osteomyelitis.   Electronically Signed   By: Inge Rise M.D.   On: 06/03/2015 14:17    Positive ROS: All other systems have been reviewed and were otherwise negative with the exception of those mentioned in the HPI and as above.  Labs cbc  Recent Labs  06/02/15 0645 06/04/15 0440  WBC 11.2* 10.2  HGB 13.9 14.4    HCT 42.1 43.8  PLT 268 293    Labs inflam No results for input(s): CRP in the last 72 hours.  Invalid input(s): ESR  Labs coag No results for input(s): INR, PTT in the last 72 hours.  Invalid input(s): PT   Recent Labs  06/03/15 0014 06/03/15 0326 06/04/15 0440  NA 140  --  143  K 3.4*  --  3.2*  CL 106  --  104  CO2 25  --  27  GLUCOSE 116*  --  89  BUN 17  --  14  CREATININE 1.92* 1.96* 2.08*  CALCIUM 7.7*  --  7.8*    Physical Exam: Filed Vitals:   06/04/15 0532  BP: 156/70  Pulse: 56  Temp: 97.8 F (36.6 C)  Resp: 16   General: Alert, no acute distress Cardiovascular: No pedal edema Respiratory: No cyanosis, no use of accessory musculature GI: No organomegaly, abdomen is soft and non-tender Skin: No lesions in the area of chief complaint other than those listed below in MSK exam.  Neurologic: Sensation intact distally Psychiatric: Patient is mentally handicapped. Lymphatic: No axillary or cervical lymphadenopathy  MUSCULOSKELETAL:  RLE: erythema and swelling. He has a draining ulcer at his medial leg. Distally he does not participate well in neuro/motor exam. He has palpable pulses.   Other extremities are atraumatic with painless ROM and NVI.  Assessment: Right leg cellulitis.   Plan: There is a very small fluid collection that may have auto drained but it is difficult to correlate MRI finding with location of draining wound. I would recommend continued Abx, I have asked for a US guided aspiration of the fluid collection. This may be currative but if it is an abscess and does not get better with Radiology aspiration I will perform a surgical I&D.   WBAT  I will await aspiration results and have made him NPO at midnight should surgery be required or sedation for his aspiration.     Renette Butters, MD Cell [phone]   06/04/2015 8:33 AM

## 2015-06-04 NOTE — Progress Notes (Signed)
IR received request for image guided right lower extremity fluid collection aspiration. Images reviewed with Dr. Earleen Newport and he approved US aspiration with local anesthetic only. Unfortunately I have called the patient's father today multiple times without success and am unable to obtain consent. RN notified and will try for 8/4.  Tsosie Billing PA-C Interventional Radiology  06/04/15  1:47 PM

## 2015-06-04 NOTE — Progress Notes (Signed)
PROGRESS NOTE  Eduardo Herman DGL:[id number] DOB: [birth year] DOA: 05/27/2015 PCP: No primary care provider on file.  HPI/Recap of past 24 hours: Feels well, denies any complaints  Assessment/Plan:  Severe Right lower extremity cellulitis: -venous Duplex negative for DVT.  -Has been on vanc/zosyn, Vanc changed to IV clinda due to AKI 7/30 -has chronic venous stasis changes and previously followed at the wound center Father reported patient has been treated for this for the past several weeks at wound center. -will need several more days of IV Abx -continue current regimen and Po lasix, leg elevation -clinically improving, but 8/1 with large amount of purulent discharge when bullae probed by [facility] RN,  MRI finally completed 8/2 after multiple attempts, shows extensive subcutaneous edema about the right lower leg is compatible with cellulitis. A focal subcutaneous collection in the anteromedial tissues approximately 4 cm below the medial tibial plateau, asked Dr.Murphy to evaluate for I&D.  Mental retardation  Sepsis with leukocytosis/fever/celullitis: ivf/abx -improving  AKi on CKD - likely due to sepsis, Vancomycin, Renal dosing meds. -now back on low dose lasix -monitor  H/o HTN: home meds atenolol/cardizem/lasix were held due to sepsis.  -Resumed Po lasix, atenolol and cardizem, KCL  Hypokalemia -replaced  Borderline DM -SSI  DVT proph: lovenox  Code Status: full Family Communication: none at bedside,updated father 8/1 he  Disposition Plan: home when medically stable   Consultants:  none  Procedures:  none  Antibiotics:  vanc/zosyn   Objective: BP 161/73 mmHg  Pulse 60  Temp(Src) 98.6 F (37 C) (Axillary)  Resp 16  Ht 6' (1.829 m)  Wt 127.869 kg (281 lb 14.4 oz)  BMI 38.22 kg/m2  SpO2 95%  Intake/Output Summary (Last 24 hours) at 06/04/15 1309 Last data filed at 06/04/15 0930  Gross per 24 hour  Intake    770 ml  Output      1 ml  Net    769 ml    Filed Weights   05/27/15 2143  Weight: 127.869 kg (281 lb 14.4 oz)    Exam:   General:  NAD, pleasant, no distress  Cardiovascular: RRR  Respiratory: CTABL  Abdomen: Soft/ND/NT, positive BS  Musculoskeletal: right lower extremity erythematous/edematous/ superficial ulcerations/blisters, large blister superiorly which opened, sero-purulent discharge, improving  Neuro: baseline mental retardation  Data Reviewed: Basic Metabolic Panel:  Recent Labs Lab 05/31/15 0311 06/01/15 0411 06/02/15 0645 06/03/15 0014 06/03/15 0326 06/04/15 0440  NA 139 140 142 140  --  143  K 3.1* 3.3* 3.2* 3.4*  --  3.2*  CL 111 111 107 106  --  104  CO2 22 20* 26 25  --  27  GLUCOSE 102* 90 97 116*  --  89  BUN 14 17 17 17   --  14  CREATININE 1.78* 1.86* 1.94* 1.92* 1.96* 2.08*  CALCIUM 7.8* 7.7* 7.9* 7.7*  --  7.8*   Liver Function Tests:  Recent Labs Lab 05/29/15 0344 05/30/15 0336  AST 15 15  ALT 10* 11*  ALKPHOS 88 106  BILITOT 1.8* 1.5*  PROT 5.9* 6.2*  ALBUMIN 2.2* 2.2*   No results for input(s): LIPASE, AMYLASE in the last 168 hours. No results for input(s): AMMONIA in the last 168 hours. CBC:  Recent Labs Lab 05/30/15 0336 05/31/15 0311 06/01/15 0411 06/02/15 0645 06/04/15 0440  WBC 13.5* 12.6* 12.6* 11.2* 10.2  HGB 14.6 13.2 13.8 13.9 14.4  HCT 45.3 40.4 41.8 42.1 43.8  MCV 88.1 86.9 86.9 85.7 86.9  PLT 202  218 229 268 293   Cardiac Enzymes:   No results for input(s): CKTOTAL, CKMB, CKMBINDEX, TROPONINI in the last 168 hours. BNP (last 3 results) No results for input(s): BNP in the last 8760 hours.  ProBNP (last 3 results) No results for input(s): PROBNP in the last 8760 hours.  CBG: No results for input(s): GLUCAP in the last 168 hours.  No results found for this or any previous visit (from the past 240 hour(s)).   Studies: Mr Tibia Fibula Right Wo Contrast  06/03/2015   CLINICAL DATA:  Cellulitis and draining in the upper 1/3 of the right lower  leg. Leukocytosis. Question abscess.  EXAM: MRI OF LOWER RIGHT EXTREMITY WITHOUT CONTRAST  TECHNIQUE: Multiplanar, multisequence MR imaging of the right lower leg was performed. No intravenous contrast was administered.  COMPARISON:  Plain films right lower leg 07/20/2004.  FINDINGS: The axial and coronal sequences incidentally include the left lower leg. There is extensive subcutaneous edema about the right lower leg. In the subcutaneous tissues along the medial aspect of the right lower leg centered approximately 4 cm below the medial tibial plateau, there is a focal fluid collection which demonstrates mildly mixed signal on T1 weighted imaging and is T2 hyperintense. No other focal fluid collection is identified. There is no bone marrow signal abnormality to suggest osteomyelitis. No fracture or mass is seen. Incidentally imaged left lower leg demonstrates mild subcutaneous edema. Fatty atrophy of musculature of the lower lobes bilaterally is most likely due to disuse.  IMPRESSION: Extensive subcutaneous edema about the right lower leg is compatible with cellulitis. A focal subcutaneous collection in the anteromedial tissues approximately 4 cm below the medial tibial plateau shows some T1 hyperintensity which could be due to the presence of protein in an abscess and/or hemorrhage. There is no intramuscular fluid collection no evidence of osteomyelitis.   Electronically Signed   By: Inge Rise M.D.   On: 06/03/2015 14:17    Scheduled Meds: . aspirin EC  81 mg Oral Daily  . atenolol  100 mg Oral Daily  . clindamycin (CLEOCIN) IV  600 mg Intravenous 3 times per day  . diltiazem  360 mg Oral Daily  . enoxaparin (LOVENOX) injection  40 mg Subcutaneous Q24H  . furosemide  80 mg Oral Daily  . levothyroxine  200 mcg Oral Once per day on Mon Wed Fri  . levothyroxine  300 mcg Oral Once per day on Sun Tue Thu Sat  . piperacillin-tazobactam (ZOSYN)  IV  3.375 g Intravenous Q8H  . potassium chloride  40 mEq  Oral BID    Continuous Infusions:     Time spent: 34mins  Eduardo Fratto MD  [facility] Pager [phone]. If 7PM-7AM, please contact night-coverage at [email or web address], password [facility] 06/04/2015, 1:09 PM  LOS: 8 days

## 2015-06-05 ENCOUNTER — Inpatient Hospital Stay (HOSPITAL_COMMUNITY): Payer: Medicare Other | Admitting: Certified Registered Nurse Anesthetist

## 2015-06-05 LAB — CBC
HCT: 41.9 % (ref 39.0–52.0)
Hemoglobin: 13.5 g/dL (ref 13.0–17.0)
MCH: 27.7 pg (ref 26.0–34.0)
MCHC: 32.2 g/dL (ref 30.0–36.0)
MCV: 85.9 fL (ref 78.0–100.0)
Platelets: 291 10*3/uL (ref 150–400)
RBC: 4.88 MIL/uL (ref 4.22–5.81)
RDW: 16.4 % — AB (ref 11.5–15.5)
WBC: 9.6 10*3/uL (ref 4.0–10.5)

## 2015-06-05 LAB — BASIC METABOLIC PANEL
ANION GAP: 9 (ref 5–15)
BUN: 13 mg/dL (ref 6–20)
CO2: 27 mmol/L (ref 22–32)
Calcium: 7.6 mg/dL — ABNORMAL LOW (ref 8.9–10.3)
Chloride: 104 mmol/L (ref 101–111)
Creatinine, Ser: 1.98 mg/dL — ABNORMAL HIGH (ref 0.61–1.24)
GFR calc non Af Amer: 34 mL/min — ABNORMAL LOW (ref >60)
GFR, EST AFRICAN AMERICAN: 39 mL/min — AB (ref >60)
GLUCOSE: 107 mg/dL — AB (ref 65–99)
Potassium: 3.2 mmol/L — ABNORMAL LOW (ref 3.5–5.1)
Sodium: 140 mmol/L (ref 135–145)

## 2015-06-05 NOTE — Progress Notes (Signed)
Physical Therapy Treatment Patient Details Name: Eduardo Herman MRN: [id number] DOB: 1951/08/06 Today's Date: 06/05/2015    History of Present Illness 64 yo male with CP and MR was admitted for drainage RLE wound with I&D consult, referred to PT to restore his mobility as able.    PT Comments    Patient making improvements with mobility and gait.  Agree with need for SNF at discharge.  Follow Up Recommendations  SNF;Supervision/Assistance - 24 hour     Equipment Recommendations  Rolling walker with 5" wheels    Recommendations for Other Services       Precautions / Restrictions Precautions Precautions: Fall Restrictions Weight Bearing Restrictions: No    Mobility  Bed Mobility Overal bed mobility: Needs Assistance Bed Mobility: Supine to Sit     Supine to sit: Mod assist     General bed mobility comments: Patient able to move to sitting.  Assist needed to scoot to edge of bed.  Transfers Overall transfer level: Needs assistance Equipment used: Rolling walker (2 wheeled) Transfers: Sit to/from [facility] Sit to Stand: Mod assist;+2 physical assistance Stand pivot transfers: Min assist;+2 safety/equipment       General transfer comment: Patient attempted to move to standing x2 unsuccessfully.  On 3rd attempt, was able to stand with +2 mod assist.  Once upright, patient able to take several steps to pivot to [facility] with min assist.  Mod assist to stand from [facility], with assist provided for pericare following BM.    Ambulation/Gait Ambulation/Gait assistance: Min assist;+2 safety/equipment Ambulation Distance (Feet): 35 Feet Assistive device: Rolling walker (2 wheeled) Gait Pattern/deviations: Step-through pattern;Decreased step length - right;Decreased step length - left;Decreased stride length;Decreased weight shift to right;Antalgic Gait velocity: Decreased Gait velocity interpretation: Below normal speed for age/gender General Gait Details: Verbal  cues to stay close to RW - patient pushing RW too far ahead of himself.  Cues to move slowly for safety.  Flexed posture during gait.   Stairs            Wheelchair Mobility    Modified Rankin (Stroke Patients Only)       Balance           Standing balance support: Bilateral upper extremity supported Standing balance-Leahy Scale: Poor                      Cognition Arousal/Alertness: Awake/alert Behavior During Therapy: Restless;Anxious Overall Cognitive Status: History of cognitive impairments - at baseline       Memory: Decreased short-term memory              Exercises      General Comments        Pertinent Vitals/Pain Pain Assessment: Faces Faces Pain Scale: Hurts little more Pain Location: RLE Pain Intervention(s): Monitored during session;Repositioned    Home Living                      Prior Function            PT Goals (current goals can now be found in the care plan section) Progress towards PT goals: Progressing toward goals    Frequency  Min 2X/week    PT Plan Current plan remains appropriate    Co-evaluation             End of Session Equipment Utilized During Treatment: Gait belt Activity Tolerance: Patient tolerated treatment well Patient left: in chair;with call bell/phone within reach  Time: 1030-1314 PT Time Calculation (min) (ACUTE ONLY): 26 min  Charges:  $Gait Training: 8-22 mins $Therapeutic Activity: 8-22 mins                    G Codes:      [name] 2015/06/08, 5:28 PM [name], [place] Pager [phone]

## 2015-06-05 NOTE — Care Management Note (Signed)
Case Management Note  Patient Details  Name: Eduardo Herman MRN: [id number] Date of Birth: 1951-03-02  Subjective/Objective:                    Action/Plan: UR updated   Expected Discharge Date:                  Expected Discharge Plan:  Skilled Nursing Facility  In-House Referral:  Clinical Social Work  Discharge planning Services     Post Acute Care Choice:    Choice offered to:     DME Arranged:    DME Agency:     HH Arranged:    [place] Agency:     Status of Service:  In process, will continue to follow  Medicare Important Message Given:  Yes-third notification given Date Medicare IM Given:    Medicare IM give by:    Date Additional Medicare IM Given:    Additional Medicare Important Message give by:     If discussed at [place] of Stay Meetings, dates discussed:  06-05-15   Additional Comments:  Marilu Favre, RN 06/05/2015, 11:43 AM

## 2015-06-05 NOTE — Progress Notes (Signed)
Patient ID: Eduardo Herman, male   DOB: Sep 29, 1951, 64 y.o.   MRN: [id number] Consent obtained from pt's father for US guided aspiration of RLE fluid collection. Will plan to eval initially with [place] and if sufficient fluid present attempt aspiration under local anesthesia today if schedule allows.

## 2015-06-05 NOTE — Progress Notes (Signed)
Patient ID: Eduardo Herman, male   DOB: [birth year], 64 y.o.   MRN: [id number] Per nurse report this am family member still has not been reached to obtain consent for RLE fluid collection aspiration. Will cont to monitor. Please call [phone] with any changes in plan.

## 2015-06-05 NOTE — Progress Notes (Signed)
     Subjective:  R leg cellulitis. Patient reports pain as mild to moderate.  Up to a chair this morning.  Nursing notice leg draining blood last night. Pressure held for greater than one hour then a compression dressing applied.  No active bleeding or drainage today when dressing removed.  Patient tolerated redressing without significant discomfort.  No purulent fluid was able to be expressed.  Will hold on surgical I/D at this time.  Will continue to try to get consent for aspiration if necessary.  Small fluid collection seen on MRI may have drained with fluid last night. No fevers overnight.  Objective:   VITALS:   Filed Vitals:   06/04/15 1554 06/04/15 2130 06/05/15 0448 06/05/15 0500  BP: 150/89 157/88 131/71   Pulse: 63 56 127 60  Temp: 98.6 F (37 C) 97.6 F (36.4 C) 97.6 F (36.4 C)   TempSrc: Axillary Oral Oral   Resp: 18 20 18    Height:      Weight:      SpO2: 94% 96% 94%    RLE: erythema and swelling. 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 No purulent drainage was able to be expressed at bedside today. Distally he does not participate well in neuro/motor exam. He has palpable pulses.  Other extremities are atraumatic with painless ROM and NVI.  Lab Results  Component Value Date   WBC 9.6 06/05/2015   HGB 13.5 06/05/2015   HCT 41.9 06/05/2015   MCV 85.9 06/05/2015   PLT 291 06/05/2015   BMET    Component Value Date/Time   NA 140 06/05/2015 0328   K 3.2* 06/05/2015 0328   CL 104 06/05/2015 0328   CO2 27 06/05/2015 0328   GLUCOSE 107* 06/05/2015 0328   BUN 13 06/05/2015 0328   CREATININE 1.98* 06/05/2015 0328   CREATININE 0.99 11/28/2013 1536   CALCIUM 7.6* 06/05/2015 0328   GFRNONAA 34* 06/05/2015 0328   GFRNONAA 80 11/28/2013 1536   GFRAA 39* 06/05/2015 0328   GFRAA >89 11/28/2013 1536     Assessment/Plan:     Active Problems:   Cellulitis of right leg   CKD (chronic kidney disease), stage III   AKI (acute kidney injury)   Up with  therapy WBAT in the RLE Will hold on surgical I/D today and see how the wound does.  Will consider VIR aspiration if wound does not improve. Will order a diet today and plan for NPO after midnight if surgical I/D necessary.    Reshawn Ostlund Lelan Pons 06/05/2015, 9:20 AM Cell [phone]

## 2015-06-05 NOTE — Progress Notes (Addendum)
PROGRESS NOTE  Eduardo Herman MCN:[id number] DOB: 12/28/1950 DOA: 05/27/2015 PCP: No primary care provider on file.   Narrative: 64 year old male with history of mental retardation, borderline diabetes, renal insufficiency, chronic venous stasis, previously cared for [facility] was admitted with severe right lower extremity cellulitis. He has been very slow to improve, his venous duplex was negative for DVT, initially on vancomycin and Zosyn subsequently vancomycin was changed to clindamycin due to acute kidney injury and to slow improvement and purulent drainage from wound probed by the nurse she underwent an MRI scan on 8/2 after multiple attempts , this shows an area of fluid collection subsequently orthopedics consultation was obtained, currently IR following for possible ultrasound-guided aspiration. On 8/3 he had spontaneous bleeding/ooze from the wound, this stopped after we kept pressure on the wound for a long time and put a pressure dressing over it.   HPI/Recap of past 24 hours: Feels well, denies any complaints, no further bleeding yetserday afternoon  Assessment/Plan:  Severe Right lower extremity cellulitis: -venous Duplex negative for DVT.  -Has been on vanc/zosyn, Vanc changed to IV clinda due to AKI on 7/30 -has chronic venous stasis changes and previously followed at the wound center Father reported patient has been treated for this for the past several weeks at wound center. -will need several more days of IV Abx -continue current regimen and Po lasix, leg elevation -clinically improving, but 8/1 with large amount of purulent discharge when bullae probed by [facility] RN,  MRI finally completed 8/2 after multiple attempts, shows extensive subcutaneous edema about the right lower leg is compatible with cellulitis. A focal subcutaneous collection in the anteromedial tissues approximately 4 cm below the medial tibial plateau, evaluated by Dr.Murphy, US guided aspiration recommended,  IR couldn't reach father yesteday for consent. 8/3 evening had moderate active bleeding superiorly near a rutured bullus, required pressure and pressure dressing to finally stop this. -D/w IR today, not sure if there is anything at this point that could be drained but [place] may be beneficial to discern this  Mental retardation -cared for by family -stable, father Eduardo Herman can be reached at [phone]  Sepsis with leukocytosis/fever/celullitis: ivf/abx -improving  AKi on CKD -baseline unknown - likely due to sepsis, Vancomycin, Renal dosing meds. -now back on low dose lasix -monitor, stabilizing  H/o HTN: home meds atenolol/cardizem/lasix were held due to sepsis.  -Resumed Po lasix, atenolol and cardizem, KCL  Hypokalemia -replaced  Borderline DM -SSI  DVT proph: lovenox stopped 8/3 due to bleeding from wound resume in 1-2days if no further bleeding  Code Status: full Family Communication: none at bedside,updated father 8/1 and again 8/4 Disposition Plan: [facility] when stable    Consultants  Orthopedics Dr. Percell Miller    interventional radiology  Procedures:  none  Antibiotics:  Vanc-7/26-7/30  Zosyn 726 - current   Clindamycin 7/30- current    Objective: BP 131/71 mmHg  Pulse 60  Temp(Src) 97.6 F (36.4 C) (Oral)  Resp 18  Ht 6' (1.829 m)  Wt 127.869 kg (281 lb 14.4 oz)  BMI 38.22 kg/m2  SpO2 94%  Intake/Output Summary (Last 24 hours) at 06/05/15 1239 Last data filed at 06/05/15 0835  Gross per 24 hour  Intake    860 ml  Output      0 ml  Net    860 ml   Filed Weights   05/27/15 2143  Weight: 127.869 kg (281 lb 14.4 oz)    Exam:   General:  NAD,  pleasant, no distress  Cardiovascular: RRR  Respiratory: CTABL  Abdomen: Soft/ND/NT, positive BS  Musculoskeletal: right lower extremity erythematous/edematous/ superficial ulcerations/blisters, large blister superiorly which opened, improved, pressure dressing on superiorly  Neuro: baseline  mental retardation  Data Reviewed: Basic Metabolic Panel:  Recent Labs Lab 06/01/15 0411 06/02/15 0645 06/03/15 0014 06/03/15 0326 06/04/15 0440 06/05/15 0328  NA 140 142 140  --  143 140  K 3.3* 3.2* 3.4*  --  3.2* 3.2*  CL 111 107 106  --  104 104  CO2 20* 26 25  --  27 27  GLUCOSE 90 97 116*  --  89 107*  BUN 17 17 17   --  14 13  CREATININE 1.86* 1.94* 1.92* 1.96* 2.08* 1.98*  CALCIUM 7.7* 7.9* 7.7*  --  7.8* 7.6*   Liver Function Tests:  Recent Labs Lab 05/30/15 0336  AST 15  ALT 11*  ALKPHOS 106  BILITOT 1.5*  PROT 6.2*  ALBUMIN 2.2*   No results for input(s): LIPASE, AMYLASE in the last 168 hours. No results for input(s): AMMONIA in the last 168 hours. CBC:  Recent Labs Lab 06/01/15 0411 06/02/15 0645 06/04/15 0440 06/04/15 1623 06/05/15 0328  WBC 12.6* 11.2* 10.2 12.3* 9.6  HGB 13.8 13.9 14.4 15.6 13.5  HCT 41.8 42.1 43.8 46.8 41.9  MCV 86.9 85.7 86.9 86.5 85.9  PLT 229 268 293 325 291   Cardiac Enzymes:   No results for input(s): CKTOTAL, CKMB, CKMBINDEX, TROPONINI in the last 168 hours. BNP (last 3 results) No results for input(s): BNP in the last 8760 hours.  ProBNP (last 3 results) No results for input(s): PROBNP in the last 8760 hours.  CBG: No results for input(s): GLUCAP in the last 168 hours.  No results found for this or any previous visit (from the past 240 hour(s)).   Studies: No results found.  Scheduled Meds: . atenolol  100 mg Oral Daily  .  ceFAZolin (ANCEF) IV  3 g Intravenous To SS-Surg  . clindamycin (CLEOCIN) IV  600 mg Intravenous 3 times per day  . diltiazem  360 mg Oral Daily  . furosemide  80 mg Oral Daily  . levothyroxine  200 mcg Oral Once per day on Mon Wed Fri  . levothyroxine  300 mcg Oral Once per day on Sun Tue Thu Sat  . piperacillin-tazobactam (ZOSYN)  IV  3.375 g Intravenous Q8H  . potassium chloride  40 mEq Oral BID    Continuous Infusions: . dextrose 5 % and 0.45% NaCl 100 mL/hr (06/05/15 0032)       Time spent: 44mins  Shandra Szymborski MD  [facility] Pager [phone]. If 7PM-7AM, please contact night-coverage at [email or web address], password [facility] 06/05/2015, 12:39 PM  LOS: 9 days

## 2015-06-05 NOTE — Care Management Important Message (Signed)
Important Message  Patient Details  Name: Eduardo Herman MRN: [id number] Date of Birth: 1951/06/30   Medicare Important Message Given:  Yes-fourth notification given    Delorse Lek 06/05/2015, 2:32 PM

## 2015-06-06 ENCOUNTER — Inpatient Hospital Stay (HOSPITAL_COMMUNITY): Payer: Medicare Other

## 2015-06-06 ENCOUNTER — Encounter (HOSPITAL_COMMUNITY)
Admission: EM | Disposition: A | Discharge status: Home / Self Care | Payer: Self-pay | Source: Home / Self Care | Attending: Internal Medicine

## 2015-06-06 ENCOUNTER — Encounter (HOSPITAL_COMMUNITY): Payer: Self-pay | Admitting: Anesthesiology

## 2015-06-06 LAB — BASIC METABOLIC PANEL
Anion gap: 9 (ref 5–15)
BUN: 10 mg/dL (ref 6–20)
CO2: 27 mmol/L (ref 22–32)
Calcium: 7.6 mg/dL — ABNORMAL LOW (ref 8.9–10.3)
Chloride: 102 mmol/L (ref 101–111)
Creatinine, Ser: 2.1 mg/dL — ABNORMAL HIGH (ref 0.61–1.24)
GFR calc non Af Amer: 31 mL/min — ABNORMAL LOW (ref >60)
GFR, EST AFRICAN AMERICAN: 36 mL/min — AB (ref >60)
Glucose, Bld: 110 mg/dL — ABNORMAL HIGH (ref 65–99)
Potassium: 3.2 mmol/L — ABNORMAL LOW (ref 3.5–5.1)
Sodium: 138 mmol/L (ref 135–145)

## 2015-06-06 LAB — CBC
HCT: 41.8 % (ref 39.0–52.0)
HEMOGLOBIN: 13.6 g/dL (ref 13.0–17.0)
MCH: 28.2 pg (ref 26.0–34.0)
MCHC: 32.5 g/dL (ref 30.0–36.0)
MCV: 86.7 fL (ref 78.0–100.0)
Platelets: 298 10*3/uL (ref 150–400)
RBC: 4.82 MIL/uL (ref 4.22–5.81)
RDW: 16.4 % — ABNORMAL HIGH (ref 11.5–15.5)
WBC: 10.5 10*3/uL (ref 4.0–10.5)

## 2015-06-06 LAB — SURGICAL PCR SCREEN
MRSA, PCR: NEGATIVE
STAPHYLOCOCCUS AUREUS: NEGATIVE

## 2015-06-06 SURGERY — IRRIGATION AND DEBRIDEMENT EXTREMITY
Anesthesia: General | Site: Leg Lower | Laterality: Right

## 2015-06-06 MED ORDER — DOXYCYCLINE HYCLATE 100 MG PO TABS
100.0000 mg | ORAL_TABLET | Freq: Two times a day (BID) | ORAL | Status: DC
  Administered 2015-06-06 – 2015-06-10 (×8): 100 mg via ORAL
  Filled 2015-06-06 (×8): qty 1

## 2015-06-06 MED ORDER — POTASSIUM CHLORIDE 10 MEQ/100ML IV SOLN
10.0000 meq | INTRAVENOUS | Status: AC
  Administered 2015-06-06 (×3): 10 meq via INTRAVENOUS
  Filled 2015-06-06: qty 100

## 2015-06-06 MED ORDER — LIDOCAINE HCL (PF) 1 % IJ SOLN
INTRAMUSCULAR | Status: AC
  Filled 2015-06-06: qty 10

## 2015-06-06 NOTE — Progress Notes (Signed)
CSW (Clinical Education officer, museum) called pt father to provide SNF bed offers. Pt father questioning why pt cannot dc home. CSW explained that it is pt/pt family choice and SNF is just a recommendation. CSW did go over reasons for recommendation and that pt is requiring assistance per PT notes. Pt father informed CSW he is aware of pt needs and was able to see pt move around in room yesterday. Pt father feels pt should be able to dc home but would like to continue to think about plan. CSW did provide bed offers. Should pt/pt family choose SNF at dc they would like [facility]. CSW notified facility. Facility is able to accept pt over weekend if medically stable for dc.   [place], [place]

## 2015-06-06 NOTE — Progress Notes (Signed)
CSW (Clinical Social Worker) left voicemail for pt father to notify of bed offers. Unable to leave a voicemail. Will attempt to contact later today.  [place], [place]

## 2015-06-06 NOTE — Progress Notes (Signed)
ANTIBIOTIC CONSULT NOTE - FOLLOW UP  Pharmacy Consult for Zosyn Indication: Cellulitis  Allergies  Allergen Reactions  . Lisinopril Swelling    Angioedema of the tongue    Patient Measurements: Height: 6' (182.9 cm) Weight: 281 lb 14.4 oz (127.869 kg) IBW/kg (Calculated) : 77.6 Adjusted Body Weight:    Vital Signs: Temp: 95.8 F (35.4 C) (08/05 0537) Temp Source: Axillary (08/05 0537) BP: 154/78 mmHg (08/05 0537) Pulse Rate: 53 (08/05 0537) Intake/Output from previous day: 08/04 0701 - 08/05 0700 In: 1940 [P.O.:240; I.V.:1700] Out: -  Intake/Output from this shift:    Labs:  Recent Labs  06/04/15 0440 06/04/15 1623 06/05/15 0328 06/06/15 0354  WBC 10.2 12.3* 9.6 10.5  HGB 14.4 15.6 13.5 13.6  PLT 293 325 291 298  CREATININE 2.08*  --  1.98* 2.10*   Estimated Creatinine Clearance: 48.5 mL/min (by C-G formula based on Cr of 2.1). No results for input(s): VANCOTROUGH, VANCOPEAK, VANCORANDOM, GENTTROUGH, GENTPEAK, GENTRANDOM, TOBRATROUGH, TOBRAPEAK, TOBRARND, AMIKACINPEAK, AMIKACINTROU, AMIKACIN in the last 72 hours.   Microbiology: Recent Results (from the past 720 hour(s))  Surgical pcr screen     Status: None   Collection Time: 06/06/15  5:42 AM  Result Value Ref Range Status   MRSA, PCR NEGATIVE NEGATIVE Final   Staphylococcus aureus NEGATIVE NEGATIVE Final    Comment:        The Xpert SA Assay (FDA approved for NASAL specimens in patients over 28 years of age), is one component of a comprehensive surveillance program.  Test performance has been validated by [facility] for patients greater than or equal to 48 year old. It is not intended to diagnose infection nor to guide or monitor treatment.     Anti-infectives    Start     Dose/Rate Route Frequency Ordered Stop   06/05/15 1100  ceFAZolin (ANCEF) 3 g in dextrose 5 % 50 mL IVPB  Status:  Discontinued     3 g 160 mL/hr over 30 Minutes Intravenous To ShortStay Surgical 06/04/15 2025 06/06/15  0751   05/31/15 2200  clindamycin (CLEOCIN) IVPB 600 mg     600 mg 100 mL/hr over 30 Minutes Intravenous 3 times per day 05/31/15 1133     05/28/15 1100  vancomycin (VANCOCIN) IVPB 1000 mg/200 mL premix  Status:  Discontinued     1,000 mg 200 mL/hr over 60 Minutes Intravenous Every 12 hours 05/27/15 2222 05/31/15 1132   05/28/15 1000  vancomycin (VANCOCIN) 1,500 mg in sodium chloride 0.9 % 500 mL IVPB  Status:  Discontinued     1,500 mg 250 mL/hr over 120 Minutes Intravenous Every 12 hours 05/28/15 0400 05/28/15 0406   05/28/15 0100  piperacillin-tazobactam (ZOSYN) IVPB 3.375 g     3.375 g 12.5 mL/hr over 240 Minutes Intravenous Every 8 hours 05/27/15 2222     05/27/15 2130  vancomycin (VANCOCIN) 1,500 mg in sodium chloride 0.9 % 500 mL IVPB     1,500 mg 250 mL/hr over 120 Minutes Intravenous  Once 05/27/15 2120 05/28/15 0138   05/27/15 1945  vancomycin (VANCOCIN) IVPB 1000 mg/200 mL premix     1,000 mg 200 mL/hr over 60 Minutes Intravenous  Once 05/27/15 1935 05/27/15 2154   05/27/15 1945  piperacillin-tazobactam (ZOSYN) IVPB 3.375 g     3.375 g 100 mL/hr over 30 Minutes Intravenous  Once 05/27/15 1935 05/27/15 2036      Assessment: ID:  Abx #10 for severe RLE cellulitis.  MRI confirms cellulitis, negative for osteo, possible abscess/hemorrhage -  Temp 95.8, WBC 10.5. No cx data.  Bleeding, required pressure dressing, (-) purulent fluid expressed today & will hold on I&D.No indication for surgical I/D at this time. Recommending maximization of medical management per surgery note.  Vanc 7/26 >>7/30 Clinda 7/30 >> Zosyn 7/26 >>  Goal of Therapy:  eradication of infection  Plan:  Zosyn 3.375g IV q8hr (dose ok down to CrCl 31ml/hr) Pharmacy will sign off. Please reconsult for further dosing assitance.   Eduardo Herman, PharmD, BCPS Clinical Staff Pharmacist Pager [phone]  Eduardo Herman 06/06/2015,8:25 AM

## 2015-06-06 NOTE — Progress Notes (Signed)
     Subjective:  R leg cellulitis. Patient reports pain as mild to moderate.  Patient's R leg without change.  Scant drainage.  No drainage can be expressed.  No fevers overnight.  WBC down from 12.3 on 8/3.  No indication for surgical I/D at this time.  Recommending maximization of medical management.  Continue abx per ID and daily dressing change.    Objective:   VITALS:   Filed Vitals:   06/05/15 0500 06/05/15 1255 06/05/15 2232 06/06/15 0537  BP:  149/82 139/69 154/78  Pulse: 60 86 53 53  Temp:  97.8 F (36.6 C) 98.5 F (36.9 C) 95.8 F (35.4 C)  TempSrc:  Oral Axillary Axillary  Resp:  18 18 18   Height:      Weight:      SpO2:  99% 97% 96%    ABD soft Sensation intact distally Intact pulses distally Dorsiflexion/Plantar flexion intact RLE: erythema and swelling. 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 No purulent drainage was able to be expressed at bedside today. Distally he has limited participation in neuro/motor exam. He has palpable pulses.  Lab Results  Component Value Date   WBC 10.5 06/06/2015   HGB 13.6 06/06/2015   HCT 41.8 06/06/2015   MCV 86.7 06/06/2015   PLT 298 06/06/2015   BMET    Component Value Date/Time   NA 138 06/06/2015 0354   K 3.2* 06/06/2015 0354   CL 102 06/06/2015 0354   CO2 27 06/06/2015 0354   GLUCOSE 110* 06/06/2015 0354   BUN 10 06/06/2015 0354   CREATININE 2.10* 06/06/2015 0354   CREATININE 0.99 11/28/2013 1536   CALCIUM 7.6* 06/06/2015 0354   GFRNONAA 31* 06/06/2015 0354   GFRNONAA 80 11/28/2013 1536   GFRAA 36* 06/06/2015 0354   GFRAA >89 11/28/2013 1536     Assessment/Plan: Day of Surgery   Active Problems:   Cellulitis of right leg   CKD (chronic kidney disease), stage III   AKI (acute kidney injury)   Up with therapy WBAT in the RLE No indication for surgical I/D at this time.  Recommending optimization of vascular status to RLE and continued abx per ID. Will have nursing change the dressings  daily. Will sign off at this time.  If the wound status changes or the patient shows signs of sepsis, please notify and we will reconsider surgical intervention at that time.   Suzannah Bettes Lelan Pons 06/06/2015, 7:05 AM Cell [phone]

## 2015-06-06 NOTE — Anesthesia Preprocedure Evaluation (Deleted)
Anesthesia Evaluation  Patient identified by MRN, date of birth, ID band Patient awake    Reviewed: Allergy & Precautions, NPO status , Patient's Chart, lab work & pertinent test results  Airway Mallampati: II   Neck ROM: full    Dental   Pulmonary shortness of breath, Current Smoker,  breath sounds clear to auscultation        Cardiovascular hypertension, + Peripheral Vascular Disease Rhythm:regular Rate:Normal     Neuro/Psych Down's Syndrome    GI/Hepatic   Endo/Other  Hypothyroidism Morbid obesity  Renal/GU Renal InsufficiencyRenal disease     Musculoskeletal   Abdominal   Peds  Hematology   Anesthesia Other Findings   Reproductive/Obstetrics                             Anesthesia Physical Anesthesia Plan  ASA: III  Anesthesia Plan: General   Post-op Pain Management:    Induction: Intravenous  Airway Management Planned: LMA  Additional Equipment:   Intra-op Plan:   Post-operative Plan:   Informed Consent: I have reviewed the patients History and Physical, chart, labs and discussed the procedure including the risks, benefits and alternatives for the proposed anesthesia with the patient or authorized representative who has indicated his/her understanding and acceptance.     Plan Discussed with: CRNA, Anesthesiologist and Surgeon  Anesthesia Plan Comments:         Anesthesia Quick Evaluation

## 2015-06-06 NOTE — Procedures (Signed)
Interventional Radiology Procedure Note  Procedure:  [place] eval of RLE reveals a pocket of highly complex fluid superior to the site of open drainage.  US guided aspiration yields <65mL bloody fluid.  Looks like old hematoma.  Sample sent for Cx to exclude superinfection.   Complications: NOne  Estimated Blood Loss: 0  Recommendations: - Cx pending  Signed,  Criselda Peaches, MD

## 2015-06-06 NOTE — Progress Notes (Signed)
[facility] PROGRESS NOTE  Eduardo Herman DZH:[id number] DOB: 08/06/1951 DOA: 05/27/2015 PCP: No primary care provider on file.  Assessment/Plan:  Right lower extremity cellulitis  venous Duplex negative for DVT.  -Has been on vanc/zosyn, Vanc changed to IV clinda due to AKI on 7/30 -has chronic venous stasis changes and previously followed at the wound center Father reported patient has been treated for this for the past several weeks at wound center. -will need several more days of IV Abx -continue current regimen and Po lasix, leg elevation -clinically improving, but 8/1 with large amount of purulent discharge when bullae probed by [facility] RN, MRI finally completed 8/2 after multiple attempts, shows extensive subcutaneous edema about the right lower leg is compatible with cellulitis. A focal subcutaneous collection in the anteromedial tissues approximately 4 cm below the medial tibial plateau, evaluated by Dr.Murphy, US guided aspiration recommended, IR couldn't reach father yesteday for consent. 8/3 evening had moderate active bleeding superiorly near a rutured bullus, required pressure and pressure dressing to finally stop this. 8/5- Orthopedics evaluated the patient in the OR, no indication for surgical incision and drainage.recommend continue antibiotics and daily dressing change.   Mental retardation Patient taken care of by family  AKI on CKD Unknown baseline, patient on Lasix. Today creatinine is 2.10  Hypokalemia Replace potassium and check BMP in a.m.  Hypertension Blood pressure now stable, started on atenolol, Cardizem.  Hypothyroidism Continue Synthroid   Code Status: Full code Family Communication:  No family at bedside Disposition Plan: SNF   Consultants:  Orthopedics  Procedures:  None  Antibiotics:  Clindamycin  Zosyn  HPI/Subjective: 64 year old male with history of mental retardation, borderline diabetes, renal insufficiency, chronic  venous stasis, previously cared for [facility] was admitted with severe right lower extremity cellulitis. He has been very slow to improve, his venous duplex was negative for DVT, initially on vancomycin and Zosyn subsequently vancomycin was changed to clindamycin due to acute kidney injury and to slow improvement and purulent drainage from wound probed by the nurse she underwent an MRI scan on 8/2 after multiple attempts , this shows an area of fluid collection subsequently orthopedics consultation was obtained, currently IR following for possible ultrasound-guided aspiration.  Patient was seen by ortho, no indication for surgical I&D. Patient denies pain this morning.  Objective: Filed Vitals:   06/06/15 1257  BP: 162/92  Pulse: 54  Temp:   Resp: 18    Intake/Output Summary (Last 24 hours) at 06/06/15 1607 Last data filed at 06/06/15 1424  Gross per 24 hour  Intake   2220 ml  Output      0 ml  Net   2220 ml   Filed Weights   05/27/15 2143  Weight: 127.869 kg (281 lb 14.4 oz)    Exam:   General:  Appears in no acute distress  Cardiovascular: S1s2 RRR  Respiratory: Clear bilaterally  Abdomen: Soft, nontender, no organomegaly  Musculoskeletal: *Right lower extremity erythematous and edematous with superficial ulceration  Data Reviewed: Basic Metabolic Panel:  Recent Labs Lab 06/02/15 0645 06/03/15 0014 06/03/15 0326 06/04/15 0440 06/05/15 0328 06/06/15 0354  NA 142 140  --  143 140 138  K 3.2* 3.4*  --  3.2* 3.2* 3.2*  CL 107 106  --  104 104 102  CO2 26 25  --  27 27 27   GLUCOSE 97 116*  --  89 107* 110*  BUN 17 17  --  14 13 10   CREATININE 1.94* 1.92* 1.96* 2.08*  1.98* 2.10*  CALCIUM 7.9* 7.7*  --  7.8* 7.6* 7.6*   Liver Function Tests: No results for input(s): AST, ALT, ALKPHOS, BILITOT, PROT, ALBUMIN in the last 168 hours. No results for input(s): LIPASE, AMYLASE in the last 168 hours. No results for input(s): AMMONIA in the last 168  hours. CBC:  Recent Labs Lab 06/02/15 0645 06/04/15 0440 06/04/15 1623 06/05/15 0328 06/06/15 0354  WBC 11.2* 10.2 12.3* 9.6 10.5  HGB 13.9 14.4 15.6 13.5 13.6  HCT 42.1 43.8 46.8 41.9 41.8  MCV 85.7 86.9 86.5 85.9 86.7  PLT 268 293 325 291 298   Cardiac Enzymes: No results for input(s): CKTOTAL, CKMB, CKMBINDEX, TROPONINI in the last 168 hours. BNP (last 3 results) No results for input(s): BNP in the last 8760 hours.  ProBNP (last 3 results) No results for input(s): PROBNP in the last 8760 hours.  CBG: No results for input(s): GLUCAP in the last 168 hours.  Recent Results (from the past 240 hour(s))  Surgical pcr screen     Status: None   Collection Time: 06/06/15  5:42 AM  Result Value Ref Range Status   MRSA, PCR NEGATIVE NEGATIVE Final   Staphylococcus aureus NEGATIVE NEGATIVE Final    Comment:        The Xpert SA Assay (FDA approved for NASAL specimens in patients over 19 years of age), is one component of a comprehensive surveillance program.  Test performance has been validated by [facility] for patients greater than or equal to 34 year old. It is not intended to diagnose infection nor to guide or monitor treatment.      Studies: No results found.  Scheduled Meds: . atenolol  100 mg Oral Daily  . clindamycin (CLEOCIN) IV  600 mg Intravenous 3 times per day  . diltiazem  360 mg Oral Daily  . furosemide  80 mg Oral Daily  . levothyroxine  200 mcg Oral Once per day on Mon Wed Fri  . levothyroxine  300 mcg Oral Once per day on Sun Tue Thu Sat  . lidocaine (PF)      . piperacillin-tazobactam (ZOSYN)  IV  3.375 g Intravenous Q8H  . potassium chloride  40 mEq Oral BID   Continuous Infusions:   Active Problems:   Cellulitis of right leg   CKD (chronic kidney disease), stage III   AKI (acute kidney injury)    Time spent: *20 min    [facility] Pager [phone]. If 7PM-7AM, please contact night-coverage at [email or web address],  password [facility] 06/06/2015, 4:07 PM  LOS: 10 days

## 2015-06-07 LAB — BASIC METABOLIC PANEL
Anion gap: 10 (ref 5–15)
BUN: 10 mg/dL (ref 6–20)
CALCIUM: 7.8 mg/dL — AB (ref 8.9–10.3)
CO2: 23 mmol/L (ref 22–32)
Chloride: 106 mmol/L (ref 101–111)
Creatinine, Ser: 2.22 mg/dL — ABNORMAL HIGH (ref 0.61–1.24)
GFR calc non Af Amer: 29 mL/min — ABNORMAL LOW (ref >60)
GFR, EST AFRICAN AMERICAN: 34 mL/min — AB (ref >60)
GLUCOSE: 93 mg/dL (ref 65–99)
Potassium: 3.6 mmol/L (ref 3.5–5.1)
Sodium: 139 mmol/L (ref 135–145)

## 2015-06-07 MED ORDER — SODIUM CHLORIDE 0.9 % IV SOLN
INTRAVENOUS | Status: DC
  Administered 2015-06-07 – 2015-06-10 (×5): via INTRAVENOUS

## 2015-06-07 NOTE — Care Management Note (Deleted)
Case Management Note  Patient Details  Name: Eduardo Herman MRN: [id number] Date of Birth: Apr 25, 1951  Subjective/Objective:                    Action/Plan:   Expected Discharge Date:                  Expected Discharge Plan:  Skilled Nursing Facility  In-House Referral:  Clinical Social Work  Discharge planning Services     Post Acute Care Choice:    Choice offered to:     DME Arranged:    DME Agency:     HH Arranged:    [place] Agency:     Status of Service:  In process, will continue to follow  Medicare Important Message Given:  Yes-fourth notification given Date Medicare IM Given:    Medicare IM give by:    Date Additional Medicare IM Given:    Additional Medicare Important Message give by:     If discussed at [place] of Stay Meetings, dates discussed:    Additional Comments:  Norina Buzzard, RN 06/07/2015, 2:50 PM

## 2015-06-07 NOTE — Care Management Note (Signed)
Case Management Note  Patient Details  Name: Eduardo Herman MRN: [id number] Date of Birth: 02/12/1951  Subjective/Objective:                    Action/Plan:   Expected Discharge Date:                  Expected Discharge Plan:  Skilled Nursing Facility  In-House Referral:  Clinical Social Work  Discharge planning Services     Post Acute Care Choice:    Choice offered to:     DME Arranged:    DME Agency:     HH Arranged:    [place] Agency:     Status of Service:  In process, will continue to follow  Medicare Important Message Given:  Yes-fourth notification given Date Medicare IM Given:   06/07/2015 Medicare IM give by:   Frann Rider, RN, BSN Date Additional Medicare IM Given:    Additional Medicare Important Message give by:     If discussed at [place] of Stay Meetings, dates discussed:    Additional Comments:  Norina Buzzard, RN 06/07/2015, 2:49 PM

## 2015-06-07 NOTE — Progress Notes (Signed)
[facility] PROGRESS NOTE  Milam Allbaugh Radice UTM:[id number] DOB: [birth year] DOA: 05/27/2015 PCP: No primary care provider on file.  Assessment/Plan:  Right lower extremity cellulitis  venous Duplex negative for DVT.  -Has been on vanc/zosyn, Vanc changed to IV clinda due to AKI on 7/30 -has chronic venous stasis changes and previously followed at the wound center Father reported patient has been treated for this for the past several weeks at wound center. -will need several more days of IV Abx -continue current regimen and Po lasix, leg elevation -clinically improving, but 8/1 with large amount of purulent discharge when bullae probed by [facility] RN, MRI finally completed 8/2 after multiple attempts, shows extensive subcutaneous edema about the right lower leg is compatible with cellulitis. A focal subcutaneous collection in the anteromedial tissues approximately 4 cm below the medial tibial plateau, evaluated by Dr.Murphy, US guided aspiration recommended, IR couldn't reach father yesteday for consent. 8/3 evening had moderate active bleeding superiorly near a rutured bullus, required pressure and pressure dressing to finally stop this. 8/5- Orthopedics evaluated the patient in the OR, no indication for surgical incision and drainage.recommend continue antibiotics and daily dressing change.   Mental retardation Patient taken care of by family  AKI on CKD From diuresis Renal function is getting worse, patient on Lasix 80 mg daily Will hold the Lasix and start gentle IV hydration Check BMP in a.m.  Hypokalemia Replace potassium and check BMP in a.m.  Hypertension Blood pressure now stable, started on atenolol, Cardizem.  Hypothyroidism Continue Synthroid   Code Status: Full code Family Communication:  No family at bedside Disposition Plan: SNF   Consultants:  Orthopedics  Procedures:  None  Antibiotics:  Clindamycin  Zosyn  HPI/Subjective: 64 year old male with  history of mental retardation, borderline diabetes, renal insufficiency, chronic venous stasis, previously cared for [facility] was admitted with severe right lower extremity cellulitis. He has been very slow to improve, his venous duplex was negative for DVT, initially on vancomycin and Zosyn subsequently vancomycin was changed to clindamycin due to acute kidney injury and to slow improvement and purulent drainage from wound probed by the nurse she underwent an MRI scan on 8/2 after multiple attempts , this shows an area of fluid collection subsequently orthopedics consultation was obtained, currently IR following for possible ultrasound-guided aspiration.  Patient denies any pain, no nausea vomiting.  Objective: Filed Vitals:   06/07/15 1400  BP: 136/71  Pulse: 58  Temp: 97.5 F (36.4 C)  Resp: 18    Intake/Output Summary (Last 24 hours) at 06/07/15 1539 Last data filed at 06/07/15 1403  Gross per 24 hour  Intake   1430 ml  Output      0 ml  Net   1430 ml   Filed Weights   05/27/15 2143  Weight: 127.869 kg (281 lb 14.4 oz)    Exam:   General:  Appears in no acute distress  Cardiovascular: S1s2 RRR  Respiratory: Clear bilaterally  Abdomen: Soft, nontender, no organomegaly  Musculoskeletal: *Right lower extremity swelling has improved, dried superficial ulceration noted  Data Reviewed: Basic Metabolic Panel:  Recent Labs Lab 06/03/15 0014 06/03/15 0326 06/04/15 0440 06/05/15 0328 06/06/15 0354 06/07/15 0323  NA 140  --  143 140 138 139  K 3.4*  --  3.2* 3.2* 3.2* 3.6  CL 106  --  104 104 102 106  CO2 25  --  27 27 27 23   GLUCOSE 116*  --  89 107* 110* 93  BUN 17  --  14 13 10 10   CREATININE 1.92* 1.96* 2.08* 1.98* 2.10* 2.22*  CALCIUM 7.7*  --  7.8* 7.6* 7.6* 7.8*   Liver Function Tests: No results for input(s): AST, ALT, ALKPHOS, BILITOT, PROT, ALBUMIN in the last 168 hours. No results for input(s): LIPASE, AMYLASE in the last 168 hours. No results  for input(s): AMMONIA in the last 168 hours. CBC:  Recent Labs Lab 06/02/15 0645 06/04/15 0440 06/04/15 1623 06/05/15 0328 06/06/15 0354  WBC 11.2* 10.2 12.3* 9.6 10.5  HGB 13.9 14.4 15.6 13.5 13.6  HCT 42.1 43.8 46.8 41.9 41.8  MCV 85.7 86.9 86.5 85.9 86.7  PLT 268 293 325 291 298   Cardiac Enzymes: No results for input(s): CKTOTAL, CKMB, CKMBINDEX, TROPONINI in the last 168 hours. BNP (last 3 results) No results for input(s): BNP in the last 8760 hours.  ProBNP (last 3 results) No results for input(s): PROBNP in the last 8760 hours.  CBG: No results for input(s): GLUCAP in the last 168 hours.  Recent Results (from the past 240 hour(s))  Surgical pcr screen     Status: None   Collection Time: 06/06/15  5:42 AM  Result Value Ref Range Status   MRSA, PCR NEGATIVE NEGATIVE Final   Staphylococcus aureus NEGATIVE NEGATIVE Final    Comment:        The Xpert SA Assay (FDA approved for NASAL specimens in patients over 66 years of age), is one component of a comprehensive surveillance program.  Test performance has been validated by [facility] for patients greater than or equal to 81 year old. It is not intended to diagnose infection nor to guide or monitor treatment.   Culture, routine-abscess     Status: None (Preliminary result)   Collection Time: 06/06/15  2:39 PM  Result Value Ref Range Status   Specimen Description ABSCESS RIGHT LEG  Final   Special Requests NONE  Final   Gram Stain   Final    ABUNDANT WBC PRESENT,BOTH PMN AND MONONUCLEAR NO SQUAMOUS EPITHELIAL CELLS SEEN NO ORGANISMS SEEN Performed at [facility]    Culture   Final    NO GROWTH 1 DAY Performed at [facility]    Report Status PENDING  Incomplete     Studies: No results found.  Scheduled Meds: . atenolol  100 mg Oral Daily  . diltiazem  360 mg Oral Daily  . doxycycline  100 mg Oral Q12H  . furosemide  80 mg Oral Daily  . levothyroxine  200 mcg Oral Once per day  on Mon Wed Fri  . levothyroxine  300 mcg Oral Once per day on Sun Tue Thu Sat  . piperacillin-tazobactam (ZOSYN)  IV  3.375 g Intravenous Q8H  . potassium chloride  40 mEq Oral BID   Continuous Infusions:   Active Problems:   Cellulitis of right leg   CKD (chronic kidney disease), stage III   AKI (acute kidney injury)    Time spent: *20 min    [place] [facility] Pager [phone]. If 7PM-7AM, please contact night-coverage at [email or web address], password [facility] 06/07/2015, 3:39 PM  LOS: 11 days

## 2015-06-08 LAB — BASIC METABOLIC PANEL
Anion gap: 8 (ref 5–15)
BUN: 12 mg/dL (ref 6–20)
CALCIUM: 8 mg/dL — AB (ref 8.9–10.3)
CHLORIDE: 106 mmol/L (ref 101–111)
CO2: 27 mmol/L (ref 22–32)
CREATININE: 2.14 mg/dL — AB (ref 0.61–1.24)
GFR calc non Af Amer: 31 mL/min — ABNORMAL LOW (ref >60)
GFR, EST AFRICAN AMERICAN: 36 mL/min — AB (ref >60)
GLUCOSE: 103 mg/dL — AB (ref 65–99)
Potassium: 3.6 mmol/L (ref 3.5–5.1)
SODIUM: 141 mmol/L (ref 135–145)

## 2015-06-08 NOTE — Progress Notes (Signed)
[facility] PROGRESS NOTE  Clebert Wenger Rocca HCW:[id number] DOB: 26-Jan-1951 DOA: 05/27/2015 PCP: No primary care provider on file.  Assessment/Plan:  Right lower extremity cellulitis  venous Duplex negative for DVT.  -Has been on vanc/zosyn, Vanc changed to IV clinda due to AKI on 7/30 -has chronic venous stasis changes and previously followed at the wound center Father reported patient has been treated for this for the past several weeks at wound center. -will need several more days of IV Abx -continue current regimen and Po lasix, leg elevation -clinically improving, but 8/1 with large amount of purulent discharge when bullae probed by [facility] RN, MRI finally completed 8/2 after multiple attempts, shows extensive subcutaneous edema about the right lower leg is compatible with cellulitis. A focal subcutaneous collection in the anteromedial tissues approximately 4 cm below the medial tibial plateau, evaluated by Dr.Murphy, US guided aspiration recommended, IR couldn't reach father yesteday for consent. 8/3 evening had moderate active bleeding superiorly near a rutured bullus, required pressure and pressure dressing to finally stop this. 8/5- Orthopedics evaluated the patient in the OR, no indication for surgical incision and drainage.recommend continue antibiotics and daily dressing change.   Mental retardation Patient taken care of by family  AKI on CKD From diuresis Slowly improving, today the creatinine is 2.14 Renal function was getting  worse, on Lasix 80 mg daily Will hold the Lasix and start gentle IV hydration Check BMP in a.m.  Hypokalemia Replace potassium and check BMP in a.m.  Hypertension Blood pressure now stable, started on atenolol, Cardizem.  Hypothyroidism Continue Synthroid   Code Status: Full code Family Communication:  No family at bedside, tried to call father on phone, unable to leave message Disposition Plan: Likely home     Consultants:  Orthopedics  Procedures:  None  Antibiotics:  Clindamycin  Zosyn  HPI/Subjective: 64 year old male with history of mental retardation, borderline diabetes, renal insufficiency, chronic venous stasis, previously cared for [facility] was admitted with severe right lower extremity cellulitis. He has been very slow to improve, his venous duplex was negative for DVT, initially on vancomycin and Zosyn subsequently vancomycin was changed to clindamycin due to acute kidney injury and to slow improvement and purulent drainage from wound probed by the nurse she underwent an MRI scan on 8/2 after multiple attempts , this shows an area of fluid collection subsequently orthopedics consultation was obtained, currently IR following for possible ultrasound-guided aspiration.  Patient denies pain, sitting comfortably.  Objective: Filed Vitals:   06/08/15 1510  BP: 153/76  Pulse: 51  Temp: 97.5 F (36.4 C)  Resp: 18    Intake/Output Summary (Last 24 hours) at 06/08/15 1526 Last data filed at 06/08/15 1000  Gross per 24 hour  Intake 1947.5 ml  Output      0 ml  Net 1947.5 ml   Filed Weights   05/27/15 2143  Weight: 127.869 kg (281 lb 14.4 oz)    Exam:   General:  Appears in no acute distress  Cardiovascular: S1s2 RRR  Respiratory: Clear bilaterally  Abdomen: Soft, nontender, no organomegaly  Musculoskeletal: *Right lower extremity swelling has improved, dried superficial ulceration noted  Data Reviewed: Basic Metabolic Panel:  Recent Labs Lab 06/04/15 0440 06/05/15 0328 06/06/15 0354 06/07/15 0323 06/08/15 0525  NA 143 140 138 139 141  K 3.2* 3.2* 3.2* 3.6 3.6  CL 104 104 102 106 106  CO2 27 27 27 23 27   GLUCOSE 89 107* 110* 93 103*  BUN 14 13 10 10  12  CREATININE 2.08* 1.98* 2.10* 2.22* 2.14*  CALCIUM 7.8* 7.6* 7.6* 7.8* 8.0*   Liver Function Tests: No results for input(s): AST, ALT, ALKPHOS, BILITOT, PROT, ALBUMIN in the last 168  hours. No results for input(s): LIPASE, AMYLASE in the last 168 hours. No results for input(s): AMMONIA in the last 168 hours. CBC:  Recent Labs Lab 06/02/15 0645 06/04/15 0440 06/04/15 1623 06/05/15 0328 06/06/15 0354  WBC 11.2* 10.2 12.3* 9.6 10.5  HGB 13.9 14.4 15.6 13.5 13.6  HCT 42.1 43.8 46.8 41.9 41.8  MCV 85.7 86.9 86.5 85.9 86.7  PLT 268 293 325 291 298   Cardiac Enzymes: No results for input(s): CKTOTAL, CKMB, CKMBINDEX, TROPONINI in the last 168 hours. BNP (last 3 results) No results for input(s): BNP in the last 8760 hours.  ProBNP (last 3 results) No results for input(s): PROBNP in the last 8760 hours.  CBG: No results for input(s): GLUCAP in the last 168 hours.  Recent Results (from the past 240 hour(s))  Surgical pcr screen     Status: None   Collection Time: 06/06/15  5:42 AM  Result Value Ref Range Status   MRSA, PCR NEGATIVE NEGATIVE Final   Staphylococcus aureus NEGATIVE NEGATIVE Final    Comment:        The Xpert SA Assay (FDA approved for NASAL specimens in patients over 36 years of age), is one component of a comprehensive surveillance program.  Test performance has been validated by [facility] for patients greater than or equal to 75 year old. It is not intended to diagnose infection nor to guide or monitor treatment.   Culture, routine-abscess     Status: None (Preliminary result)   Collection Time: 06/06/15  2:39 PM  Result Value Ref Range Status   Specimen Description ABSCESS RIGHT LEG  Final   Special Requests NONE  Final   Gram Stain   Final    ABUNDANT WBC PRESENT,BOTH PMN AND MONONUCLEAR NO SQUAMOUS EPITHELIAL CELLS SEEN NO ORGANISMS SEEN Performed at [facility]    Culture   Final    NO GROWTH 2 DAYS Performed at [facility]    Report Status PENDING  Incomplete     Studies: No results found.  Scheduled Meds: . atenolol  100 mg Oral Daily  . diltiazem  360 mg Oral Daily  . doxycycline  100 mg Oral  Q12H  . levothyroxine  200 mcg Oral Once per day on Mon Wed Fri  . levothyroxine  300 mcg Oral Once per day on Sun Tue Thu Sat  . piperacillin-tazobactam (ZOSYN)  IV  3.375 g Intravenous Q8H  . potassium chloride  40 mEq Oral BID   Continuous Infusions: . sodium chloride 75 mL/hr at 06/08/15 8786    Active Problems:   Cellulitis of right leg   CKD (chronic kidney disease), stage III   AKI (acute kidney injury)    Time spent: *20 min    [place] [facility] Pager [phone]. If 7PM-7AM, please contact night-coverage at [email or web address], password [facility] 06/08/2015, 3:26 PM  LOS: 12 days

## 2015-06-09 DIAGNOSIS — E876 Hypokalemia: Secondary | ICD-10-CM

## 2015-06-09 DIAGNOSIS — N179 Acute kidney failure, unspecified: Secondary | ICD-10-CM

## 2015-06-09 DIAGNOSIS — L03115 Cellulitis of right lower limb: Secondary | ICD-10-CM

## 2015-06-09 DIAGNOSIS — N183 Chronic kidney disease, stage 3 (moderate): Secondary | ICD-10-CM

## 2015-06-09 LAB — BASIC METABOLIC PANEL
ANION GAP: 11 (ref 5–15)
BUN: 13 mg/dL (ref 6–20)
CO2: 24 mmol/L (ref 22–32)
Calcium: 8.1 mg/dL — ABNORMAL LOW (ref 8.9–10.3)
Chloride: 106 mmol/L (ref 101–111)
Creatinine, Ser: 1.95 mg/dL — ABNORMAL HIGH (ref 0.61–1.24)
GFR calc Af Amer: 40 mL/min — ABNORMAL LOW (ref >60)
GFR calc non Af Amer: 34 mL/min — ABNORMAL LOW (ref >60)
Glucose, Bld: 96 mg/dL (ref 65–99)
Potassium: 3.7 mmol/L (ref 3.5–5.1)
Sodium: 141 mmol/L (ref 135–145)

## 2015-06-09 NOTE — Care Management Important Message (Signed)
Important Message  Patient Details  Name: Eduardo Herman MRN: [id number] Date of Birth: [birth year]   Medicare Important Message Given:  Yes-fourth notification given    Delorse Lek 06/09/2015, 3:52 PM

## 2015-06-09 NOTE — Clinical Social Work Note (Signed)
CSW spoke with patient and his father regarding SNF placement for short term rehab and iv antibiotics.  Patient's father is requesting home health instead of going to a SNF for short term rehab.  Patient's dad asked about [place] for home health needs.  CSW notified case manager that family is requesting home health instead of SNF.  Case manager was made aware and will follow up with patient tomorrow.  CSW to continue to follow patient's progress in case the family decides they would like SNF placement.  Jones Broom. [place], MSW, [place] 06/09/2015 5:48 PM

## 2015-06-09 NOTE — Progress Notes (Signed)
Physical Therapy Treatment Patient Details Name: Eduardo Herman MRN: [id number] DOB: 1951/09/03 Today's Date: 06/09/2015    History of Present Illness 64 yo male with CP and MR was admitted for drainage RLE wound with I&D consult, referred to PT to restore his mobility as able.    PT Comments    Pt progressing with mobility, ambulated 20' with min A, +2 for safety to keep chair close as pt wants to sit as soon as he begins to fatigue. Continues to need Mod A +2 to rise from chair. PT will continue to follow.   Follow Up Recommendations  SNF;Supervision/Assistance - 24 hour     Equipment Recommendations  Rolling walker with 5" wheels    Recommendations for Other Services       Precautions / Restrictions Precautions Precautions: Fall Restrictions Weight Bearing Restrictions: No    Mobility  Bed Mobility               General bed mobility comments: received in chair  Transfers Overall transfer level: Needs assistance Equipment used: Rolling walker (2 wheeled) Transfers: Sit to/from Stand Sit to Stand: Mod assist;+2 physical assistance         General transfer comment: pt still requring mod A +2 to rise from recliner. Able to stand from toilet with heavy use of grab bar and only min A. Pt incontinent upon standing while heading to bathroom, reports repaeatedly that he wears diapers at home and needs them here,  Ambulation/Gait Ambulation/Gait assistance: Min assist;+2 safety/equipment Ambulation Distance (Feet): 74 Feet Assistive device: Rolling walker (2 wheeled) Gait Pattern/deviations: Step-through pattern;Trunk flexed Gait velocity: Decreased   General Gait Details: +2 for safety to bring chair behind, but pt able to ambulate with min A. Multiple cues for erect posture and pt has difficulty navigating around obstacles.    Stairs            Wheelchair Mobility    Modified Rankin (Stroke Patients Only)       Balance Overall balance assessment:  Needs assistance Sitting-balance support: Feet supported Sitting balance-Leahy Scale: Good     Standing balance support: Single extremity supported Standing balance-Leahy Scale: Poor Standing balance comment: requires UE support for safe standing                    Cognition Arousal/Alertness: Awake/alert Behavior During Therapy: Restless;Impulsive Overall Cognitive Status: History of cognitive impairments - at baseline       Memory: Decreased short-term memory              Exercises      General Comments General comments (skin integrity, edema, etc.): pt's father questioning need for pt to go to SNF. Continue to recommend this as safest dispo plan but will increase freq to 3x/ week in case family refuses, in which case, recommend home with HHPT. Pt still requiring mod A for sit to stand and not confident that family can handle this.       Pertinent Vitals/Pain Pain Assessment: No/denies pain    Home Living                      Prior Function            PT Goals (current goals can now be found in the care plan section) Acute Rehab PT Goals Patient Stated Goal: go to bathroom PT Goal Formulation: With patient Time For Goal Achievement: 06/16/15 Potential to Achieve Goals: Good Progress towards PT goals: Progressing  toward goals    Frequency  Min 3X/week    PT Plan Current plan remains appropriate;Frequency needs to be updated    Co-evaluation             End of Session Equipment Utilized During Treatment: Gait belt Activity Tolerance: Patient tolerated treatment well Patient left: in chair;with call bell/phone within reach     Time: 1045-1108 PT Time Calculation (min) (ACUTE ONLY): 23 min  Charges:  $Gait Training: 8-22 mins $Therapeutic Activity: 8-22 mins                    G Codes:     Leighton Roach, PT  Acute Rehab Services  [phone]  Leighton Roach 06/09/2015, 12:18 PM

## 2015-06-09 NOTE — Progress Notes (Signed)
[facility] PROGRESS NOTE  Srikar Chiang Tinner BUL:[id number] DOB: [birth year] DOA: 05/27/2015 PCP: No primary care provider on file.  Assessment/Plan:  Right lower extremity cellulitis  venous Duplex negative for DVT.  -Has been on vanc/zosyn, Vanc changed to IV clinda due to AKI on 7/30 -has chronic venous stasis changes and previously followed at the wound center Father reported patient has been treated for this for the past several weeks at wound center. -will need several more days of IV Abx -continue current regimen and, leg elevation -clinically improving, but 8/1 with large amount of purulent discharge when bullae probed by [facility] RN, MRI finally completed 8/2 after multiple attempts, shows extensive subcutaneous edema about the right lower leg is compatible with cellulitis. A focal subcutaneous collection in the anteromedial tissues approximately 4 cm below the medial tibial plateau, evaluated by Dr.Murphy, US guided aspiration recommended, IR on 06/06/2015 was only able to aspirate small volume of bloody fluid. Was sent for cultures with no growth to date. 8/3 evening had moderate active bleeding superiorly near a rutured bullus, required pressure and pressure dressing to finally stop this. 8/5- Orthopedics evaluated the patient in the OR, no indication for surgical incision and drainage.recommend continue antibiotics and daily dressing changes.   Mental retardation Patient taken care of by family  AKI on CKD From diuresis Slowly improving, today the creatinine is 1.95 from 2.14 Renal function was getting  worse, on Lasix 80 mg daily Continue to hold Lasix.  Check BMP in a.m.  Hypokalemia Repleted.  Hypertension Blood pressure now stable. Continue atenolol andCardizem.  Hypothyroidism Continue Synthroid   Code Status: Full code Family Communication:  Updated patient and father at bedside.  Disposition Plan: Likely home with home health   Consultants:  Orthopedics:  Dr. Percell Miller 06/04/2015  Wound care: Julien Girt 06/02/2015  Procedures:  Ultrasound-guided aspiration 06/06/2015 Dr. Geroge Baseman  MRI tib-fib 06/03/2015  CT head 05/27/2015  Right lower extremity Doppler 05/28/2015  Antibiotics:  IV Clindamycin 05/31/2015>>>> 06/06/2015  Oral doxycycline 06/06/2015  IV Zosyn 05/27/2015>>>>  HPI/Subjective: 64 year old male with history of mental retardation, borderline diabetes, renal insufficiency, chronic venous stasis, previously cared for [facility] was admitted with severe right lower extremity cellulitis. He has been very slow to improve, his venous duplex was negative for DVT, initially on vancomycin and Zosyn subsequently vancomycin was changed to clindamycin due to acute kidney injury and to slow improvement and purulent drainage from wound probed by the nurse she underwent an MRI scan on 8/2 after multiple attempts , this shows an area of fluid collection subsequently orthopedics consultation was obtained, currently IR following for possible ultrasound-guided aspiration.     Patient sitting in chair. No complaints.  Objective: Filed Vitals:   06/09/15 1412  BP: 137/70  Pulse: 56  Temp: 97.5 F (36.4 C)  Resp: 16    Intake/Output Summary (Last 24 hours) at 06/09/15 1805 Last data filed at 06/09/15 1613  Gross per 24 hour  Intake   3180 ml  Output      0 ml  Net   3180 ml   Filed Weights   05/27/15 2143  Weight: 127.869 kg (281 lb 14.4 oz)    Exam:   General:  NAD  Cardiovascular: S1s2 RRR  Respiratory: Clear bilaterally  Abdomen: Soft, nontender, no organomegaly  Musculoskeletal: Right lower extremity in bandage.  Data Reviewed: Basic Metabolic Panel:  Recent Labs Lab 06/05/15 0328 06/06/15 0354 06/07/15 0323 06/08/15 0525 06/09/15 0426  NA 140 138 139 141 141  K 3.2* 3.2* 3.6 3.6 3.7  CL 104 102 106 106 106  CO2 27 27 23 27 24   GLUCOSE 107* 110* 93 103* 96  BUN 13 10 10 12 13   CREATININE  1.98* 2.10* 2.22* 2.14* 1.95*  CALCIUM 7.6* 7.6* 7.8* 8.0* 8.1*   Liver Function Tests: No results for input(s): AST, ALT, ALKPHOS, BILITOT, PROT, ALBUMIN in the last 168 hours. No results for input(s): LIPASE, AMYLASE in the last 168 hours. No results for input(s): AMMONIA in the last 168 hours. CBC:  Recent Labs Lab 06/04/15 0440 06/04/15 1623 06/05/15 0328 06/06/15 0354  WBC 10.2 12.3* 9.6 10.5  HGB 14.4 15.6 13.5 13.6  HCT 43.8 46.8 41.9 41.8  MCV 86.9 86.5 85.9 86.7  PLT 293 325 291 298   Cardiac Enzymes: No results for input(s): CKTOTAL, CKMB, CKMBINDEX, TROPONINI in the last 168 hours. BNP (last 3 results) No results for input(s): BNP in the last 8760 hours.  ProBNP (last 3 results) No results for input(s): PROBNP in the last 8760 hours.  CBG: No results for input(s): GLUCAP in the last 168 hours.  Recent Results (from the past 240 hour(s))  Surgical pcr screen     Status: None   Collection Time: 06/06/15  5:42 AM  Result Value Ref Range Status   MRSA, PCR NEGATIVE NEGATIVE Final   Staphylococcus aureus NEGATIVE NEGATIVE Final    Comment:        The Xpert SA Assay (FDA approved for NASAL specimens in patients over 28 years of age), is one component of a comprehensive surveillance program.  Test performance has been validated by [facility] for patients greater than or equal to 66 year old. It is not intended to diagnose infection nor to guide or monitor treatment.   Culture, routine-abscess     Status: None (Preliminary result)   Collection Time: 06/06/15  2:39 PM  Result Value Ref Range Status   Specimen Description ABSCESS RIGHT LEG  Final   Special Requests NONE  Final   Gram Stain   Final    ABUNDANT WBC PRESENT,BOTH PMN AND MONONUCLEAR NO SQUAMOUS EPITHELIAL CELLS SEEN NO ORGANISMS SEEN Performed at [facility]    Culture   Final    NO GROWTH 3 DAYS Performed at [facility]    Report Status PENDING  Incomplete      Studies: No results found.  Scheduled Meds: . atenolol  100 mg Oral Daily  . diltiazem  360 mg Oral Daily  . doxycycline  100 mg Oral Q12H  . levothyroxine  200 mcg Oral Once per day on Mon Wed Fri  . levothyroxine  300 mcg Oral Once per day on Sun Tue Thu Sat  . piperacillin-tazobactam (ZOSYN)  IV  3.375 g Intravenous Q8H  . potassium chloride  40 mEq Oral BID   Continuous Infusions: . sodium chloride 75 mL/hr at 06/09/15 2671    Active Problems:   Cellulitis of right leg   CKD (chronic kidney disease), stage III   AKI (acute kidney injury)    Time spent: 52 min    [facility] MD [facility] Pager [phone]. If 7PM-7AM, please contact night-coverage at [email or web address], password [facility] 06/09/2015, 6:05 PM  LOS: 13 days

## 2015-06-10 DIAGNOSIS — E039 Hypothyroidism, unspecified: Secondary | ICD-10-CM | POA: Diagnosis present

## 2015-06-10 LAB — BASIC METABOLIC PANEL
Anion gap: 6 (ref 5–15)
BUN: 12 mg/dL (ref 6–20)
CALCIUM: 8.2 mg/dL — AB (ref 8.9–10.3)
CO2: 25 mmol/L (ref 22–32)
Chloride: 109 mmol/L (ref 101–111)
Creatinine, Ser: 1.79 mg/dL — ABNORMAL HIGH (ref 0.61–1.24)
GFR calc Af Amer: 44 mL/min — ABNORMAL LOW (ref >60)
GFR calc non Af Amer: 38 mL/min — ABNORMAL LOW (ref >60)
GLUCOSE: 99 mg/dL (ref 65–99)
Potassium: 3.8 mmol/L (ref 3.5–5.1)
SODIUM: 140 mmol/L (ref 135–145)

## 2015-06-10 LAB — CBC
HCT: 42.8 % (ref 39.0–52.0)
Hemoglobin: 13.7 g/dL (ref 13.0–17.0)
MCH: 28.1 pg (ref 26.0–34.0)
MCHC: 32 g/dL (ref 30.0–36.0)
MCV: 87.7 fL (ref 78.0–100.0)
PLATELETS: 284 10*3/uL (ref 150–400)
RBC: 4.88 MIL/uL (ref 4.22–5.81)
RDW: 16.9 % — AB (ref 11.5–15.5)
WBC: 11 10*3/uL — AB (ref 4.0–10.5)

## 2015-06-10 LAB — CULTURE, ROUTINE-ABSCESS: Culture: NO GROWTH

## 2015-06-10 MED ORDER — FUROSEMIDE 80 MG PO TABS: 40.0000 mg | ORAL_TABLET | Freq: Every day | ORAL | Status: AC

## 2015-06-10 MED ORDER — DILTIAZEM HCL ER COATED BEADS 360 MG PO CP24: 360.0000 mg | ORAL_CAPSULE | Freq: Every day | ORAL | Status: DC

## 2015-06-10 NOTE — Progress Notes (Signed)
Pt discharged to home.  Discharge instructions explained to father in front of pt.  Father has no questions at the time of discharge.  Father states pt has no belongings.  IV removed.  Nurse tech wheeled pt off the floor.

## 2015-06-10 NOTE — Discharge Summary (Signed)
Physician Discharge Summary  Eduardo Herman CNO:[id number] DOB: [birth year] DOA: 05/27/2015  PCP: No primary care provider on file.  Admit date: 05/27/2015 Discharge date: 06/10/2015  Time spent: 65 minutes  Recommendations for Outpatient Follow-up:  1. Patient is to follow-up with the wound care clinic. 2. Patient is to follow-up with PCP in 1 week.On follow up Patient will need basic metabolic profile done to follow-up on electrolytes and renal function. Patient cellulitis and the to be reassessed at that time.  Discharge Diagnoses:  Principal Problem:   Cellulitis of right leg Active Problems:   Mental retardation   CKD (chronic kidney disease), stage III   AKI (acute kidney injury)   Hypokalemia   Hypothyroidism   Discharge Condition: Stable and improved  Diet recommendation: Heart healthy diet  Filed Weights   05/27/15 2143  Weight: 127.869 kg (281 lb 14.4 oz)    History of present illness:  Per Dr Raeanne Gathers Eduardo Herman is a 64 y.o. male with below past medical history who is brought by his father and sister to the emergency department due to right lower extremity swelling, redness, tenderness and discharge of purulent secretions. Per relatives, the patient has been going to the wound clinic for over a month and was discharged last Wednesday, but since then they started noticing progressive worsening of his right lower extremity, particularly in the last 2 days prior to admission. Apparently the patient hasn'Eduardo had a fever at home. He is unable to add further information due to his mental status. He looks in no acute distress.  Hospital Course:  Right lower extremity cellulitis Patient had presented with a worsening right lower extremity cellulitis. Patient was admitted and placed empirically on IV antibiotics. venous Duplex negative for DVT.  -Has been on vanc/zosyn, Vanc changed to IV clinda due to AKI on 7/30 -has chronic venous stasis changes and previously followed at the  wound center Father reported patient has been treated for this for the past several weeks at wound center. -patient received approximately 2 weeks of empiric IV antibiotics with significant clinical improvement. Patient was also seen by wound care nurse and had daily dressing changes.  -On  8/1  patient with large amount of purulent discharge when bullae probed by [facility] RN, MRI finally completed 8/2 after multiple attempts, showed extensive subcutaneous edema about the right lower leg compatible with cellulitis. A focal subcutaneous collection in the anteromedial tissues approximately 4 cm below the medial tibial plateau, evaluated by Dr.Murphy, US guided aspiration recommended, IR on 06/06/2015 was only able to aspirate small volume of bloody fluid. Was sent for cultures with no growth to date. 8/3 evening had moderate active bleeding superiorly near a rutured bullus, required pressure and pressure dressing to finally stop this. 8/5- Orthopedics evaluated the patient in the OR, no indication for surgical incision and drainage. Recommended contining antibiotics and daily dressing changes.  Patient received a total of 2 weeks of IV antibiotics with clinical improvement. Patient be discharged home on daily dressing changes with home health RN is to follow-up with the wound care clinic.  Mental retardation Patient taken care of by family  AKI on CKD From diuresis Slowly improving, daily such that on day of discharge patient's creatinine was down to 1.79 from 2.22. Patient's diuretics were held during the hospitalization and will be resumed 2-3 days post discharge. Outpatient follow-up.  Hypokalemia Repleted.  Hypertension Continued on atenolol andCardizem.  Hypothyroidism Continued on home regimen Synthroid    Procedures:  Ultrasound-guided aspiration  06/06/2015 Dr. Geroge Baseman  MRI tib-fib 06/03/2015  CT head 05/27/2015  Right lower extremity Doppler  05/28/2015    Consultations:  Orthopedics: Dr. Percell Miller 06/04/2015  Wound care: Julien Girt 06/02/2015    Discharge Exam: Filed Vitals:   06/10/15 1520  BP: 136/86  Pulse: 67  Temp: 98.8 F (37.1 C)  Resp: 20    General: NAD Cardiovascular: RRR Respiratory: CTAB  Discharge Instructions   Discharge Instructions    Diet - low sodium heart healthy    Complete by:  As directed      Discharge instructions    Complete by:  As directed   Follow up with PCP in 1 week. Follow up at wound care clinic     Increase activity slowly    Complete by:  As directed           Discharge Medication List as of 06/10/2015  6:01 PM    CONTINUE these medications which have CHANGED   Details  diltiazem (CARDIZEM CD) 360 MG 24 hr capsule Take 1 capsule (360 mg total) by mouth daily., Starting 06/10/2015, Until Discontinued, Print    furosemide (LASIX) 80 MG tablet Take 0.5 tablets (40 mg total) by mouth daily. Resume in 2 days, Starting 06/12/2015, Until Discontinued, No Print      CONTINUE these medications which have NOT CHANGED   Details  atenolol (TENORMIN) 100 MG tablet Take 100 mg by mouth daily., Until Discontinued, Historical Med    levothyroxine (SYNTHROID, LEVOTHROID) 200 MCG tablet Take 200-300 mcg by mouth daily. 200 mcg Monday, Wednesday, and Friday.  300 mcg Tuesday, Thursday, Saturday, and Sunday., Until Discontinued, Historical Med    Potassium Chloride ER 20 MEQ TBCR Take 20 mEq by mouth 3 (three) times daily., Starting 12/11/2013, Until Discontinued, Normal       Allergies  Allergen Reactions  . Lisinopril Swelling    Angioedema of the tongue   Follow-up Information    Follow up with [facility]. Schedule an appointment as soon as possible for a visit in 1 week.   Specialty:  Wound Care   Contact information:   [address], [address] [place] [phone]      Follow up with [facility]             .   Why:  June 19, 2015 at 1:30 pm    Contact information:   [address] [place] [address] [phone]      Follow up with Harvie Junior, MD.   Specialty:  Specialist   Why:  walk in clinic    Contact information:   [place] [address] [phone]        The results of significant diagnostics from this hospitalization (including imaging, microbiology, ancillary and laboratory) are listed below for reference.    Significant Diagnostic Studies: Ct Head Wo Contrast  05/27/2015   CLINICAL DATA:  Right-sided weakness; 2 days ago his right leg became red and swollen. Pt is hot to touch with edema and weeping from that leg. Pt with hx of downs syndrome. Pt saw a wound specialist a couple of days ago but it was only cleaned. CBG-159 Bp-160/100 Hr-96 NSR 20g left hand. Pt able to stand on the leg  EXAM: CT HEAD WITHOUT CONTRAST  TECHNIQUE: Contiguous axial images were obtained from the base of the skull through the vertex without intravenous contrast.  COMPARISON:  None.  FINDINGS: Multiple missing teeth. Diffuse parenchymal atrophy. Patchy areas of hypoattenuation in deep and periventricular white matter bilaterally. Negative for acute intracranial hemorrhage, mass lesion, acute infarction, midline shift, or mass-effect. Acute infarct may be inapparent on noncontrast CT. Ventricles and sulci symmetric. Bone windows demonstrate no focal lesion.  IMPRESSION: 1. Negative for bleed or other acute intracranial process. 2. Atrophy and nonspecific white matter changes.   Electronically Signed   By: Lucrezia Europe M.D.   On: 05/27/2015 20:14   Mr Tibia Fibula Right Wo Contrast  06/03/2015   CLINICAL DATA:  Cellulitis and draining in the upper 1/3 of the right lower leg. Leukocytosis. Question abscess.  EXAM: MRI OF LOWER RIGHT EXTREMITY WITHOUT CONTRAST  TECHNIQUE: Multiplanar, multisequence MR imaging of the right lower  leg was performed. No intravenous contrast was administered.  COMPARISON:  Plain films right lower leg 07/20/2004.  FINDINGS: The axial and coronal sequences incidentally include the left lower leg. There is extensive subcutaneous edema about the right lower leg. In the subcutaneous tissues along the medial aspect of the right lower leg centered approximately 4 cm below the medial tibial plateau, there is a focal fluid collection which demonstrates mildly mixed signal on T1 weighted imaging and is T2 hyperintense. No other focal fluid collection is identified. There is no bone marrow signal abnormality to suggest osteomyelitis. No fracture or mass is seen. Incidentally imaged left lower leg demonstrates mild subcutaneous edema. Fatty atrophy of musculature of the lower lobes bilaterally is most likely due to disuse.  IMPRESSION: Extensive subcutaneous edema about the right lower leg is compatible with cellulitis. A focal subcutaneous collection in the anteromedial tissues approximately 4 cm below the medial tibial plateau shows some T1 hyperintensity which could be due to the presence of protein in an abscess and/or hemorrhage. There is no intramuscular fluid collection no evidence of osteomyelitis.   Electronically Signed   By: Inge Rise M.D.   On: 06/03/2015 14:17   US Aspiration  06/09/2015   CLINICAL DATA:  64 year old male with mental delay and right lower extremity edema and draining wound. Evaluate for subcutaneous fluid collection for aspiration.  EXAM: US ASPIRATION  Date: 06/09/2015  PROCEDURE: 1. Ultrasound-guided aspiration Interventional Radiologist:  Criselda Peaches, MD  ANESTHESIA/SEDATION: None required  MEDICATIONS: None  TECHNIQUE: Informed consent was obtained from the patient following explanation of the procedure, risks, benefits and alternatives. The patient understands, agrees and consents for the procedure. All questions were addressed. A time out was performed.  The right lower  leg was interrogated with ultrasound. In the medial aspect of the lower leg at the level of the knee just superior to the draining wound site there is a heterogeneous cystic collection measuring approximately 7.7 x 2.3 x 3.5 cm. A suitable skin entry site was selected and marked. The region was then sterilely prepped and draped in standard fashion using chlorhexidine skin prep. Local anesthesia was attained by infiltration with 1% lidocaine. Under real-time sonographic guidance, the collection was punctured with a 5 [place] Yueh centesis catheter. Aggressive aspiration yielded less than 5 cc of bloody fluid. This was sent for Gram stain and culture.  COMPLICATIONS: None  IMPRESSION: There is a complex fluid collection superior to the open draining wound site in the medial aspect of the lower leg at the level of the knee.  Aspiration yields only a small volume of bloody fluid. The sonographic appearance and aspirated material is most consistent with a hematoma. The sample was  sent for Gram stain and culture.  Signed,  Criselda Peaches, MD  [facility]  [facility]   Electronically Signed   By: Jacqulynn Cadet M.D.   On: 06/09/2015 16:58    Microbiology: Recent Results (from the past 240 hour(s))  Surgical pcr screen     Status: None   Collection Time: 06/06/15  5:42 AM  Result Value Ref Range Status   MRSA, PCR NEGATIVE NEGATIVE Final   Staphylococcus aureus NEGATIVE NEGATIVE Final    Comment:        The Xpert SA Assay (FDA approved for NASAL specimens in patients over 43 years of age), is one component of a comprehensive surveillance program.  Test performance has been validated by [facility] for patients greater than or equal to 47 year old. It is not intended to diagnose infection nor to guide or monitor treatment.   Culture, routine-abscess     Status: None   Collection Time: 06/06/15  2:39 PM  Result Value Ref Range Status   Specimen  Description ABSCESS RIGHT LEG  Final   Special Requests NONE  Final   Gram Stain   Final    ABUNDANT WBC PRESENT,BOTH PMN AND MONONUCLEAR NO SQUAMOUS EPITHELIAL CELLS SEEN NO ORGANISMS SEEN Performed at [facility]    Culture   Final    NO GROWTH 4 DAYS Performed at [facility]    Report Status 06/10/2015 FINAL  Final     Labs: Basic Metabolic Panel:  Recent Labs Lab 06/06/15 0354 06/07/15 0323 06/08/15 0525 06/09/15 0426 06/10/15 0652  NA 138 139 141 141 140  K 3.2* 3.6 3.6 3.7 3.8  CL 102 106 106 106 109  CO2 27 23 27 24 25   GLUCOSE 110* 93 103* 96 99  BUN 10 10 12 13 12   CREATININE 2.10* 2.22* 2.14* 1.95* 1.79*  CALCIUM 7.6* 7.8* 8.0* 8.1* 8.2*   Liver Function Tests: No results for input(s): AST, ALT, ALKPHOS, BILITOT, PROT, ALBUMIN in the last 168 hours. No results for input(s): LIPASE, AMYLASE in the last 168 hours. No results for input(s): AMMONIA in the last 168 hours. CBC:  Recent Labs Lab 06/04/15 0440 06/04/15 1623 06/05/15 0328 06/06/15 0354 06/10/15 0652  WBC 10.2 12.3* 9.6 10.5 11.0*  HGB 14.4 15.6 13.5 13.6 13.7  HCT 43.8 46.8 41.9 41.8 42.8  MCV 86.9 86.5 85.9 86.7 87.7  PLT 293 325 291 298 284   Cardiac Enzymes: No results for input(s): CKTOTAL, CKMB, CKMBINDEX, TROPONINI in the last 168 hours. BNP: BNP (last 3 results) No results for input(s): BNP in the last 8760 hours.  ProBNP (last 3 results) No results for input(s): PROBNP in the last 8760 hours.  CBG: No results for input(s): GLUCAP in the last 168 hours.     SignedIrine Seal MD [facility] 06/10/2015, 9:19 PM

## 2015-06-10 NOTE — Care Management Note (Addendum)
Case Management Note  Patient Details  Name: Eduardo Herman MRN: [id number] Date of Birth: 25-Jul-1951  Subjective/Objective:                    Action/Plan:  Called patient's father . Dr Melford Aase is no longer patient's PCP ( cancelled appointment) . Patient goes to [facility] located [place] [phone] . Called same Clinic is a walk in clinic unable to schedule appointment . Patient is seen by DR York Ram .  [name] would like [facility] for PT/OT/RN/aide /SW .  Discussed wound care center appointment with father .   Patient's PCP is DR Unk Pinto [phone] , scheduled follow up appointment for June 18, 2015 at 1100 am .   Called patient's father , left message for him to call [facility] regarding discharge and home health   Spoke with Lattie Haw at [facility] [phone] , first available appointment is June 19, 2015 at 1 : 30 pm .      Expected Discharge Date:                  Expected Discharge Plan:  [place]  In-House Referral:     Discharge planning Services  CM Consult  Post Acute Care Choice:  Home Health Choice offered to:  Parent  DME Arranged:    DME Agency:     HH Arranged:    [place] Agency:     Status of Service:  In process, will continue to follow  Medicare Important Message Given:  Yes-fourth notification given Date Medicare IM Given:    Medicare IM give by:    Date Additional Medicare IM Given:    Additional Medicare Important Message give by:     If discussed at [place] of Stay Meetings, dates discussed:    Additional Comments:  Marilu Favre, RN 06/10/2015, 9:57 AM

## 2015-06-10 NOTE — Clinical Social Work Note (Signed)
CSW received referral for SNF.  Case discussed with patient and his family, and plan is to discharge home with home health.  Case manager aware, CSW to sign off please re-consult if social work needs arise.  Jones Broom. Hector, MSW, [place]

## 2015-06-19 ENCOUNTER — Encounter (HOSPITAL_BASED_OUTPATIENT_CLINIC_OR_DEPARTMENT_OTHER): Payer: Medicare Other | Attending: Internal Medicine

## 2015-06-19 DIAGNOSIS — I87331 Chronic venous hypertension (idiopathic) with ulcer and inflammation of right lower extremity: Secondary | ICD-10-CM | POA: Diagnosis not present

## 2015-06-19 DIAGNOSIS — G629 Polyneuropathy, unspecified: Secondary | ICD-10-CM | POA: Diagnosis not present

## 2015-06-19 DIAGNOSIS — I890 Lymphedema, not elsewhere classified: Secondary | ICD-10-CM | POA: Diagnosis not present

## 2015-06-19 DIAGNOSIS — L97212 Non-pressure chronic ulcer of right calf with fat layer exposed: Secondary | ICD-10-CM | POA: Diagnosis not present

## 2015-06-26 DIAGNOSIS — I87331 Chronic venous hypertension (idiopathic) with ulcer and inflammation of right lower extremity: Secondary | ICD-10-CM | POA: Diagnosis not present

## 2015-06-26 DIAGNOSIS — L97212 Non-pressure chronic ulcer of right calf with fat layer exposed: Secondary | ICD-10-CM | POA: Diagnosis not present

## 2015-06-26 DIAGNOSIS — I890 Lymphedema, not elsewhere classified: Secondary | ICD-10-CM | POA: Diagnosis not present

## 2015-07-03 ENCOUNTER — Encounter (HOSPITAL_BASED_OUTPATIENT_CLINIC_OR_DEPARTMENT_OTHER): Payer: Medicare Other | Attending: Internal Medicine

## 2015-07-03 DIAGNOSIS — Z872 Personal history of diseases of the skin and subcutaneous tissue: Secondary | ICD-10-CM | POA: Diagnosis not present

## 2015-07-03 DIAGNOSIS — E669 Obesity, unspecified: Secondary | ICD-10-CM | POA: Insufficient documentation

## 2015-07-03 DIAGNOSIS — I739 Peripheral vascular disease, unspecified: Secondary | ICD-10-CM | POA: Insufficient documentation

## 2015-07-03 DIAGNOSIS — I10 Essential (primary) hypertension: Secondary | ICD-10-CM | POA: Diagnosis not present

## 2015-07-03 DIAGNOSIS — E039 Hypothyroidism, unspecified: Secondary | ICD-10-CM | POA: Diagnosis not present

## 2015-07-03 DIAGNOSIS — Q909 Down syndrome, unspecified: Secondary | ICD-10-CM | POA: Insufficient documentation

## 2015-07-03 DIAGNOSIS — Z6841 Body Mass Index (BMI) 40.0 and over, adult: Secondary | ICD-10-CM | POA: Diagnosis not present

## 2015-07-03 DIAGNOSIS — I878 Other specified disorders of veins: Secondary | ICD-10-CM | POA: Diagnosis present

## 2015-07-03 DIAGNOSIS — G629 Polyneuropathy, unspecified: Secondary | ICD-10-CM | POA: Insufficient documentation

## 2015-07-15 DIAGNOSIS — L03115 Cellulitis of right lower limb: Secondary | ICD-10-CM

## 2016-05-18 ENCOUNTER — Encounter (HOSPITAL_BASED_OUTPATIENT_CLINIC_OR_DEPARTMENT_OTHER): Payer: Medicare Other | Attending: Internal Medicine

## 2016-05-18 DIAGNOSIS — E1140 Type 2 diabetes mellitus with diabetic neuropathy, unspecified: Secondary | ICD-10-CM | POA: Insufficient documentation

## 2016-05-18 DIAGNOSIS — I10 Essential (primary) hypertension: Secondary | ICD-10-CM | POA: Diagnosis not present

## 2016-05-18 DIAGNOSIS — Z6841 Body Mass Index (BMI) 40.0 and over, adult: Secondary | ICD-10-CM | POA: Insufficient documentation

## 2016-05-18 DIAGNOSIS — L97821 Non-pressure chronic ulcer of other part of left lower leg limited to breakdown of skin: Secondary | ICD-10-CM | POA: Insufficient documentation

## 2016-05-18 DIAGNOSIS — Q909 Down syndrome, unspecified: Secondary | ICD-10-CM | POA: Diagnosis not present

## 2016-05-18 DIAGNOSIS — F17210 Nicotine dependence, cigarettes, uncomplicated: Secondary | ICD-10-CM | POA: Insufficient documentation

## 2016-05-18 DIAGNOSIS — I890 Lymphedema, not elsewhere classified: Secondary | ICD-10-CM | POA: Diagnosis not present

## 2016-05-18 DIAGNOSIS — E1151 Type 2 diabetes mellitus with diabetic peripheral angiopathy without gangrene: Secondary | ICD-10-CM | POA: Diagnosis not present

## 2016-05-18 DIAGNOSIS — E039 Hypothyroidism, unspecified: Secondary | ICD-10-CM | POA: Insufficient documentation

## 2016-05-18 DIAGNOSIS — Z9119 Patient's noncompliance with other medical treatment and regimen: Secondary | ICD-10-CM | POA: Diagnosis not present

## 2016-05-26 DIAGNOSIS — L97821 Non-pressure chronic ulcer of other part of left lower leg limited to breakdown of skin: Secondary | ICD-10-CM | POA: Diagnosis not present

## 2016-06-02 ENCOUNTER — Encounter (HOSPITAL_BASED_OUTPATIENT_CLINIC_OR_DEPARTMENT_OTHER): Payer: Medicare Other | Attending: Surgery

## 2016-06-02 DIAGNOSIS — Q909 Down syndrome, unspecified: Secondary | ICD-10-CM | POA: Insufficient documentation

## 2016-06-02 DIAGNOSIS — L97821 Non-pressure chronic ulcer of other part of left lower leg limited to breakdown of skin: Secondary | ICD-10-CM | POA: Insufficient documentation

## 2016-06-02 DIAGNOSIS — F17210 Nicotine dependence, cigarettes, uncomplicated: Secondary | ICD-10-CM | POA: Diagnosis not present

## 2016-06-02 DIAGNOSIS — F79 Unspecified intellectual disabilities: Secondary | ICD-10-CM | POA: Diagnosis not present

## 2016-06-02 DIAGNOSIS — Z6841 Body Mass Index (BMI) 40.0 and over, adult: Secondary | ICD-10-CM | POA: Diagnosis not present

## 2016-06-02 DIAGNOSIS — I890 Lymphedema, not elsewhere classified: Secondary | ICD-10-CM | POA: Diagnosis not present

## 2016-06-02 DIAGNOSIS — E1140 Type 2 diabetes mellitus with diabetic neuropathy, unspecified: Secondary | ICD-10-CM | POA: Insufficient documentation

## 2016-06-02 DIAGNOSIS — I10 Essential (primary) hypertension: Secondary | ICD-10-CM | POA: Insufficient documentation

## 2016-06-09 DIAGNOSIS — L97821 Non-pressure chronic ulcer of other part of left lower leg limited to breakdown of skin: Secondary | ICD-10-CM | POA: Diagnosis not present

## 2016-06-16 DIAGNOSIS — L97821 Non-pressure chronic ulcer of other part of left lower leg limited to breakdown of skin: Secondary | ICD-10-CM | POA: Diagnosis not present

## 2016-06-22 DIAGNOSIS — L97821 Non-pressure chronic ulcer of other part of left lower leg limited to breakdown of skin: Secondary | ICD-10-CM | POA: Diagnosis not present

## 2016-06-29 DIAGNOSIS — L97821 Non-pressure chronic ulcer of other part of left lower leg limited to breakdown of skin: Secondary | ICD-10-CM | POA: Diagnosis not present

## 2016-07-06 ENCOUNTER — Encounter (HOSPITAL_BASED_OUTPATIENT_CLINIC_OR_DEPARTMENT_OTHER): Payer: Medicare Other | Attending: Surgery

## 2016-10-08 ENCOUNTER — Other Ambulatory Visit: Payer: Self-pay

## 2017-06-12 IMAGING — MR MR [PERSON_NAME] LOW W/O CM*R*
5 of 10 series · 19 of 40 positions shown · non-contrast
Comparison: Plain films right lower leg 07/20/2004.

CLINICAL DATA: Cellulitis and draining in the upper [DATE] of the
right lower leg. Leukocytosis. Question abscess.

EXAM:
MRI OF LOWER RIGHT EXTREMITY WITHOUT CONTRAST
TECHNIQUE: Multiplanar, multisequence MR imaging of the right lower leg was
performed. No intravenous contrast was administered.

[Series 2: STIR · coronal · 5.0mm · 0.98mm/px · 2 of 21 slices shown]
[im 1/21]
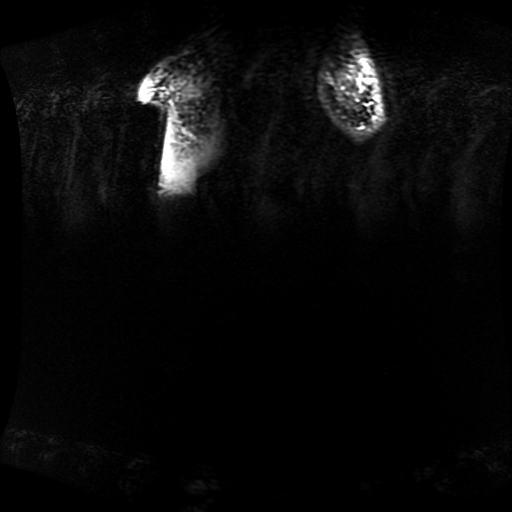
[im 21/21]
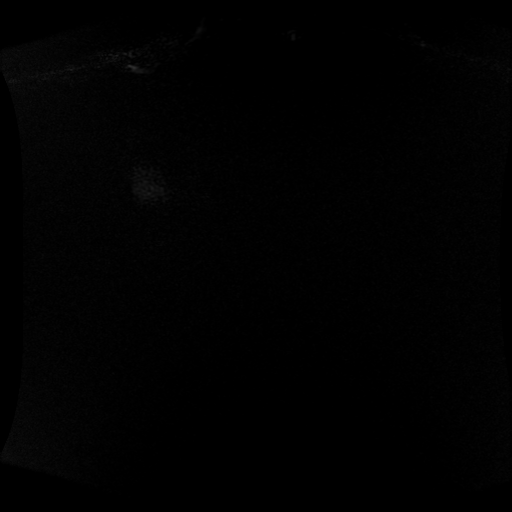

[Series 5: T1 · axial · 10.0mm · 0.78mm/px · z∈[-163,+256]mm · 6 of 36 slices shown (1 of 4)]
[im 1/36]
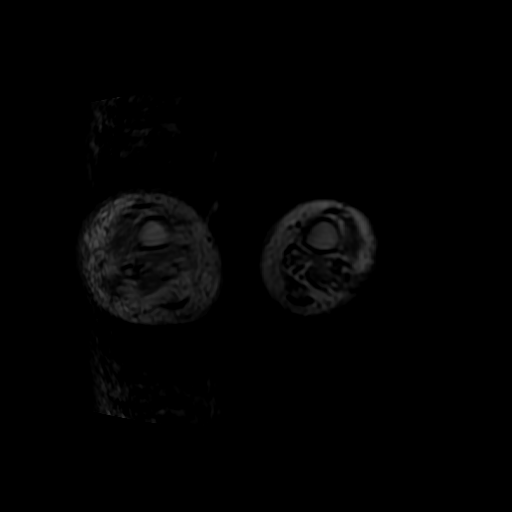
[im 8/36]
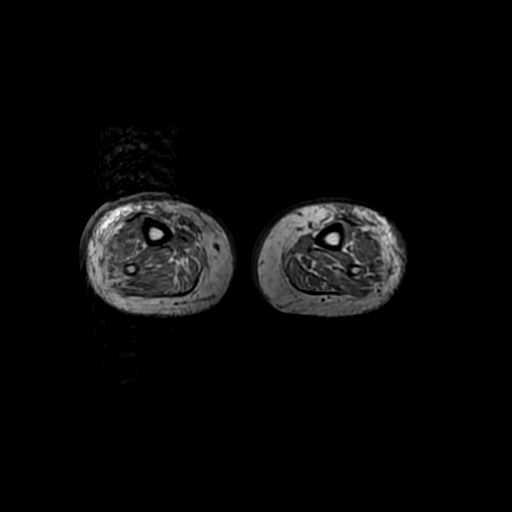
[im 15/36]
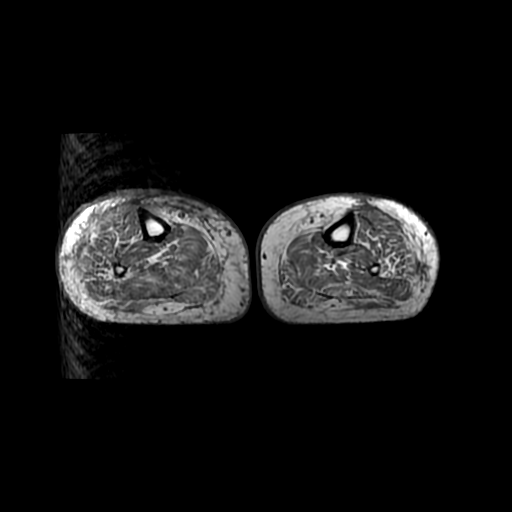
[im 22/36]
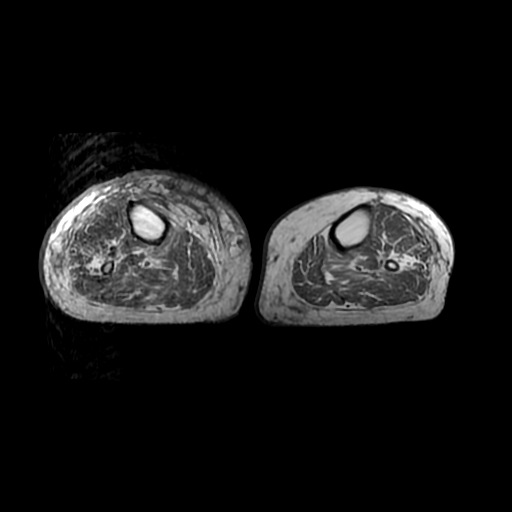
[im 29/36]
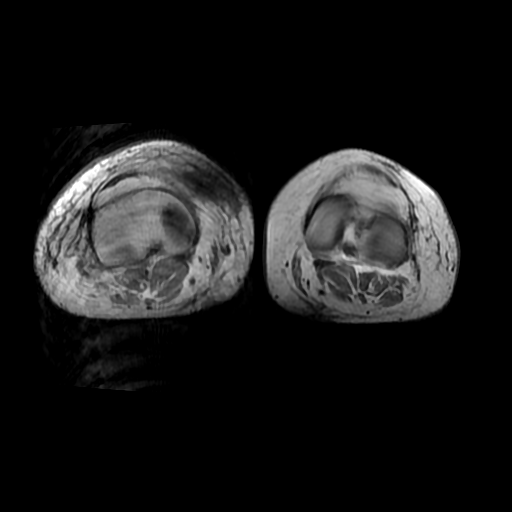
[im 36/36]
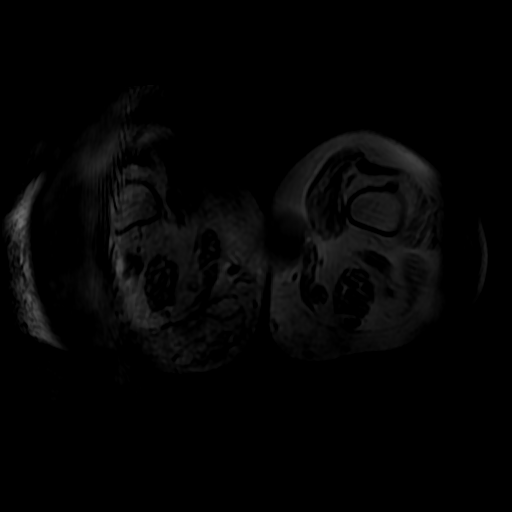

[Series 8: T1 · coronal · 5.0mm · 0.98mm/px · 3 of 21 slices shown (2 of 4)]
[im 1/21]
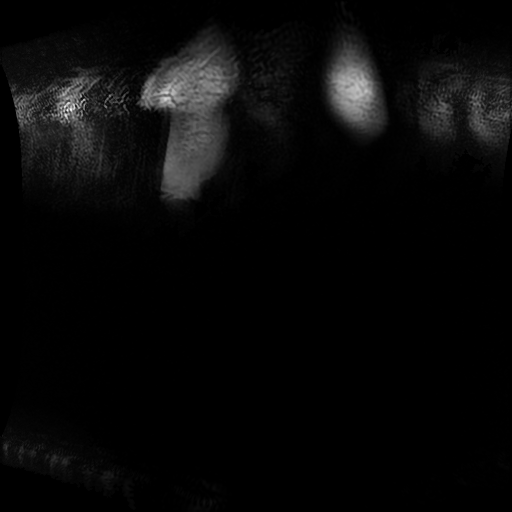
[im 11/21]
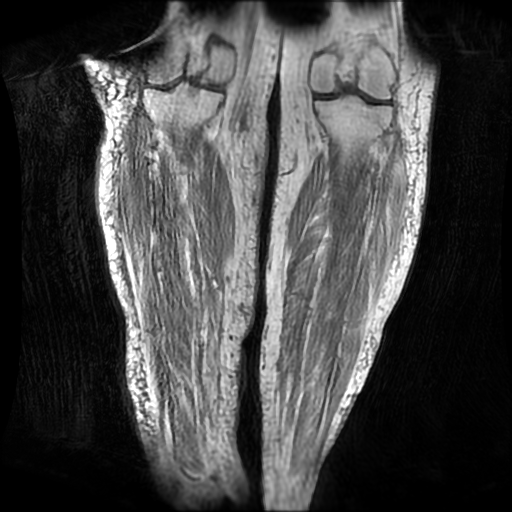
[im 21/21]
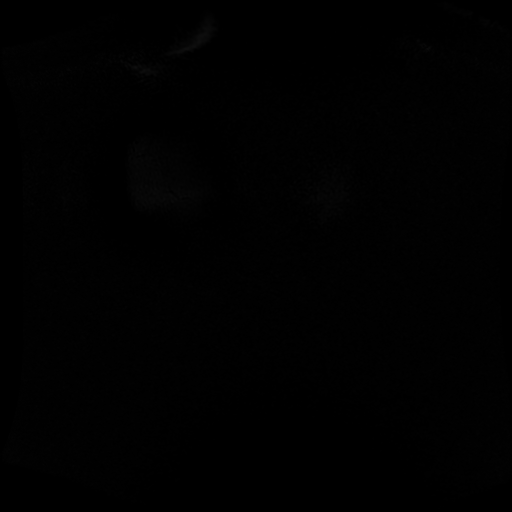

[Series 9: T1 · sagittal · 5.0mm · 0.98mm/px · 4 of 25 slices shown (3 of 4)]
[im 1/25]
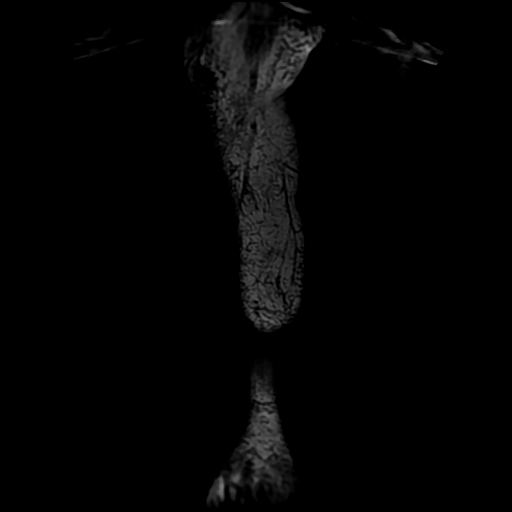
[im 9/25]
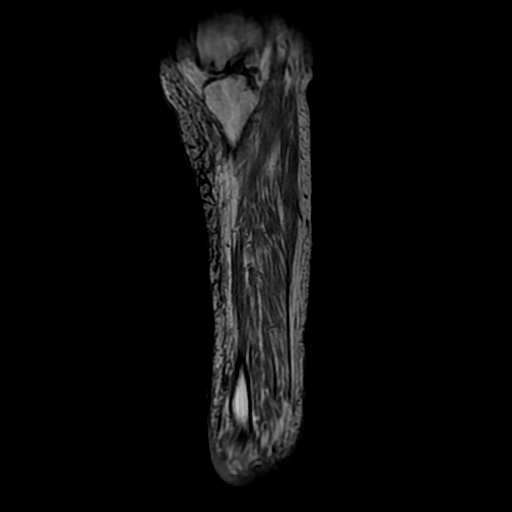
[im 17/25]
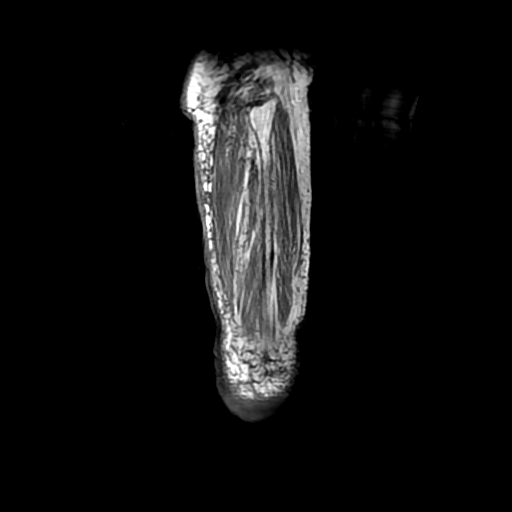
[im 25/25]
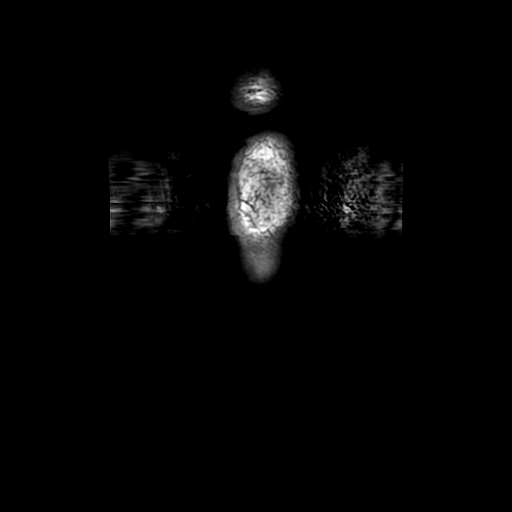

[Series 10: T1 · sagittal · 5.0mm · 0.90mm/px · 4 of 25 slices shown (4 of 4)]
[im 1/25]
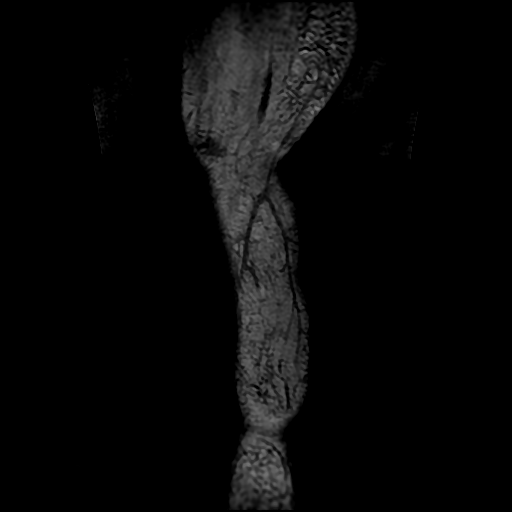
[im 9/25]
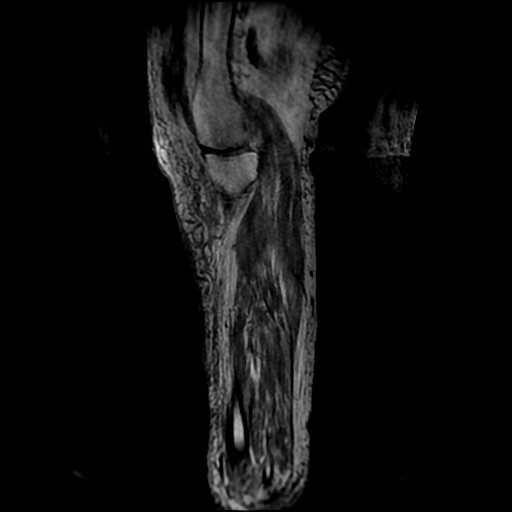
[im 17/25]
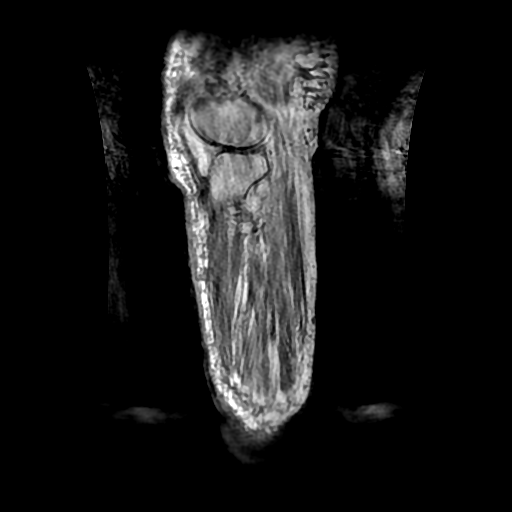
[im 25/25]
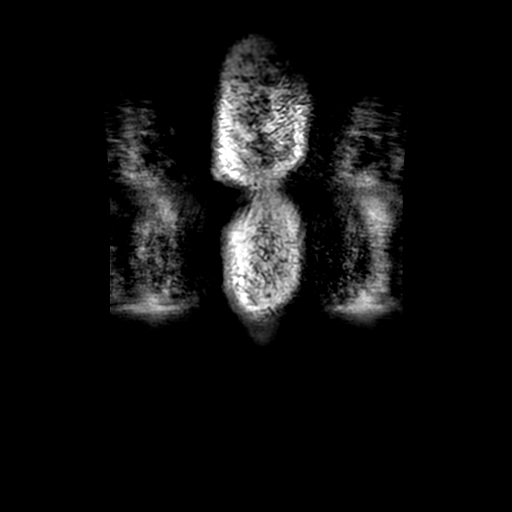

[19 of 40 positions shown; findings below may reference images not displayed]

FINDINGS: The axial and coronal sequences incidentally include the left lower
leg. There is extensive subcutaneous edema about the right lower
leg. In the subcutaneous tissues along the medial aspect of the
right lower leg centered approximately 4 cm below the medial tibial
plateau, there is a focal fluid collection which demonstrates mildly
mixed signal on T1 weighted imaging and is T2 hyperintense. No other
focal fluid collection is identified. There is no bone marrow signal
abnormality to suggest osteomyelitis. No fracture or mass is seen.
Incidentally imaged left lower leg demonstrates mild subcutaneous
edema. Fatty atrophy of musculature of the lower lobes bilaterally
is most likely due to disuse.
IMPRESSION: Extensive subcutaneous edema about the right lower leg is compatible
with cellulitis. A focal subcutaneous collection in the anteromedial
tissues approximately 4 cm below the medial tibial plateau shows
some T1 hyperintensity which could be due to the presence of protein
in an abscess and/or hemorrhage. There is no intramuscular fluid
collection no evidence of osteomyelitis.

## 2017-06-24 ENCOUNTER — Encounter (HOSPITAL_COMMUNITY): Payer: Self-pay

## 2017-06-24 ENCOUNTER — Inpatient Hospital Stay (HOSPITAL_COMMUNITY): Payer: Medicare Other

## 2017-06-24 ENCOUNTER — Emergency Department (HOSPITAL_COMMUNITY): Payer: Medicare Other

## 2017-06-24 ENCOUNTER — Inpatient Hospital Stay (HOSPITAL_COMMUNITY)
Admission: EM | Admit: 2017-06-24 | Discharge: 2017-08-01 | DRG: 483 | Disposition: E | Payer: Medicare Other | Attending: Internal Medicine | Admitting: Internal Medicine

## 2017-06-24 DIAGNOSIS — I739 Peripheral vascular disease, unspecified: Secondary | ICD-10-CM | POA: Diagnosis present

## 2017-06-24 DIAGNOSIS — R05 Cough: Secondary | ICD-10-CM

## 2017-06-24 DIAGNOSIS — E870 Hyperosmolality and hypernatremia: Secondary | ICD-10-CM | POA: Diagnosis not present

## 2017-06-24 DIAGNOSIS — Z789 Other specified health status: Secondary | ICD-10-CM

## 2017-06-24 DIAGNOSIS — N179 Acute kidney failure, unspecified: Secondary | ICD-10-CM | POA: Diagnosis present

## 2017-06-24 DIAGNOSIS — E876 Hypokalemia: Secondary | ICD-10-CM | POA: Diagnosis present

## 2017-06-24 DIAGNOSIS — Z8279 Family history of other congenital malformations, deformations and chromosomal abnormalities: Secondary | ICD-10-CM

## 2017-06-24 DIAGNOSIS — N183 Chronic kidney disease, stage 3 unspecified: Secondary | ICD-10-CM | POA: Diagnosis present

## 2017-06-24 DIAGNOSIS — Z720 Tobacco use: Secondary | ICD-10-CM | POA: Diagnosis not present

## 2017-06-24 DIAGNOSIS — J9601 Acute respiratory failure with hypoxia: Secondary | ICD-10-CM

## 2017-06-24 DIAGNOSIS — Z96611 Presence of right artificial shoulder joint: Secondary | ICD-10-CM

## 2017-06-24 DIAGNOSIS — J690 Pneumonitis due to inhalation of food and vomit: Secondary | ICD-10-CM | POA: Diagnosis not present

## 2017-06-24 DIAGNOSIS — F79 Unspecified intellectual disabilities: Secondary | ICD-10-CM

## 2017-06-24 DIAGNOSIS — Z9049 Acquired absence of other specified parts of digestive tract: Secondary | ICD-10-CM

## 2017-06-24 DIAGNOSIS — R1084 Generalized abdominal pain: Secondary | ICD-10-CM | POA: Diagnosis not present

## 2017-06-24 DIAGNOSIS — K56609 Unspecified intestinal obstruction, unspecified as to partial versus complete obstruction: Secondary | ICD-10-CM

## 2017-06-24 DIAGNOSIS — K922 Gastrointestinal hemorrhage, unspecified: Secondary | ICD-10-CM | POA: Diagnosis not present

## 2017-06-24 DIAGNOSIS — S42211A Unspecified displaced fracture of surgical neck of right humerus, initial encounter for closed fracture: Secondary | ICD-10-CM | POA: Diagnosis present

## 2017-06-24 DIAGNOSIS — R0902 Hypoxemia: Secondary | ICD-10-CM | POA: Diagnosis not present

## 2017-06-24 DIAGNOSIS — J9600 Acute respiratory failure, unspecified whether with hypoxia or hypercapnia: Secondary | ICD-10-CM | POA: Diagnosis not present

## 2017-06-24 DIAGNOSIS — G9340 Encephalopathy, unspecified: Secondary | ICD-10-CM | POA: Diagnosis not present

## 2017-06-24 DIAGNOSIS — W19XXXD Unspecified fall, subsequent encounter: Secondary | ICD-10-CM | POA: Diagnosis not present

## 2017-06-24 DIAGNOSIS — F17210 Nicotine dependence, cigarettes, uncomplicated: Secondary | ICD-10-CM | POA: Diagnosis present

## 2017-06-24 DIAGNOSIS — E039 Hypothyroidism, unspecified: Secondary | ICD-10-CM | POA: Diagnosis present

## 2017-06-24 DIAGNOSIS — W1830XA Fall on same level, unspecified, initial encounter: Secondary | ICD-10-CM | POA: Diagnosis present

## 2017-06-24 DIAGNOSIS — Z810 Family history of intellectual disabilities: Secondary | ICD-10-CM | POA: Diagnosis not present

## 2017-06-24 DIAGNOSIS — R059 Cough, unspecified: Secondary | ICD-10-CM

## 2017-06-24 DIAGNOSIS — K567 Ileus, unspecified: Secondary | ICD-10-CM

## 2017-06-24 DIAGNOSIS — Z9119 Patient's noncompliance with other medical treatment and regimen: Secondary | ICD-10-CM

## 2017-06-24 DIAGNOSIS — Z888 Allergy status to other drugs, medicaments and biological substances status: Secondary | ICD-10-CM

## 2017-06-24 DIAGNOSIS — R109 Unspecified abdominal pain: Secondary | ICD-10-CM | POA: Diagnosis present

## 2017-06-24 DIAGNOSIS — R7303 Prediabetes: Secondary | ICD-10-CM | POA: Diagnosis present

## 2017-06-24 DIAGNOSIS — I129 Hypertensive chronic kidney disease with stage 1 through stage 4 chronic kidney disease, or unspecified chronic kidney disease: Secondary | ICD-10-CM | POA: Diagnosis present

## 2017-06-24 DIAGNOSIS — R140 Abdominal distension (gaseous): Secondary | ICD-10-CM | POA: Diagnosis present

## 2017-06-24 DIAGNOSIS — R6520 Severe sepsis without septic shock: Secondary | ICD-10-CM | POA: Diagnosis not present

## 2017-06-24 DIAGNOSIS — Z4659 Encounter for fitting and adjustment of other gastrointestinal appliance and device: Secondary | ICD-10-CM

## 2017-06-24 DIAGNOSIS — A419 Sepsis, unspecified organism: Secondary | ICD-10-CM | POA: Diagnosis not present

## 2017-06-24 DIAGNOSIS — I480 Paroxysmal atrial fibrillation: Secondary | ICD-10-CM | POA: Diagnosis present

## 2017-06-24 DIAGNOSIS — Z6841 Body Mass Index (BMI) 40.0 and over, adult: Secondary | ICD-10-CM

## 2017-06-24 DIAGNOSIS — W19XXXA Unspecified fall, initial encounter: Secondary | ICD-10-CM | POA: Diagnosis not present

## 2017-06-24 DIAGNOSIS — N136 Pyonephrosis: Secondary | ICD-10-CM | POA: Diagnosis present

## 2017-06-24 DIAGNOSIS — I469 Cardiac arrest, cause unspecified: Secondary | ICD-10-CM | POA: Diagnosis not present

## 2017-06-24 DIAGNOSIS — I878 Other specified disorders of veins: Secondary | ICD-10-CM | POA: Diagnosis present

## 2017-06-24 DIAGNOSIS — R001 Bradycardia, unspecified: Secondary | ICD-10-CM | POA: Diagnosis not present

## 2017-06-24 DIAGNOSIS — Q909 Down syndrome, unspecified: Secondary | ICD-10-CM | POA: Diagnosis not present

## 2017-06-24 DIAGNOSIS — N200 Calculus of kidney: Secondary | ICD-10-CM | POA: Diagnosis not present

## 2017-06-24 DIAGNOSIS — B964 Proteus (mirabilis) (morganii) as the cause of diseases classified elsewhere: Secondary | ICD-10-CM | POA: Diagnosis present

## 2017-06-24 DIAGNOSIS — J9690 Respiratory failure, unspecified, unspecified whether with hypoxia or hypercapnia: Secondary | ICD-10-CM

## 2017-06-24 DIAGNOSIS — I10 Essential (primary) hypertension: Secondary | ICD-10-CM | POA: Diagnosis not present

## 2017-06-24 DIAGNOSIS — I872 Venous insufficiency (chronic) (peripheral): Secondary | ICD-10-CM | POA: Diagnosis present

## 2017-06-24 DIAGNOSIS — S42291A Other displaced fracture of upper end of right humerus, initial encounter for closed fracture: Secondary | ICD-10-CM | POA: Diagnosis not present

## 2017-06-24 DIAGNOSIS — R1110 Vomiting, unspecified: Secondary | ICD-10-CM

## 2017-06-24 DIAGNOSIS — G4733 Obstructive sleep apnea (adult) (pediatric): Secondary | ICD-10-CM | POA: Diagnosis present

## 2017-06-24 DIAGNOSIS — N132 Hydronephrosis with renal and ureteral calculous obstruction: Secondary | ICD-10-CM

## 2017-06-24 DIAGNOSIS — R748 Abnormal levels of other serum enzymes: Secondary | ICD-10-CM

## 2017-06-24 LAB — URINALYSIS, ROUTINE W REFLEX MICROSCOPIC
Bilirubin Urine: NEGATIVE
Glucose, UA: NEGATIVE mg/dL
KETONES UR: 5 mg/dL — AB
Nitrite: NEGATIVE
PH: 5 (ref 5.0–8.0)
Protein, ur: 100 mg/dL — AB
Specific Gravity, Urine: 1.021 (ref 1.005–1.030)

## 2017-06-24 LAB — CBC
HCT: 42.3 % (ref 39.0–52.0)
Hemoglobin: 14.2 g/dL (ref 13.0–17.0)
MCH: 29.5 pg (ref 26.0–34.0)
MCHC: 33.6 g/dL (ref 30.0–36.0)
MCV: 87.9 fL (ref 78.0–100.0)
PLATELETS: 162 10*3/uL (ref 150–400)
RBC: 4.81 MIL/uL (ref 4.22–5.81)
RDW: 15.6 % — AB (ref 11.5–15.5)
WBC: 12.9 10*3/uL — AB (ref 4.0–10.5)

## 2017-06-24 LAB — BASIC METABOLIC PANEL
ANION GAP: 8 (ref 5–15)
Anion gap: 8 (ref 5–15)
BUN: 23 mg/dL — ABNORMAL HIGH (ref 6–20)
BUN: 23 mg/dL — AB (ref 6–20)
CALCIUM: 8 mg/dL — AB (ref 8.9–10.3)
CHLORIDE: 108 mmol/L (ref 101–111)
CO2: 24 mmol/L (ref 22–32)
CO2: 23 mmol/L (ref 22–32)
CREATININE: 2.13 mg/dL — AB (ref 0.61–1.24)
Calcium: 8.3 mg/dL — ABNORMAL LOW (ref 8.9–10.3)
Chloride: 109 mmol/L (ref 101–111)
Creatinine, Ser: 1.96 mg/dL — ABNORMAL HIGH (ref 0.61–1.24)
GFR calc Af Amer: 35 mL/min — ABNORMAL LOW (ref >60)
GFR calc Af Amer: 39 mL/min — ABNORMAL LOW (ref >60)
GFR calc non Af Amer: 30 mL/min — ABNORMAL LOW (ref >60)
GFR, EST NON AFRICAN AMERICAN: 34 mL/min — AB (ref >60)
GLUCOSE: 159 mg/dL — AB (ref 65–99)
GLUCOSE: 157 mg/dL — AB (ref 65–99)
Potassium: 3.3 mmol/L — ABNORMAL LOW (ref 3.5–5.1)
Potassium: 3.2 mmol/L — ABNORMAL LOW (ref 3.5–5.1)
Sodium: 140 mmol/L (ref 135–145)
Sodium: 140 mmol/L (ref 135–145)

## 2017-06-24 LAB — CBC WITH DIFFERENTIAL/PLATELET
Basophils Absolute: 0 10*3/uL (ref 0.0–0.1)
Basophils Relative: 0 %
EOS ABS: 0 10*3/uL (ref 0.0–0.7)
Eosinophils Relative: 0 %
HCT: 44.8 % (ref 39.0–52.0)
Hemoglobin: 15.1 g/dL (ref 13.0–17.0)
LYMPHS ABS: 1.1 10*3/uL (ref 0.7–4.0)
Lymphocytes Relative: 7 %
MCH: 29.7 pg (ref 26.0–34.0)
MCHC: 33.7 g/dL (ref 30.0–36.0)
MCV: 88 fL (ref 78.0–100.0)
MONO ABS: 2.1 10*3/uL — AB (ref 0.1–1.0)
MONOS PCT: 12 %
Neutro Abs: 13.9 10*3/uL — ABNORMAL HIGH (ref 1.7–7.7)
Neutrophils Relative %: 81 %
PLATELETS: 192 10*3/uL (ref 150–400)
RBC: 5.09 MIL/uL (ref 4.22–5.81)
RDW: 15.4 % (ref 11.5–15.5)
WBC: 17.2 10*3/uL — ABNORMAL HIGH (ref 4.0–10.5)

## 2017-06-24 LAB — PROTIME-INR
INR: 1.36
Prothrombin Time: 16.9 seconds — ABNORMAL HIGH (ref 11.4–15.2)

## 2017-06-24 LAB — CK: CK TOTAL: 85 U/L (ref 49–397)

## 2017-06-24 LAB — TYPE AND SCREEN
ABO/RH(D): O POS
ANTIBODY SCREEN: NEGATIVE

## 2017-06-24 LAB — SURGICAL PCR SCREEN
MRSA, PCR: NEGATIVE
Staphylococcus aureus: NEGATIVE

## 2017-06-24 LAB — ABO/RH: ABO/RH(D): O POS

## 2017-06-24 LAB — LIPASE, BLOOD: LIPASE: 18 U/L (ref 11–51)

## 2017-06-24 LAB — APTT: APTT: 34 s (ref 24–36)

## 2017-06-24 LAB — BRAIN NATRIURETIC PEPTIDE: B NATRIURETIC PEPTIDE 5: 215.5 pg/mL — AB (ref 0.0–100.0)

## 2017-06-24 LAB — TROPONIN I: Troponin I: <0.03 ng/mL (ref <0.03)

## 2017-06-24 MED ORDER — LEVOTHYROXINE SODIUM 200 MCG PO TABS: 200.0000 ug | ORAL_TABLET | Freq: Every day | ORAL | Status: DC

## 2017-06-24 MED ORDER — LEVOTHYROXINE SODIUM 100 MCG PO TABS: 200.0000 ug | ORAL_TABLET | ORAL | Status: DC

## 2017-06-24 MED ORDER — NICOTINE 21 MG/24HR TD PT24
21.0000 mg | MEDICATED_PATCH | Freq: Every day | TRANSDERMAL | Status: DC
  Administered 2017-06-24 – 2017-06-29 (×5): 21 mg via TRANSDERMAL
  Filled 2017-06-24 (×5): qty 1

## 2017-06-24 MED ORDER — DEXTROSE-NACL 5-0.45 % IV SOLN
INTRAVENOUS | Status: DC
  Administered 2017-06-24 – 2017-06-25 (×2): via INTRAVENOUS

## 2017-06-24 MED ORDER — HYDRALAZINE HCL 20 MG/ML IJ SOLN
5.0000 mg | INTRAMUSCULAR | Status: DC | PRN
  Administered 2017-07-02 (×2): 5 mg via INTRAVENOUS
  Filled 2017-06-24: qty 0.25
  Filled 2017-06-24 (×2): qty 1

## 2017-06-24 MED ORDER — TAMSULOSIN HCL 0.4 MG PO CAPS
0.4000 mg | ORAL_CAPSULE | Freq: Every day | ORAL | Status: DC
  Administered 2017-06-24 – 2017-06-29 (×5): 0.4 mg via ORAL
  Filled 2017-06-24 (×5): qty 1

## 2017-06-24 MED ORDER — OXYCODONE-ACETAMINOPHEN 5-325 MG PO TABS
2.0000 | ORAL_TABLET | ORAL | Status: DC | PRN
  Administered 2017-06-25 – 2017-06-28 (×7): 2 via ORAL
  Filled 2017-06-24 (×8): qty 2

## 2017-06-24 MED ORDER — METHOCARBAMOL 500 MG PO TABS
500.0000 mg | ORAL_TABLET | Freq: Three times a day (TID) | ORAL | Status: DC | PRN
  Administered 2017-06-28: 500 mg via ORAL
  Filled 2017-06-24: qty 1

## 2017-06-24 MED ORDER — ATENOLOL 50 MG PO TABS
100.0000 mg | ORAL_TABLET | Freq: Every day | ORAL | Status: DC
  Administered 2017-06-24 – 2017-06-28 (×5): 100 mg via ORAL
  Filled 2017-06-24 (×6): qty 2

## 2017-06-24 MED ORDER — POTASSIUM CHLORIDE 10 MEQ/100ML IV SOLN
10.0000 meq | INTRAVENOUS | Status: AC
  Administered 2017-06-24 (×2): 10 meq via INTRAVENOUS
  Filled 2017-06-24 (×2): qty 100

## 2017-06-24 MED ORDER — FENTANYL CITRATE (PF) 100 MCG/2ML IJ SOLN
50.0000 ug | Freq: Once | INTRAMUSCULAR | Status: AC
  Administered 2017-06-24: 50 ug via INTRAVENOUS
  Filled 2017-06-24: qty 2

## 2017-06-24 MED ORDER — ONDANSETRON HCL 4 MG/2ML IJ SOLN
4.0000 mg | Freq: Three times a day (TID) | INTRAMUSCULAR | Status: DC | PRN
  Administered 2017-06-25: 4 mg via INTRAVENOUS

## 2017-06-24 MED ORDER — ZOLPIDEM TARTRATE 5 MG PO TABS: 5.0000 mg | ORAL_TABLET | Freq: Every evening | ORAL | Status: DC | PRN

## 2017-06-24 MED ORDER — POTASSIUM CHLORIDE 20 MEQ/15ML (10%) PO SOLN: 20.0000 meq | Freq: Once | ORAL | Status: DC

## 2017-06-24 MED ORDER — LEVOTHYROXINE SODIUM 75 MCG PO TABS
225.0000 ug | ORAL_TABLET | Freq: Every day | ORAL | Status: DC
  Administered 2017-06-25 – 2017-06-28 (×4): 225 ug via ORAL
  Filled 2017-06-24: qty 3
  Filled 2017-06-24: qty 1
  Filled 2017-06-24: qty 3
  Filled 2017-06-24: qty 1

## 2017-06-24 MED ORDER — DEXTROSE 5 % IV SOLN
1.0000 g | INTRAVENOUS | Status: DC
  Administered 2017-06-24 – 2017-06-27 (×3): 1 g via INTRAVENOUS
  Filled 2017-06-24 (×4): qty 10

## 2017-06-24 MED ORDER — HYDROMORPHONE HCL-NACL 0.5-0.9 MG/ML-% IV SOSY
1.0000 mg | PREFILLED_SYRINGE | INTRAVENOUS | Status: DC | PRN
  Administered 2017-06-24: 1 mg via INTRAVENOUS
  Filled 2017-06-24: qty 2

## 2017-06-24 MED ORDER — SENNOSIDES-DOCUSATE SODIUM 8.6-50 MG PO TABS: 1.0000 | ORAL_TABLET | Freq: Every evening | ORAL | Status: DC | PRN

## 2017-06-24 MED ORDER — HYDROMORPHONE HCL 1 MG/ML IJ SOLN: 1.0000 mg | INTRAMUSCULAR | Status: DC | PRN

## 2017-06-24 MED ORDER — POLYETHYLENE GLYCOL 3350 17 G PO PACK
17.0000 g | PACK | Freq: Every day | ORAL | Status: DC
  Administered 2017-06-26 – 2017-06-29 (×3): 17 g via ORAL
  Filled 2017-06-24 (×3): qty 1

## 2017-06-24 MED ORDER — DILTIAZEM HCL ER COATED BEADS 180 MG PO CP24
360.0000 mg | ORAL_CAPSULE | Freq: Every day | ORAL | Status: DC
  Administered 2017-06-24: 12:00:00 360 mg via ORAL
  Filled 2017-06-24: qty 2

## 2017-06-24 MED ORDER — FUROSEMIDE 40 MG PO TABS: 40.0000 mg | ORAL_TABLET | Freq: Every day | ORAL | Status: DC

## 2017-06-24 MED ORDER — SODIUM CHLORIDE 0.9 % IV BOLUS (SEPSIS)
1000.0000 mL | Freq: Once | INTRAVENOUS | Status: AC
  Administered 2017-06-24: 1000 mL via INTRAVENOUS

## 2017-06-24 MED ORDER — LEVOTHYROXINE SODIUM 100 MCG PO TABS: 300.0000 ug | ORAL_TABLET | ORAL | Status: DC

## 2017-06-24 MED ORDER — ACETAMINOPHEN 325 MG PO TABS: 650.0000 mg | ORAL_TABLET | Freq: Four times a day (QID) | ORAL | Status: DC | PRN

## 2017-06-24 NOTE — Consult Note (Signed)
Reason for Consult:Right proximal humerus fracture Referring Physician:Dr. Jamori Biggar Schmeling is an 66 y.o. male.  HPI: 66 yo male with Down's syndrome who had a fall at home yesterday and was apparent ly found on the floor by family. Only complaint related to fall is right shoulder pain. Also has 3 day history of abdominal pain and distension which is being evaluated by the medical team. The patient complains of right shoulder pain but has no other pain complaints in that extremity. He has no reported numbness or tingling and reports no difficulty moving his hand or fingers.   Past Medical History:  Diagnosis Date  . Angioedema of lips    likely secondary to ACE inhibitor.   . Down's syndrome   . Hypertension   . Hypothyroidism   . Mental disorder   . Peripheral vascular disease ([place])   . Shortness of breath     Past Surgical History:  Procedure Laterality Date  . CHOLECYSTECTOMY      No family history on file.  Social History:  reports that he has been smoking Cigarettes.  He has never used smokeless tobacco. He reports that he does not drink alcohol or use drugs.  Allergies:  Allergies  Allergen Reactions  . Lisinopril Swelling    Angioedema of the tongue    Medications: I have reviewed the patient's current medications.  Results for orders placed or performed during the hospital encounter of 06/27/2017 (from the past 48 hour(s))  Urinalysis, Routine w reflex microscopic     Status: Abnormal   Collection Time: 06/04/2017  3:40 AM  Result Value Ref Range   Color, Urine YELLOW YELLOW   APPearance HAZY (A) CLEAR   Specific Gravity, Urine 1.021 1.005 - 1.030   pH 5.0 5.0 - 8.0   Glucose, UA NEGATIVE NEGATIVE mg/dL   Hgb urine dipstick MODERATE (A) NEGATIVE   Bilirubin Urine NEGATIVE NEGATIVE   Ketones, ur 5 (A) NEGATIVE mg/dL   Protein, ur 100 (A) NEGATIVE mg/dL   Nitrite NEGATIVE NEGATIVE   Leukocytes, UA TRACE (A) NEGATIVE   RBC / HPF 0-5 0 - 5 RBC/hpf   WBC, UA 6-30  0 - 5 WBC/hpf   Bacteria, UA RARE (A) NONE SEEN   Squamous Epithelial / LPF 0-5 (A) NONE SEEN   Mucus PRESENT   Basic metabolic panel     Status: Abnormal   Collection Time: 06/21/2017  3:52 AM  Result Value Ref Range   Sodium 140 135 - 145 mmol/L   Potassium 3.3 (L) 3.5 - 5.1 mmol/L   Chloride 108 101 - 111 mmol/L   CO2 24 22 - 32 mmol/L   Glucose, Bld 159 (H) 65 - 99 mg/dL   BUN 23 (H) 6 - 20 mg/dL   Creatinine, Ser 2.13 (H) 0.61 - 1.24 mg/dL   Calcium 8.3 (L) 8.9 - 10.3 mg/dL   GFR calc non Af Amer 30 (L) >60 mL/min   GFR calc Af Amer 35 (L) >60 mL/min    Comment: (NOTE) The eGFR has been calculated using the CKD EPI equation. This calculation has not been validated in all clinical situations. eGFR's persistently <60 mL/min signify possible Chronic Kidney Disease.    Anion gap 8 5 - 15  CBC with Differential/Platelet     Status: Abnormal   Collection Time: 06/27/2017  3:52 AM  Result Value Ref Range   WBC 17.2 (H) 4.0 - 10.5 K/uL   RBC 5.09 4.22 - 5.81 MIL/uL   Hemoglobin  15.1 13.0 - 17.0 g/dL   HCT 44.8 39.0 - 52.0 %   MCV 88.0 78.0 - 100.0 fL   MCH 29.7 26.0 - 34.0 pg   MCHC 33.7 30.0 - 36.0 g/dL   RDW 15.4 11.5 - 15.5 %   Platelets 192 150 - 400 K/uL   Neutrophils Relative % 81 %   Neutro Abs 13.9 (H) 1.7 - 7.7 K/uL   Lymphocytes Relative 7 %   Lymphs Abs 1.1 0.7 - 4.0 K/uL   Monocytes Relative 12 %   Monocytes Absolute 2.1 (H) 0.1 - 1.0 K/uL   Eosinophils Relative 0 %   Eosinophils Absolute 0.0 0.0 - 0.7 K/uL   Basophils Relative 0 %   Basophils Absolute 0.0 0.0 - 0.1 K/uL  Troponin I     Status: None   Collection Time: 06/26/2017  3:53 AM  Result Value Ref Range   Troponin I <0.03 <0.03 ng/mL  Type and screen [place]     Status: None (Preliminary result)   Collection Time: 06/03/2017  6:58 AM  Result Value Ref Range   ABO/RH(D) O POS    Antibody Screen PENDING    Sample Expiration 06/27/2017     Dg Shoulder Right  Result Date:  06/04/2017 CLINICAL DATA:  Right arm pain. EXAM: RIGHT SHOULDER - 2+ VIEW COMPARISON:  None. FINDINGS: Comminuted displaced fracture of the humeral neck. Degree of displacement is not well assessed given positioning. Glenohumeral alignment is suboptimally assessed but grossly normal. The acromioclavicular joint is congruent. IMPRESSION: Comminuted displaced humeral neck fracture. Electronically Signed   By: Jeb Levering M.D.   On: 06/16/2017 03:11   Dg Forearm Right  Result Date: 06/26/2017 CLINICAL DATA:  Right arm pain. EXAM: RIGHT FOREARM - 2 VIEW COMPARISON:  None. FINDINGS: There is no evidence of fracture or other focal bone lesions. Elbow and wrist alignment is maintained. Soft tissue edema about the dorsum of the proximal forearm. IMPRESSION: Soft tissue edema of the proximal forearm, no acute osseous abnormality. Electronically Signed   By: Jeb Levering M.D.   On: 06/10/2017 03:11   Ct Head Wo Contrast  Result Date: 06/04/2017 CLINICAL DATA:  Unwitnessed fall EXAM: CT HEAD WITHOUT CONTRAST TECHNIQUE: Contiguous axial images were obtained from the base of the skull through the vertex without intravenous contrast. COMPARISON:  05/27/2015 FINDINGS: Motion degraded images. Brain: No evidence of acute infarction, hemorrhage, hydrocephalus, extra-axial collection or mass lesion/mass effect. Mild cortical atrophy. Subcortical white matter and periventricular small vessel ischemic changes. Vascular: No hyperdense vessel or unexpected calcification. Skull: Normal. Negative for fracture or focal lesion. Sinuses/Orbits: The visualized paranasal sinuses are essentially clear. The mastoid air cells are unopacified. Other: None. IMPRESSION: Motion degraded images. No evidence of acute intracranial abnormality. Atrophy with small vessel ischemic changes. Electronically Signed   By: Julian Hy M.D.   On: 06/03/2017 03:13   Dg Abd Acute W/chest  Result Date: 06/30/2017 CLINICAL DATA:  Abdominal  pain, constipation EXAM: DG ABDOMEN ACUTE W/ 1V CHEST COMPARISON:  Chest radiographs dated 06/06/2013. CT abdomen/pelvis dated 4 II 1,013. FINDINGS: Lungs are clear. No Ad Guttman interstitial edema. No pleural effusion or pneumothorax. Cardiomegaly. Nonobstructive bowel gas pattern. Mild gaseous distention of bowel in the left mid abdomen raises the possibility of adynamic ileus. Normal colonic stool burden. No evidence of free air on the lateral decubitus view. Cholecystectomy clips. IMPRESSION: No evidence of acute cardiopulmonary disease. No evidence of small bowel obstruction or free air. Possible mild adynamic ileus. Normal  colonic stool burden. Electronically Signed   By: Julian Hy M.D.   On: 06/10/2017 03:11    ROS Blood pressure (!) 179/70, pulse (!) 59, temperature 98.7 F (37.1 C), temperature source Oral, resp. rate (!) 22, SpO2 91 %. Physical Exam Physical Examination: General appearance - alert, well appearing, and in no distress Mental status - alert, oriented to person Right upper extremity in sling, tender proximal arm, no bruising or laceration, able to move elbow,wrist ,fingers without difficulty, 2 + radial and ulnar pulse, sensation intact  X-ray- Displaced, comminuted proximal humerus fracture with possible fracture of humeral head   Assessment/Plan: Right proximal humerus fracture- Treatment will be dependent on status of humeral head. Will need CT scan to better assess extent of injury. Will most likely require surgical fixation. I have discussed the case with Dr. Veverly Fells, our shoulder specialist who will see the patient later today and determine further treatment plan.  Eduardo Herman 06/27/2017, 7:45 AM

## 2017-06-24 NOTE — ED Notes (Signed)
Report given to [facility] on 6E. Patient has order in for CT of shoulder. Will send patient to floor after CT.

## 2017-06-24 NOTE — Progress Notes (Signed)
Orthopedic Tech Progress Note Patient Details:  ALDAHIR LITAKER 1951-01-18 [id number]   OHF w/ Trapeze, Shoulder Immobilizer applied by ED staff. Patient ID: Eduardo Herman, male   DOB: 06-Apr-1951, 66 y.o.   MRN: [id number]   Charlott Rakes 06/21/2017, 11:55 AM

## 2017-06-24 NOTE — H&P (Signed)
History and Physical    Eduardo Herman WGN:[id number] DOB: Feb 23, 1951 DOA: 06/12/2017  Referring MD/NP/PA:   PCP: Leonard Downing, MD   Patient coming from:  The patient is coming from home.  At baseline, pt is dependent for most of ADL.   Chief Complaint: fall, right shoulder pain, abdominal pain and distention  HPI: Eduardo Herman is a 66 y.o. male with medical history significant of Down syndrome, mental retardation, morbid obesity, hypothyroidism, PVD, tobacco abuse, CK-3, chronic venous insufficiency in legs, who presents with fall, right shoulder pain, abdominal pain and distention.  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  Per report, patient has been having abdominal pain, abdominal distention and constipation for about 3 days. He has decreased oral intake. Per report, his father reported that pt fell and his closed doorknob when he had gotten up last night. Pt was found lying on the floor on his back. He is not very verbal, with grimacing in pain to right shoulder. He moves all extremities. He does not seem to have chest pain, cough. No fever or chills.  ED Course: pt was found to have WBC 17.2, negative troponin, urinalysis positive for trace amount of leukocytes, slightly worsening renal function, potassium 3.3, x-ray of the right forearm negative. X-ray of acute abdomen/chest negative. X-ray of right shoulder showed comminuted and displaced humeral neck fracture. CT head is negative for acute intracranial abnormalities. Pt is admitted to [facility] bed as inpatient.  Review of Systems: Could not be reviewed accurately due to her mental retardation.  Allergy:  Allergies  Allergen Reactions  . Lisinopril Swelling    Angioedema of the tongue    Past Medical History:  Diagnosis Date  .  Angioedema of lips    likely secondary to ACE inhibitor.   . Down's syndrome   . Hypertension   . Hypothyroidism   . Mental disorder   . Peripheral vascular disease ([place])   . Shortness of breath     Past Surgical History:  Procedure Laterality Date  . CHOLECYSTECTOMY      Social History:  reports that he has been smoking Cigarettes.  He has never used smokeless tobacco. He reports that he does not drink alcohol or use drugs.  Family History: Could not be reviewed accurately due to mental retardation  Prior to Admission medications   Medication Sig Start Date End Date Taking? Authorizing Provider  atenolol (TENORMIN) 100 MG tablet Take 100 mg by mouth daily.    [provider]  diltiazem (CARDIZEM CD) 360 MG 24 hr capsule Take 1 capsule (360 mg total) by mouth daily. 06/10/15   Eugenie Filler, MD  furosemide (LASIX) 80 MG tablet Take 0.5 tablets (40 mg total) by mouth daily. Resume in 2 days 06/12/15   Eugenie Filler, MD  levothyroxine (SYNTHROID, LEVOTHROID) 200 MCG tablet Take 200-300 mcg by mouth daily. 200 mcg Monday, Wednesday, and Friday.  300 mcg Tuesday, Thursday, Saturday, and Sunday.    [provider]  Potassium Chloride ER 20 MEQ TBCR Take 20 mEq by mouth 3 (three) times daily. 12/11/13   Vicie Mutters, PA-C    Physical Exam: Vitals:   06/25/2017 0114 06/06/2017 0316  BP: (!) 148/87 (!) 180/74  Pulse: 68 63  Resp: 20 (!) 22  Temp: 98.8 F (37.1 C)  TempSrc: Oral   SpO2: 92% 91%   General:  In moderate acute distress due to pain to right shoulder HEENT:       Eyes: PERRL, EOMI, no scleral icterus.       ENT: No discharge from the ears and nose, no pharynx injection, no tonsillar enlargement.        Neck: No JVD, no bruit, no mass felt. Heme: No neck lymph node enlargement. Cardiac: S1/S2, RRR, No murmurs, No gallops or rubs. Respiratory: No rales, wheezing, rhonchi or rubs. GI: distended and diffusely tender, no organomegaly, BS  present. GU: No hematuria Ext: 1+ pitting leg edema bilaterally and chronic venous insufficiency in legs. 2+DP/PT pulse bilaterally. Musculoskeletal: has tenderness in right shoulder. Skin: No rashes.  Neuro: has mental retardation, not oriented X3, cranial nerves II-XII grossly intact, moves all extremities normally. Psych: Patient is not psychotic, no suicidal or hemocidal ideation.  Labs on Admission: I have personally reviewed following labs and imaging studies  CBC:  Recent Labs Lab 06/03/2017 0352  WBC 17.2*  NEUTROABS 13.9*  HGB 15.1  HCT 44.8  MCV 88.0  PLT 094   Basic Metabolic Panel:  Recent Labs Lab 06/13/2017 0352  NA 140  K 3.3*  CL 108  CO2 24  GLUCOSE 159*  BUN 23*  CREATININE 2.13*  CALCIUM 8.3*   GFR: CrCl cannot be calculated (Unknown ideal weight.). Liver Function Tests: No results for input(s): AST, ALT, ALKPHOS, BILITOT, PROT, ALBUMIN in the last 168 hours. No results for input(s): LIPASE, AMYLASE in the last 168 hours. No results for input(s): AMMONIA in the last 168 hours. Coagulation Profile: No results for input(s): INR, PROTIME in the last 168 hours. Cardiac Enzymes:  Recent Labs Lab 06/23/2017 0353  TROPONINI <0.03   BNP (last 3 results) No results for input(s): PROBNP in the last 8760 hours. HbA1C: No results for input(s): HGBA1C in the last 72 hours. CBG: No results for input(s): GLUCAP in the last 168 hours. Lipid Profile: No results for input(s): CHOL, HDL, LDLCALC, TRIG, CHOLHDL, LDLDIRECT in the last 72 hours. Thyroid Function Tests: No results for input(s): TSH, T4TOTAL, FREET4, T3FREE, THYROIDAB in the last 72 hours. Anemia Panel: No results for input(s): VITAMINB12, FOLATE, FERRITIN, TIBC, IRON, RETICCTPCT in the last 72 hours. Urine analysis:    Component Value Date/Time   COLORURINE YELLOW 06/13/2017 0340   APPEARANCEUR HAZY (A) 06/13/2017 0340   LABSPEC 1.021 06/30/2017 0340   PHURINE 5.0 06/30/2017 0340   GLUCOSEU  NEGATIVE 06/21/2017 0340   HGBUR MODERATE (A) 06/25/2017 0340   BILIRUBINUR NEGATIVE 06/30/2017 0340   KETONESUR 5 (A) 06/21/2017 0340   PROTEINUR 100 (A) 06/07/2017 0340   UROBILINOGEN 1.0 05/28/2015 2016   NITRITE NEGATIVE 06/28/2017 0340   LEUKOCYTESUR TRACE (A) 06/29/2017 0340   Sepsis Labs: @LABRCNTIP (procalcitonin:4,lacticidven:4) )No results found for this or any previous visit (from the past 240 hour(s)).   Radiological Exams on Admission: Dg Shoulder Right  Result Date: 06/21/2017 CLINICAL DATA:  Right arm pain. EXAM: RIGHT SHOULDER - 2+ VIEW COMPARISON:  None. FINDINGS: Comminuted displaced fracture of the humeral neck. Degree of displacement is not well assessed given positioning. Glenohumeral alignment is suboptimally assessed but grossly normal. The acromioclavicular joint is congruent. IMPRESSION: Comminuted displaced humeral neck fracture. Electronically Signed   By: Jeb Levering M.D.   On: 06/04/2017 03:11   Dg Forearm Right  Result Date: 06/09/2017 CLINICAL DATA:  Right arm pain. EXAM: RIGHT FOREARM - 2 VIEW COMPARISON:  None. FINDINGS: There  is no evidence of fracture or other focal bone lesions. Elbow and wrist alignment is maintained. Soft tissue edema about the dorsum of the proximal forearm. IMPRESSION: Soft tissue edema of the proximal forearm, no acute osseous abnormality. Electronically Signed   By: Jeb Levering M.D.   On: 06/17/2017 03:11   Ct Head Wo Contrast  Result Date: 06/09/2017 CLINICAL DATA:  Unwitnessed fall EXAM: CT HEAD WITHOUT CONTRAST TECHNIQUE: Contiguous axial images were obtained from the base of the skull through the vertex without intravenous contrast. COMPARISON:  05/27/2015 FINDINGS: Motion degraded images. Brain: No evidence of acute infarction, hemorrhage, hydrocephalus, extra-axial collection or mass lesion/mass effect. Mild cortical atrophy. Subcortical white matter and periventricular small vessel ischemic changes. Vascular: No  hyperdense vessel or unexpected calcification. Skull: Normal. Negative for fracture or focal lesion. Sinuses/Orbits: The visualized paranasal sinuses are essentially clear. The mastoid air cells are unopacified. Other: None. IMPRESSION: Motion degraded images. No evidence of acute intracranial abnormality. Atrophy with small vessel ischemic changes. Electronically Signed   By: Julian Hy M.D.   On: 06/13/2017 03:13   Dg Abd Acute W/chest  Result Date: 06/20/2017 CLINICAL DATA:  Abdominal pain, constipation EXAM: DG ABDOMEN ACUTE W/ 1V CHEST COMPARISON:  Chest radiographs dated 06/06/2013. CT abdomen/pelvis dated 4 II 1,013. FINDINGS: Lungs are clear. No frank interstitial edema. No pleural effusion or pneumothorax. Cardiomegaly. Nonobstructive bowel gas pattern. Mild gaseous distention of bowel in the left mid abdomen raises the possibility of adynamic ileus. Normal colonic stool burden. No evidence of free air on the lateral decubitus view. Cholecystectomy clips. IMPRESSION: No evidence of acute cardiopulmonary disease. No evidence of small bowel obstruction or free air. Possible mild adynamic ileus. Normal colonic stool burden. Electronically Signed   By: Julian Hy M.D.   On: 06/05/2017 03:11     EKG: Independently reviewed.  Sinus rhythm, QTC 440, LAD, PAC    Assessment/Plan Principal Problem:   Fall Active Problems:   Mental retardation   Essential hypertension, benign   CKD (chronic kidney disease), stage III   Hypokalemia   Hypothyroidism   Tobacco abuse   Fracture of humerus anatomical neck, right, closed, initial encounter   Abdominal distension   Abdominal pain   Fall and fracture of R humerus neck: As evidenced by x-ray. No neurovascular compromise. Orthopedic surgeon, Dr. Maureen Ralphs was consulted, will see patient in morning.  - will admit to Med-surg bed as inpt - Pain control: prn dilaudid and percocet - When necessary Zofran for nausea - Robaxin for muscle  spasm - type and cross - INR/PTT - NPO  Leukocytosis: Likely due to stress-induced demargination. Patient does not have fever. UA showed trace amount of leukocyte. -Follow-up CBC -f/u urine culture.  HTN: -continue atenolol, Cardizem -Hold Lasix while pt is on NPO -IV hydralazine when necessary  CKD (chronic kidney disease), stage III: Patient's baseline creatinine was 1.79 on 06/10/15, his creatinine is 2.13 today, slightly worsening. -Patient received 1 L normal saline bolus in ED -Hold her Lasix -Follow up renal function. Edema  Hypokalemia: K= 3.3 on admission. - Repleted  Hypothyroidism: Last TSH was 0.814 on 11/28/13 -Continue home Synthroid  Tobacco abuse: -Did counseling about importance of quitting smoking -Nicotine patch  Abdominal distension and Abdominal pain and constipation: Etiology is not clear. X-ray of acute abdomen/chest is negative. -Will get CT abdomen/pelvis without contrast to rule out bowel obstruction -MiraLAX and senokot -check lipase   DVT ppx: SCD Code Status: Full code Family Communication: None at bed side.  Disposition Plan:  Anticipate discharge back to previous rehabilitation facility  Consults called:   Admission status: medical floor/obs     Date of Service 06/14/2017    Ivor Costa [facility] Pager [phone]  If 7PM-7AM, please contact night-coverage [email or web address] Password [facility] 06/06/2017, 6:13 AM

## 2017-06-24 NOTE — ED Notes (Signed)
Pt communication is limited based on MR. hospital ist had difficulty communicating with pt

## 2017-06-24 NOTE — ED Provider Notes (Signed)
[facility] DEPT Provider Note   CSN: [id number] Arrival date & time: 06/04/2017  0109     History   Chief Complaint Chief Complaint  Patient presents with  . Shoulder Injury  . Hand Injury   LEVEL 5 CAVEAT DUE TO ALTERED MENTAL STATUS HPI Eduardo Herman is a 66 y.o. male.  The history is provided by the patient and a parent. The history is limited by the condition of the patient.  Shoulder Injury  This is a new problem. Episode onset: just prior to arrival. The problem occurs constantly. The problem has been gradually worsening. Exacerbated by: movement. The symptoms are relieved by rest.  Hand Injury    patient with h/o morbid obesity, MR, presents with fall Per father, he heard patient fall tonight and reported right shoulder pain He had difficulty standing up so EMS was called Per father, pt has had decreased PO recently and is also constipated.   No acute change in mental status It is unclear if patient hit his head   Past Medical History:  Diagnosis Date  . Angioedema of lips    likely secondary to ACE inhibitor.   . Down's syndrome   . Hypertension   . Hypothyroidism   . Mental disorder   . Peripheral vascular disease ([place])   . Shortness of breath     Patient Active Problem List   Diagnosis Date Noted  . Hypothyroidism 06/10/2015  . Hypokalemia   . CKD (chronic kidney disease), stage III 06/04/2015  . AKI (acute kidney injury) ([place]) 06/04/2015  . Cellulitis of right leg 05/27/2015  . Essential hypertension, benign 11/28/2013  . Prediabetes 11/28/2013  . Hyperlipemia 11/28/2013  . Angioedema 11/04/2012  . Edema, lower extremity 11/04/2012  . Benign carcinoid tumor of the duodenum 03/26/2010  . Mental retardation 03/26/2010    Past Surgical History:  Procedure Laterality Date  . CHOLECYSTECTOMY         Home Medications    Prior to Admission medications   Medication Sig Start Date End Date Taking? Authorizing Provider  atenolol (TENORMIN) 100  MG tablet Take 100 mg by mouth daily.    [provider]  diltiazem (CARDIZEM CD) 360 MG 24 hr capsule Take 1 capsule (360 mg total) by mouth daily. 06/10/15   Eugenie Filler, MD  furosemide (LASIX) 80 MG tablet Take 0.5 tablets (40 mg total) by mouth daily. Resume in 2 days 06/12/15   Eugenie Filler, MD  levothyroxine (SYNTHROID, LEVOTHROID) 200 MCG tablet Take 200-300 mcg by mouth daily. 200 mcg Monday, Wednesday, and Friday.  300 mcg Tuesday, Thursday, Saturday, and Sunday.    [provider]  Potassium Chloride ER 20 MEQ TBCR Take 20 mEq by mouth 3 (three) times daily. 12/11/13   Vicie Mutters, PA-C    Family History No family history on file.  Social History Social History  Substance Use Topics  . Smoking status: Current Every Day Smoker    Types: Cigarettes  . Smokeless tobacco: Never Used     Comment: Father states he smokes 2-3 cigarettes a day  . Alcohol use No     Allergies   Lisinopril   Review of Systems Review of Systems  Unable to perform ROS: Mental status change     Physical Exam Updated Vital Signs BP (!) 148/87 (BP Location: Left Arm)   Pulse 68   Temp 98.8 F (37.1 C) (Oral)   Resp 20   SpO2 92%   Physical Exam CONSTITUTIONAL: elderly, chronically  ill appearing HEAD: Normocephalic/atraumatic, no bruising noted EYES: EOMI/PERRL ENMT: Mucous membranes moist NECK: supple no meningeal signs SPINE/BACK:entire spine nontender, No bruising/crepitance/stepoffs noted to spine CV: S1/S2 noted LUNGS: Lungs are clear to auscultation bilaterally, tachypneic ABDOMEN: soft, obese, distended, mild tenderness, no large hernia noted, no discoloration noted GU:no bruising/erythema noted, nurse present for exam NEURO: Pt is awake/alert, he will answer some questions.  No focal weakness noted.   EXTREMITIES: pulses normal/equal,tenderness to palpation of right shoulder and right forearm.  All other extremities/joints palpated/ranged and  nontender.  Pelvis stable.  No tenderness with ROM of either hip SKIN: warm, color normal PSYCH: unable to assess  ED Treatments / Results  Labs (all labs ordered are listed, but only abnormal results are displayed) Labs Reviewed  BASIC METABOLIC PANEL - Abnormal; Notable for the following:       Result Value   Potassium 3.3 (*)    Glucose, Bld 159 (*)    BUN 23 (*)    Creatinine, Ser 2.13 (*)    Calcium 8.3 (*)    GFR calc non Af Amer 30 (*)    GFR calc Af Amer 35 (*)    All other components within normal limits  CBC WITH DIFFERENTIAL/PLATELET - Abnormal; Notable for the following:    WBC 17.2 (*)    Neutro Abs 13.9 (*)    Monocytes Absolute 2.1 (*)    All other components within normal limits  URINALYSIS, ROUTINE W REFLEX MICROSCOPIC - Abnormal; Notable for the following:    APPearance HAZY (*)    Hgb urine dipstick MODERATE (*)    Ketones, ur 5 (*)    Protein, ur 100 (*)    Leukocytes, UA TRACE (*)    Bacteria, UA RARE (*)    Squamous Epithelial / LPF 0-5 (*)    All other components within normal limits  URINE CULTURE  TROPONIN I  PROTIME-INR  APTT  BRAIN NATRIURETIC PEPTIDE  CK  TYPE AND SCREEN    EKG  EKG Interpretation  Date/Time:  Friday June 24 2017 03:11:36 EDT Ventricular Rate:  65 PR Interval:    QRS Duration: 120 QT Interval:  423 QTC Calculation: 440 R Axis:   -36 Text Interpretation:  Sinus rhythm Atrial premature complex Nonspecific IVCD with LAD Nonspecific T abnrm, anterolateral leads Interpretation limited secondary to artifact Abnormal ekg changed from prior Confirmed by Ripley Fraise [phone]) on 06/09/2017 3:25:55 AM       Radiology Dg Shoulder Right  Result Date: 06/09/2017 CLINICAL DATA:  Right arm pain. EXAM: RIGHT SHOULDER - 2+ VIEW COMPARISON:  None. FINDINGS: Comminuted displaced fracture of the humeral neck. Degree of displacement is not well assessed given positioning. Glenohumeral alignment is suboptimally assessed but grossly  normal. The acromioclavicular joint is congruent. IMPRESSION: Comminuted displaced humeral neck fracture. Electronically Signed   By: Jeb Levering M.D.   On: 06/19/2017 03:11   Dg Forearm Right  Result Date: 06/03/2017 CLINICAL DATA:  Right arm pain. EXAM: RIGHT FOREARM - 2 VIEW COMPARISON:  None. FINDINGS: There is no evidence of fracture or other focal bone lesions. Elbow and wrist alignment is maintained. Soft tissue edema about the dorsum of the proximal forearm. IMPRESSION: Soft tissue edema of the proximal forearm, no acute osseous abnormality. Electronically Signed   By: Jeb Levering M.D.   On: 06/09/2017 03:11   Ct Head Wo Contrast  Result Date: 06/26/2017 CLINICAL DATA:  Unwitnessed fall EXAM: CT HEAD WITHOUT CONTRAST TECHNIQUE: Contiguous axial images were obtained from  the base of the skull through the vertex without intravenous contrast. COMPARISON:  05/27/2015 FINDINGS: Motion degraded images. Brain: No evidence of acute infarction, hemorrhage, hydrocephalus, extra-axial collection or mass lesion/mass effect. Mild cortical atrophy. Subcortical white matter and periventricular small vessel ischemic changes. Vascular: No hyperdense vessel or unexpected calcification. Skull: Normal. Negative for fracture or focal lesion. Sinuses/Orbits: The visualized paranasal sinuses are essentially clear. The mastoid air cells are unopacified. Other: None. IMPRESSION: Motion degraded images. No evidence of acute intracranial abnormality. Atrophy with small vessel ischemic changes. Electronically Signed   By: Julian Hy M.D.   On: 06/02/2017 03:13   Dg Abd Acute W/chest  Result Date: 06/06/2017 CLINICAL DATA:  Abdominal pain, constipation EXAM: DG ABDOMEN ACUTE W/ 1V CHEST COMPARISON:  Chest radiographs dated 06/06/2013. CT abdomen/pelvis dated 4 II 1,013. FINDINGS: Lungs are clear. No frank interstitial edema. No pleural effusion or pneumothorax. Cardiomegaly. Nonobstructive bowel gas pattern.  Mild gaseous distention of bowel in the left mid abdomen raises the possibility of adynamic ileus. Normal colonic stool burden. No evidence of free air on the lateral decubitus view. Cholecystectomy clips. IMPRESSION: No evidence of acute cardiopulmonary disease. No evidence of small bowel obstruction or free air. Possible mild adynamic ileus. Normal colonic stool burden. Electronically Signed   By: Julian Hy M.D.   On: 06/04/2017 03:11    Procedures Procedures (including critical care time)  Medications Ordered in ED   Initial Impression / Assessment and Plan / ED Course  I have reviewed the triage vital signs and the nursing notes.  Pertinent labs & imaging results that were available during my care of the patient were reviewed by me and considered in my medical decision making (see chart for details).     2:33 AM Very difficult history.  Pt has h/o MR and can not contribute much to history.  Father (who is 46) reports he heard him fall tonight and he has not been eating well and constipated Imaging/labs ordered. CT head ordered as unclear if he hit his head Will follow closely 5:51 AM Pt found to have humerus fracture - sling ordered and will need ortho consult later today He is noted to worsening renal function Will admit Pt will likely need placement in a facility due to comorbidities and family unable to care for at home Pt is resting, no distress, no focal abdominal tenderness   D/w dr Blaine Hamper for admission  Final Clinical Impressions(s) / ED Diagnoses   Final diagnoses:  AKI (acute kidney injury) ([place])  Fx humeral neck, right, closed, initial encounter    New Prescriptions New Prescriptions   No medications on file     Ripley Fraise, MD 06/14/2017 [phone]

## 2017-06-24 NOTE — ED Triage Notes (Signed)
Pt comes from home, had unwitnessed fall, right shoulder and right arm. Pt has MR and at baseline has trouble communicating. Pt fell around 12:10 tonight.  Father is Teacher, music care giver. V/s on arrival 160/90, pulse 72 reg, rr24, GCS 14. Father also concerned about the pts stomach and possible constipation.

## 2017-06-24 NOTE — ED Notes (Addendum)
Bed: RESA Expected date:  Expected time:  Means of arrival:  Comments: EMS 66 yo male right shoulder and right arm pain/constipation/non-ambulatory

## 2017-06-24 NOTE — Consult Note (Signed)
Urology Consult  Referring physician: Neldon Newport, E Reason for referral: ureter stone  Chief Complaint: ureter stone  History of Present Illness: Patient was admitted due to a fall and right shoulder pain. It was noted he had abdominal pain and distention. He fractured his right humerus. Apparently the patient will need a CT scan and possibly surgical fixation. I believe that Dr. Alma Friendly will be assessing the patient later today  The patient's serum creatinine was 2.13 x 2 years ago it was 2.2 to and as low as 1.79. The patient has an elevated white blood count noted. Urinalysis demonstrated rare bacteria and 0-5 red blood cells per high-power field  The patient had a CT scan for right lower quadrant pain. The patient had a 5 mm stone in the mid right ureter with mild proximal hydroureter. Left kidney is normal. Fat stranding located around right kidney.  The admission history was limited due to patient's mental retardation and Down syndrome.   The patient today does not complain of any abdominal pain or nausea. I asked him on a number of occasions. I spoke to the nurses and they report the same findings. When I examined his obese abdomen one could argue he may be having minimal tenderness on the right side. He denies a history of stones or previous GU surgery.  Modifying factors: There are no other modifying factors  Associated signs and symptoms: There are no other associated signs and symptoms Aggravating and relieving factors: There are no other aggravating or relieving factors Severity: Moderate Duration: Persistent  Past Medical History:  Diagnosis Date  . Angioedema of lips    likely secondary to ACE inhibitor.   . Down's syndrome   . Hypertension   . Hypothyroidism   . Mental disorder   . Peripheral vascular disease ([place])   . Shortness of breath    Past Surgical History:  Procedure Laterality Date  . CHOLECYSTECTOMY      Medications: I have reviewed the patient's current  medications. Allergies:  Allergies  Allergen Reactions  . Lisinopril Swelling    Angioedema of the tongue    No family history on file. Social History:  reports that he has been smoking Cigarettes.  He has never used smokeless tobacco. He reports that he does not drink alcohol or use drugs.  ROS: All systems are reviewed and negative except as noted. Rest negative  Physical Exam:  Vital signs in last 24 hours: Temp:  [97.7 F (36.5 C)-98.8 F (37.1 C)] 97.8 F (36.6 C) (08/24 1000) Pulse Rate:  [59-68] 65 (08/24 1000) Resp:  [18-22] 20 (08/24 1000) BP: (148-180)/(68-90) 157/68 (08/24 1000) SpO2:  [91 %-97 %] 97 % (08/24 1000)  Cardiovascular: Skin warm; not flushed Respiratory: Breaths quiet; no shortness of breath Abdomen: No masses Neurological: Normal sensation to touch Musculoskeletal: Normal motor function arms and legs Lymphatics: No inguinal adenopathy Skin: No rashes Genitourinary: The patient is obese. He had a retracted penis. Condom catheter was applied. One could argue he may been a little bit tender on the right side but was difficult to assess  Laboratory Data:  Results for orders placed or performed during the hospital encounter of 06/08/2017 (from the past 72 hour(s))  Urinalysis, Routine w reflex microscopic     Status: Abnormal   Collection Time: 06/21/2017  3:40 AM  Result Value Ref Range   Color, Urine YELLOW YELLOW   APPearance HAZY (A) CLEAR   Specific Gravity, Urine 1.021 1.005 - 1.030   pH 5.0  5.0 - 8.0   Glucose, UA NEGATIVE NEGATIVE mg/dL   Hgb urine dipstick MODERATE (A) NEGATIVE   Bilirubin Urine NEGATIVE NEGATIVE   Ketones, ur 5 (A) NEGATIVE mg/dL   Protein, ur 100 (A) NEGATIVE mg/dL   Nitrite NEGATIVE NEGATIVE   Leukocytes, UA TRACE (A) NEGATIVE   RBC / HPF 0-5 0 - 5 RBC/hpf   WBC, UA 6-30 0 - 5 WBC/hpf   Bacteria, UA RARE (A) NONE SEEN   Squamous Epithelial / LPF 0-5 (A) NONE SEEN   Mucus PRESENT   Basic metabolic panel     Status:  Abnormal   Collection Time: 06/18/2017  3:52 AM  Result Value Ref Range   Sodium 140 135 - 145 mmol/L   Potassium 3.3 (L) 3.5 - 5.1 mmol/L   Chloride 108 101 - 111 mmol/L   CO2 24 22 - 32 mmol/L   Glucose, Bld 159 (H) 65 - 99 mg/dL   BUN 23 (H) 6 - 20 mg/dL   Creatinine, Ser 2.13 (H) 0.61 - 1.24 mg/dL   Calcium 8.3 (L) 8.9 - 10.3 mg/dL   GFR calc non Af Amer 30 (L) >60 mL/min   GFR calc Af Amer 35 (L) >60 mL/min    Comment: (NOTE) The eGFR has been calculated using the CKD EPI equation. This calculation has not been validated in all clinical situations. eGFR's persistently <60 mL/min signify possible Chronic Kidney Disease.    Anion gap 8 5 - 15  CBC with Differential/Platelet     Status: Abnormal   Collection Time: 06/08/2017  3:52 AM  Result Value Ref Range   WBC 17.2 (H) 4.0 - 10.5 K/uL   RBC 5.09 4.22 - 5.81 MIL/uL   Hemoglobin 15.1 13.0 - 17.0 g/dL   HCT 44.8 39.0 - 52.0 %   MCV 88.0 78.0 - 100.0 fL   MCH 29.7 26.0 - 34.0 pg   MCHC 33.7 30.0 - 36.0 g/dL   RDW 15.4 11.5 - 15.5 %   Platelets 192 150 - 400 K/uL   Neutrophils Relative % 81 %   Neutro Abs 13.9 (H) 1.7 - 7.7 K/uL   Lymphocytes Relative 7 %   Lymphs Abs 1.1 0.7 - 4.0 K/uL   Monocytes Relative 12 %   Monocytes Absolute 2.1 (H) 0.1 - 1.0 K/uL   Eosinophils Relative 0 %   Eosinophils Absolute 0.0 0.0 - 0.7 K/uL   Basophils Relative 0 %   Basophils Absolute 0.0 0.0 - 0.1 K/uL  Troponin I     Status: None   Collection Time: 06/14/2017  3:53 AM  Result Value Ref Range   Troponin I <0.03 <0.03 ng/mL  Type and screen Wanda     Status: None   Collection Time: 06/01/2017  6:58 AM  Result Value Ref Range   ABO/RH(D) O POS    Antibody Screen NEG    Sample Expiration 06/27/2017   ABO/Rh     Status: None   Collection Time: 06/15/2017  6:58 AM  Result Value Ref Range   ABO/RH(D) O POS   Protime-INR     Status: Abnormal   Collection Time: 06/17/2017  9:45 AM  Result Value Ref Range   Prothrombin  Time 16.9 (H) 11.4 - 15.2 seconds   INR 1.36   APTT     Status: None   Collection Time: 06/28/2017  9:45 AM  Result Value Ref Range   aPTT 34 24 - 36 seconds  Brain natriuretic peptide  Status: Abnormal   Collection Time: 06/12/2017  9:45 AM  Result Value Ref Range   B Natriuretic Peptide 215.5 (H) 0.0 - 100.0 pg/mL  CK     Status: None   Collection Time: 06/13/2017  9:45 AM  Result Value Ref Range   Total CK 85 49 - 397 U/L  Lipase, blood     Status: None   Collection Time: 06/05/2017  9:45 AM  Result Value Ref Range   Lipase 18 11 - 51 U/L   No results found for this or any previous visit (from the past 240 hour(s)). Creatinine:  Recent Labs  06/09/2017 0352  CREATININE 2.13*    Xrays: See report/chart As above  Impression/Assessment:  The patient has a small right ureteral stone noted. He has perinephric stranding but I felt he had some also on the left side. His elevated creatinine appears chronic. Currently he is not showing signs of sepsis but I did take sure that the urine was sent for culture. I gave him my phone number to the nurse so hopefully I can speak to the patient's father tonight when he visits.  Plan:  The radiologist wondered if the right kidney was inflamed and/or infected. I will speak to the primary team and broad-spectrum antibiotics are not unreasonable. If he is going to have orthopedic surgery he could have a stent simultaneously. This might be a little bit safer under the circumstances in a patient who is more difficult to clinically assess. Watchful waiting and passage of the stone as an outpatient is another option. I will speak to the nurse to have orthopedics also call me at [phone]. The patient saw Dr. Jeffie Pollock a number years ago  Jolene Guyett A 06/16/2017, 12:08 PM

## 2017-06-24 NOTE — ED Notes (Signed)
Father states that patient has not been eating very much over the last 2 days and states his stomach is swollen and he has not had a bowel movement in a few days. Patient is not very verbal-grimacing in pain to right arm. Father states at [place] patient had gotten up and states he was weak and fell and hit his closet doorknob. Father states he fell in the floor and he found him lying on his back unable to get up. Father states he is ambulatory, but not much. Difficult communication per Father.

## 2017-06-24 NOTE — ED Notes (Signed)
Called floor to give report wants to wait for day shift

## 2017-06-24 NOTE — Plan of Care (Signed)
Contact information for father-home # [phone] Cell [phone]

## 2017-06-24 NOTE — Progress Notes (Signed)
Pharmacy Antibiotic Note  Eduardo Herman is a 66 y.o. male admitted on 06/11/2017 with ureter stone.  Pharmacy has been consulted for Rocephin dosing for UTI.   Plan: Rocephin 1 gm IV q24 Pharmacy to sign off   Temp (24hrs), Avg:98.1 F (36.7 C), Min:97.5 F (36.4 C), Max:98.8 F (37.1 C)   Recent Labs Lab 06/15/2017 0352  WBC 17.2*  CREATININE 2.13*    CrCl cannot be calculated (Unknown ideal weight.).    Allergies  Allergen Reactions  . Lisinopril Swelling    Angioedema of the tongue    Thank you for allowing pharmacy to be a part of this patient's care.  Eudelia Bunch, Pharm.D. [phone] 06/17/2017 1:32 PM

## 2017-06-24 NOTE — Progress Notes (Signed)
[facility]   Patient admitted after midnight see H&P for further details.  Patient seen, evaluated and chart has been reviewed   Patient is a 66-year-old male with medical history significant of Down syndrome, MR, hypothyroidism, chronic kidney disease stage III chronic venous insufficiency who presented to the emergency department after mechanical fall and right shoulder pain. Patient was found to have shoulder fracture orthopedic surgery was consulted and is planning to perform surgery pending shoulder specialist evaluation. CT of the abdomen was done due to abdominal pain she revealed a 5 mm kidney stone with possible pyelonephritis. Patient clinically doesn't look like he does have pyelonephritis but white count is elevated. Urology has been consulted. Case was discussed and agreed upon to start patient on antibiotic given white count and kidney stone. Patient also has been started on IV fluids, and Flomax.  Rest per H&P   Chipper Oman, MD

## 2017-06-24 NOTE — ED Notes (Signed)
Patient transported to CT 

## 2017-06-25 ENCOUNTER — Encounter (HOSPITAL_COMMUNITY): Payer: Self-pay | Admitting: Anesthesiology

## 2017-06-25 ENCOUNTER — Inpatient Hospital Stay (HOSPITAL_COMMUNITY): Payer: Medicare Other

## 2017-06-25 ENCOUNTER — Inpatient Hospital Stay (HOSPITAL_COMMUNITY): Payer: Medicare Other | Admitting: Certified Registered Nurse Anesthetist

## 2017-06-25 ENCOUNTER — Encounter (HOSPITAL_COMMUNITY): Admission: EM | Disposition: E | Payer: Self-pay | Source: Home / Self Care | Attending: Family Medicine

## 2017-06-25 DIAGNOSIS — Z96611 Presence of right artificial shoulder joint: Secondary | ICD-10-CM

## 2017-06-25 DIAGNOSIS — N200 Calculus of kidney: Secondary | ICD-10-CM

## 2017-06-25 HISTORY — PX: CYSTOSCOPY WITH RETROGRADE PYELOGRAM, URETEROSCOPY AND STENT PLACEMENT: SHX5789

## 2017-06-25 HISTORY — PX: ORIF HUMERUS FRACTURE: SHX2126

## 2017-06-25 LAB — BASIC METABOLIC PANEL
ANION GAP: 6 (ref 5–15)
BUN: 26 mg/dL — AB (ref 6–20)
CALCIUM: 7.9 mg/dL — AB (ref 8.9–10.3)
CO2: 25 mmol/L (ref 22–32)
CREATININE: 1.69 mg/dL — AB (ref 0.61–1.24)
Chloride: 109 mmol/L (ref 101–111)
GFR calc Af Amer: 47 mL/min — ABNORMAL LOW (ref >60)
GFR, EST NON AFRICAN AMERICAN: 40 mL/min — AB (ref >60)
Glucose, Bld: 131 mg/dL — ABNORMAL HIGH (ref 65–99)
Potassium: 3.3 mmol/L — ABNORMAL LOW (ref 3.5–5.1)
Sodium: 140 mmol/L (ref 135–145)

## 2017-06-25 LAB — CBC WITH DIFFERENTIAL/PLATELET
BASOS ABS: 0 10*3/uL (ref 0.0–0.1)
BASOS PCT: 0 %
EOS PCT: 1 %
Eosinophils Absolute: 0.1 10*3/uL (ref 0.0–0.7)
HEMATOCRIT: 39.9 % (ref 39.0–52.0)
Hemoglobin: 13.2 g/dL (ref 13.0–17.0)
Lymphocytes Relative: 10 %
Lymphs Abs: 1.1 10*3/uL (ref 0.7–4.0)
MCH: 29.1 pg (ref 26.0–34.0)
MCHC: 33.1 g/dL (ref 30.0–36.0)
MCV: 88.1 fL (ref 78.0–100.0)
MONO ABS: 1.4 10*3/uL — AB (ref 0.1–1.0)
MONOS PCT: 14 %
NEUTROS ABS: 7.9 10*3/uL — AB (ref 1.7–7.7)
Neutrophils Relative %: 75 %
PLATELETS: 175 10*3/uL (ref 150–400)
RBC: 4.53 MIL/uL (ref 4.22–5.81)
RDW: 15.6 % — AB (ref 11.5–15.5)
WBC: 10.5 10*3/uL (ref 4.0–10.5)

## 2017-06-25 LAB — URINALYSIS, ROUTINE W REFLEX MICROSCOPIC
BACTERIA UA: NONE SEEN
BILIRUBIN URINE: NEGATIVE
Glucose, UA: NEGATIVE mg/dL
Ketones, ur: NEGATIVE mg/dL
Nitrite: NEGATIVE
PH: 6 (ref 5.0–8.0)
Protein, ur: 100 mg/dL — AB
SPECIFIC GRAVITY, URINE: 1.03 (ref 1.005–1.030)
Squamous Epithelial / LPF: NONE SEEN

## 2017-06-25 SURGERY — OPEN REDUCTION INTERNAL FIXATION (ORIF) PROXIMAL HUMERUS FRACTURE
Anesthesia: General | Site: Ureter | Laterality: Right

## 2017-06-25 MED ORDER — FENTANYL CITRATE (PF) 100 MCG/2ML IJ SOLN
INTRAMUSCULAR | Status: AC
  Filled 2017-06-25: qty 2

## 2017-06-25 MED ORDER — METOCLOPRAMIDE HCL 5 MG/ML IJ SOLN: 5.0000 mg | Freq: Three times a day (TID) | INTRAMUSCULAR | Status: DC | PRN

## 2017-06-25 MED ORDER — BISACODYL 10 MG RE SUPP: 10.0000 mg | Freq: Every day | RECTAL | Status: DC | PRN

## 2017-06-25 MED ORDER — SODIUM CHLORIDE 0.9 % IR SOLN
Status: DC | PRN
  Administered 2017-06-25: 4000 mL

## 2017-06-25 MED ORDER — ROCURONIUM BROMIDE 10 MG/ML (PF) SYRINGE
PREFILLED_SYRINGE | INTRAVENOUS | Status: DC | PRN
  Administered 2017-06-25: 50 mg via INTRAVENOUS

## 2017-06-25 MED ORDER — CEFAZOLIN SODIUM-DEXTROSE 2-4 GM/100ML-% IV SOLN: 2.0000 g | Freq: Four times a day (QID) | INTRAVENOUS | Status: DC

## 2017-06-25 MED ORDER — LACTATED RINGERS IV SOLN
INTRAVENOUS | Status: DC | PRN
  Administered 2017-06-25 (×2): via INTRAVENOUS

## 2017-06-25 MED ORDER — DOCUSATE SODIUM 100 MG PO CAPS
100.0000 mg | ORAL_CAPSULE | Freq: Two times a day (BID) | ORAL | Status: DC
  Administered 2017-06-25 – 2017-06-29 (×6): 100 mg via ORAL
  Filled 2017-06-25 (×7): qty 1

## 2017-06-25 MED ORDER — GLYCOPYRROLATE 0.2 MG/ML IV SOSY
PREFILLED_SYRINGE | INTRAVENOUS | Status: DC | PRN
  Administered 2017-06-25: .3 mg via INTRAVENOUS

## 2017-06-25 MED ORDER — METHOCARBAMOL 1000 MG/10ML IJ SOLN
500.0000 mg | Freq: Four times a day (QID) | INTRAVENOUS | Status: DC | PRN
  Filled 2017-06-25: qty 5

## 2017-06-25 MED ORDER — BUPIVACAINE-EPINEPHRINE (PF) 0.5% -1:200000 IJ SOLN
INTRAMUSCULAR | Status: AC
  Filled 2017-06-25: qty 30

## 2017-06-25 MED ORDER — POTASSIUM CHLORIDE CRYS ER 20 MEQ PO TBCR
20.0000 meq | EXTENDED_RELEASE_TABLET | Freq: Every day | ORAL | Status: DC
  Administered 2017-06-26 – 2017-06-29 (×4): 20 meq via ORAL
  Filled 2017-06-25 (×4): qty 1

## 2017-06-25 MED ORDER — SUGAMMADEX SODIUM 200 MG/2ML IV SOLN
INTRAVENOUS | Status: AC
  Filled 2017-06-25: qty 4

## 2017-06-25 MED ORDER — PROPOFOL 10 MG/ML IV BOLUS
INTRAVENOUS | Status: DC | PRN
  Administered 2017-06-25: 130 mg via INTRAVENOUS

## 2017-06-25 MED ORDER — FENTANYL CITRATE (PF) 250 MCG/5ML IJ SOLN
INTRAMUSCULAR | Status: AC
  Filled 2017-06-25: qty 5

## 2017-06-25 MED ORDER — SUGAMMADEX SODIUM 200 MG/2ML IV SOLN
INTRAVENOUS | Status: DC | PRN
  Administered 2017-06-25: 200 mg via INTRAVENOUS

## 2017-06-25 MED ORDER — LEVOTHYROXINE SODIUM 25 MCG PO TABS: 25.0000 ug | ORAL_TABLET | Freq: Every day | ORAL | Status: DC

## 2017-06-25 MED ORDER — DEXTROSE 5 % IV SOLN
3.0000 g | INTRAVENOUS | Status: AC
  Administered 2017-06-25: 3 g via INTRAVENOUS
  Filled 2017-06-25: qty 3

## 2017-06-25 MED ORDER — LIDOCAINE 2% (20 MG/ML) 5 ML SYRINGE
INTRAMUSCULAR | Status: DC | PRN
  Administered 2017-06-25: 30 mg via INTRAVENOUS

## 2017-06-25 MED ORDER — OMEGA-3-ACID ETHYL ESTERS 1 G PO CAPS
1.0000 g | ORAL_CAPSULE | Freq: Every day | ORAL | Status: DC
  Administered 2017-06-26 – 2017-06-29 (×4): 1 g via ORAL
  Filled 2017-06-25 (×5): qty 1

## 2017-06-25 MED ORDER — BACITRACIN ZINC 500 UNIT/GM EX OINT
TOPICAL_OINTMENT | CUTANEOUS | Status: AC
  Filled 2017-06-25: qty 28.35

## 2017-06-25 MED ORDER — MENTHOL 3 MG MT LOZG: 1.0000 | LOZENGE | OROMUCOSAL | Status: DC | PRN

## 2017-06-25 MED ORDER — ONDANSETRON HCL 4 MG/2ML IJ SOLN
4.0000 mg | Freq: Four times a day (QID) | INTRAMUSCULAR | Status: DC | PRN
  Administered 2017-06-28: 4 mg via INTRAVENOUS
  Filled 2017-06-25: qty 2

## 2017-06-25 MED ORDER — FENTANYL CITRATE (PF) 100 MCG/2ML IJ SOLN
INTRAMUSCULAR | Status: DC | PRN
  Administered 2017-06-25 (×7): 50 ug via INTRAVENOUS

## 2017-06-25 MED ORDER — ONDANSETRON HCL 4 MG PO TABS: 4.0000 mg | ORAL_TABLET | Freq: Four times a day (QID) | ORAL | Status: DC | PRN

## 2017-06-25 MED ORDER — EPHEDRINE 5 MG/ML INJ
INTRAVENOUS | Status: AC
  Filled 2017-06-25: qty 10

## 2017-06-25 MED ORDER — GLYCOPYRROLATE 0.2 MG/ML IV SOSY
PREFILLED_SYRINGE | INTRAVENOUS | Status: AC
  Filled 2017-06-25: qty 5

## 2017-06-25 MED ORDER — ACETAMINOPHEN 650 MG RE SUPP
650.0000 mg | Freq: Four times a day (QID) | RECTAL | Status: DC | PRN
  Administered 2017-07-01 (×2): 650 mg via RECTAL
  Filled 2017-06-25 (×3): qty 1

## 2017-06-25 MED ORDER — DEXAMETHASONE SODIUM PHOSPHATE 10 MG/ML IJ SOLN
INTRAMUSCULAR | Status: DC | PRN
  Administered 2017-06-25: 10 mg via INTRAVENOUS

## 2017-06-25 MED ORDER — DILTIAZEM HCL ER COATED BEADS 180 MG PO CP24
360.0000 mg | ORAL_CAPSULE | Freq: Every day | ORAL | Status: DC
  Administered 2017-06-25 – 2017-06-28 (×4): 360 mg via ORAL
  Filled 2017-06-25 (×6): qty 2

## 2017-06-25 MED ORDER — PHENOL 1.4 % MT LIQD: 1.0000 | OROMUCOSAL | Status: DC | PRN

## 2017-06-25 MED ORDER — PHENYLEPHRINE HCL 10 MG/ML IJ SOLN
INTRAMUSCULAR | Status: AC
  Filled 2017-06-25: qty 1

## 2017-06-25 MED ORDER — DILTIAZEM HCL ER BEADS 240 MG PO CP24: 360.0000 mg | ORAL_CAPSULE | Freq: Every day | ORAL | Status: DC

## 2017-06-25 MED ORDER — METOCLOPRAMIDE HCL 5 MG PO TABS: 5.0000 mg | ORAL_TABLET | Freq: Three times a day (TID) | ORAL | Status: DC | PRN

## 2017-06-25 MED ORDER — BUPIVACAINE-EPINEPHRINE 0.5% -1:200000 IJ SOLN
INTRAMUSCULAR | Status: DC | PRN
  Administered 2017-06-25: 8 mL

## 2017-06-25 MED ORDER — PROPOFOL 10 MG/ML IV BOLUS
INTRAVENOUS | Status: AC
  Filled 2017-06-25: qty 20

## 2017-06-25 MED ORDER — SUCCINYLCHOLINE CHLORIDE 20 MG/ML IJ SOLN
INTRAMUSCULAR | Status: DC | PRN
  Administered 2017-06-25: 140 mg via INTRAVENOUS

## 2017-06-25 MED ORDER — SODIUM CHLORIDE 0.9 % IV SOLN
INTRAVENOUS | Status: DC
  Administered 2017-06-25: via INTRAVENOUS
  Administered 2017-06-27: 50 mL/h via INTRAVENOUS

## 2017-06-25 MED ORDER — POLYETHYLENE GLYCOL 3350 17 G PO PACK: 17.0000 g | PACK | Freq: Every day | ORAL | Status: DC | PRN

## 2017-06-25 MED ORDER — MIDAZOLAM HCL 5 MG/5ML IJ SOLN
INTRAMUSCULAR | Status: DC | PRN
  Administered 2017-06-25: 2 mg via INTRAVENOUS

## 2017-06-25 MED ORDER — ACETAMINOPHEN 325 MG PO TABS
650.0000 mg | ORAL_TABLET | Freq: Four times a day (QID) | ORAL | Status: DC | PRN
  Administered 2017-06-27 – 2017-06-30 (×2): 650 mg via ORAL
  Filled 2017-06-25: qty 2

## 2017-06-25 MED ORDER — MIDAZOLAM HCL 2 MG/2ML IJ SOLN
INTRAMUSCULAR | Status: AC
  Filled 2017-06-25: qty 2

## 2017-06-25 MED ORDER — METHOCARBAMOL 500 MG PO TABS: 500.0000 mg | ORAL_TABLET | Freq: Three times a day (TID) | ORAL | 1 refills | Status: AC | PRN

## 2017-06-25 MED ORDER — HYDROCODONE-ACETAMINOPHEN 5-325 MG PO TABS: 1.0000 | ORAL_TABLET | Freq: Four times a day (QID) | ORAL | 0 refills | Status: AC | PRN

## 2017-06-25 MED ORDER — EPHEDRINE SULFATE-NACL 50-0.9 MG/10ML-% IV SOSY
PREFILLED_SYRINGE | INTRAVENOUS | Status: DC | PRN
  Administered 2017-06-25: 10 mg via INTRAVENOUS

## 2017-06-25 MED ORDER — METHOCARBAMOL 500 MG PO TABS: 500.0000 mg | ORAL_TABLET | Freq: Four times a day (QID) | ORAL | Status: DC | PRN

## 2017-06-25 MED ORDER — MORPHINE SULFATE (PF) 2 MG/ML IV SOLN: 2.0000 mg | INTRAVENOUS | Status: DC | PRN

## 2017-06-25 SURGICAL SUPPLY — 75 items
BAG SPEC THK2 15X12 ZIP CLS (MISCELLANEOUS) ×2
BAG URO CATCHER STRL LF (MISCELLANEOUS) ×4 IMPLANT
BAG ZIPLOCK 12X15 (MISCELLANEOUS) ×4 IMPLANT
BASKET ZERO TIP NITINOL 2.4FR (BASKET) IMPLANT
BIT DRILL 170X2.5X (BIT) IMPLANT
BIT DRL 170X2.5X (BIT) ×2
BOOTIES KNEE HIGH SLOAN (MISCELLANEOUS) ×4 IMPLANT
BOWL SMART MIX CTS (DISPOSABLE) ×2 IMPLANT
BSKT STON RTRVL ZERO TP 2.4FR (BASKET)
CAPT SHLDR REVTOTAL 1 ×2 IMPLANT
CEMENT HV SMART SET (Cement) ×2 IMPLANT
CLOSURE WOUND 1/2 X4 (GAUZE/BANDAGES/DRESSINGS) ×1
COVER FOOT SWITCH UNIV DISP (DRAPES) IMPLANT
COVER SURGICAL LIGHT HANDLE (MISCELLANEOUS) ×8 IMPLANT
CUP STAND PE 42 PLUS 9MM (Orthopedic Implant) ×4 IMPLANT
CUP STD PE 42 PLUS 9MM (Orthopedic Implant) IMPLANT
DECANTER SPIKE VIAL GLASS SM (MISCELLANEOUS) ×4 IMPLANT
DRAPE POUCH INSTRU U-SHP 10X18 (DRAPES) ×2 IMPLANT
DRAPE SURG 17X11 SM STRL (DRAPES) ×2 IMPLANT
DRAPE U-SHAPE 47X51 STRL (DRAPES) ×4 IMPLANT
DRILL 2.5 (BIT) ×4
DRSG ADAPTIC 3X8 NADH LF (GAUZE/BANDAGES/DRESSINGS) ×2 IMPLANT
DRSG EMULSION OIL 3X3 NADH (GAUZE/BANDAGES/DRESSINGS) ×4 IMPLANT
DRSG PAD ABDOMINAL 8X10 ST (GAUZE/BANDAGES/DRESSINGS) ×4 IMPLANT
DURAPREP 26ML APPLICATOR (WOUND CARE) ×6 IMPLANT
ELECT BLADE TIP CTD 4 INCH (ELECTRODE) ×2 IMPLANT
ELECT REM PT RETURN 15FT ADLT (MISCELLANEOUS) ×4 IMPLANT
FIBER LASER TRAC TIP (UROLOGICAL SUPPLIES) ×2 IMPLANT
GAUZE SPONGE 4X4 12PLY STRL (GAUZE/BANDAGES/DRESSINGS) ×4 IMPLANT
GLOVE BIOGEL M STRL SZ7.5 (GLOVE) ×4 IMPLANT
GLOVE ORTHO TXT STRL SZ7.5 (GLOVE) ×4 IMPLANT
GLOVE SURG ORTHO 8.5 STRL (GLOVE) ×4 IMPLANT
GOWN STRL REUS W/TWL LRG LVL3 (GOWN DISPOSABLE) ×8 IMPLANT
GOWN STRL REUS W/TWL XL LVL3 (GOWN DISPOSABLE) ×4 IMPLANT
GUIDEWIRE ANG ZIPWIRE 038X150 (WIRE) IMPLANT
GUIDEWIRE STR DUAL SENSOR (WIRE) IMPLANT
KIT BASIN OR (CUSTOM PROCEDURE TRAY) ×4 IMPLANT
MANIFOLD NEPTUNE II (INSTRUMENTS) ×4 IMPLANT
NDL MA TROC 1/2 CIR (NEEDLE) ×2 IMPLANT
NDL MAYO CATGUT SZ4 TPR NDL (NEEDLE) ×2 IMPLANT
NDL SAFETY ECLIPSE 18X1.5 (NEEDLE) ×2 IMPLANT
NEEDLE HYPO 18GX1.5 SHARP (NEEDLE) ×4
NEEDLE MA TROC 1/2 CIR (NEEDLE) IMPLANT
NEEDLE MAYO CATGUT SZ4 (NEEDLE) IMPLANT
NS IRRIG 1000ML POUR BTL (IV SOLUTION) ×4 IMPLANT
PACK CYSTO (CUSTOM PROCEDURE TRAY) ×4 IMPLANT
PACK SHOULDER (CUSTOM PROCEDURE TRAY) ×4 IMPLANT
PAD ABD 8X10 STRL (GAUZE/BANDAGES/DRESSINGS) ×2 IMPLANT
PASSER SUT SWANSON 36MM LOOP (INSTRUMENTS) ×2 IMPLANT
PIN GUIDE 1.2 (PIN) ×2 IMPLANT
PIN METAGLENE 2.5 (PIN) ×2 IMPLANT
POSITIONER SURGICAL ARM (MISCELLANEOUS) ×4 IMPLANT
SHEATH ACCESS URETERAL 38CM (SHEATH) ×2 IMPLANT
SLING ARM IMMOBILIZER LRG (SOFTGOODS) ×4 IMPLANT
SPONGE LAP 18X18 X RAY DECT (DISPOSABLE) ×2 IMPLANT
SPONGE LAP 4X18 X RAY DECT (DISPOSABLE) ×2 IMPLANT
SPONGE SURGIFOAM ABS GEL 100 (HEMOSTASIS) ×4 IMPLANT
STAPLER VISISTAT 35W (STAPLE) ×2 IMPLANT
STENT POLARIS 5FRX24 (STENTS) ×2 IMPLANT
STRIP CLOSURE SKIN 1/2X4 (GAUZE/BANDAGES/DRESSINGS) ×3 IMPLANT
SUT BONE WAX W31G (SUTURE) ×2 IMPLANT
SUT FIBERWIRE #2 38 T-5 BLUE (SUTURE) ×4
SUT VIC AB 0 CT1 27 (SUTURE) ×8
SUT VIC AB 0 CT1 27XBRD ANTBC (SUTURE) ×4 IMPLANT
SUT VIC AB 2-0 CT1 27 (SUTURE) ×4
SUT VIC AB 2-0 CT1 27XBRD (SUTURE) ×2 IMPLANT
SUT VIC AB 3-0 SH 27 (SUTURE) ×4
SUT VIC AB 3-0 SH 27X BRD (SUTURE) IMPLANT
SUTURE FIBERWR #2 38 T-5 BLUE (SUTURE) IMPLANT
SYR 20CC LL (SYRINGE) ×4 IMPLANT
SYR 50ML LL SCALE MARK (SYRINGE) ×4 IMPLANT
TAPE CLOTH SURG 6X10 WHT LF (GAUZE/BANDAGES/DRESSINGS) ×2 IMPLANT
TOWEL OR 17X26 10 PK STRL BLUE (TOWEL DISPOSABLE) ×8 IMPLANT
WATER STERILE IRR 1500ML POUR (IV SOLUTION) ×4 IMPLANT
WIRE COONS/BENSON .038X145CM (WIRE) ×4 IMPLANT

## 2017-06-25 NOTE — Progress Notes (Signed)
RN attempted to call patients Father Dayton Kenley) listed in chart. No answer on phone number provided. Will try again in 20-30 minutes

## 2017-06-25 NOTE — Progress Notes (Addendum)
Orthopedics Progress Note  Subjective: Patient reports shoulder hurts if he moves it  Objective:  Vitals:   06/18/2017 1835 06/19/2017 2243  BP: (!) 153/66 (!) 141/83  Pulse: 65 61  Resp: 20 20  Temp: 97.8 F (36.6 C) 97.8 F (36.6 C)  SpO2: 98% 95%    General: Awake and alert  Musculoskeletal: right shoulder swollen and tender, no ROM due to pain, distally able to wiggle the fingers , normal sensation Neurovascularly intact  Lab Results  Component Value Date   WBC 12.9 (H) 06/23/2017   HGB 14.2 06/14/2017   HCT 42.3 06/29/2017   MCV 87.9 06/26/2017   PLT 162 06/19/2017       Component Value Date/Time   NA 140 06/14/2017 1508   K 3.2 (L) 06/02/2017 1508   CL 109 07/01/2017 1508   CO2 23 06/14/2017 1508   GLUCOSE 157 (H) 06/11/2017 1508   BUN 23 (H) 06/03/2017 1508   CREATININE 1.96 (H) 06/10/2017 1508   CREATININE 0.99 11/28/2013 1536   CALCIUM 8.0 (L) 06/04/2017 1508   GFRNONAA 34 (L) 06/28/2017 1508   GFRNONAA 80 11/28/2013 1536   GFRAA 39 (L) 06/05/2017 1508   GFRAA >89 11/28/2013 1536    Lab Results  Component Value Date   INR 1.36 06/27/2017   INR 1.06 11/04/2012   INR 1.04 08/07/2009    Assessment/Plan: Right shoulder displaced and comminuted proximal humerus fracture.  I discussed with the patient the need for surgical management for this unstable and poorly aligned fracture.  I recommended ORIF versus arthroplasty (hemi versus reverse TSA).  Patient's father is the POA for Mr Dave due to MR related to Down's Syndrome.  I will discuss this in detail with the patient tomorrow morning prior to surgery for the shoulder and will obtain informed consent. Urine reported as cloudy by nursing.  NPO  Doran Heater. Veverly Fells, MD 06/25/2017 12:41 AM

## 2017-06-25 NOTE — Progress Notes (Signed)
PROGRESS NOTE [facility]   Eduardo Herman   QJJ:[id number] DOB: 1951/03/28  DOA: 06/20/2017 PCP: Leonard Downing, MD   Brief Narrative:  Eduardo Herman  is a 66 year old male with medical history significant of Down syndrome, MR, hypothyroidism, chronic kidney disease stage III chronic venous insufficiency who presented to the emergency department after mechanical fall and right shoulder pain. Patient was found to have R shoulder fracture orthopedic surgery was consulted and is planning to perform surgery  CT of the abdomen was done due to abdominal pain she revealed a 5 mm kidney stone with possible pyelonephritis. Patient was started on IV abx, flomax and urology was consulted   Subjective: Patient seen and examined, have no complaints other than lower abdominal pain, but not specific.   Assessment & Plan: Fracture of right humerus - status post mechanical fall Orthopedic surgery consulted Pain medication as needed For surgical procedure today  Right ureteral stone with possible pyelonephritis Urology was consulted planning for stone retrieval during orthopedic surgery Started on IV antibiotics prophylactically IV fluids and Flomax  Acute kidney injury on Chronic kidney disease stage III Baseline creatinine on 2016 was 1.79, although patient on admission was found to have creatinine of 2.13 creatinine has responded to IV fluids which meets criteria for acute renal failure. Continue IV fluids for now Avoid nephrotoxic agent Check creatinine in the morning  Hypokalemia Repeat Check magnesium and potassium in the morning  Hypertension Blood pressure remained stable Continue atenolol and Cardizem Holding Lasix for now.  Down syndrome with MR Stable, father legal guardian  DVT prophylaxis: SCDs Code Status: Full code Family Communication: None at bedside Disposition Plan: To be determined  Consultants:   Orthopedic surgery  Urology  Procedures:      Antimicrobials: Anti-infectives    Start     Dose/Rate Route Frequency Ordered Stop   06/27/2017 2000  ceFAZolin (ANCEF) IVPB 2g/100 mL premix  Status:  Discontinued     2 g 200 mL/hr over 30 Minutes Intravenous Every 6 hours 06/29/2017 1824 06/17/2017 1835   06/16/2017 1316  ceFAZolin (ANCEF) 3 g in dextrose 5 % 50 mL IVPB     3 g 130 mL/hr over 30 Minutes Intravenous 30 min pre-op 06/28/2017 1316 06/21/2017 1359   06/23/2017 1400  cefTRIAXone (ROCEPHIN) 1 g in dextrose 5 % 50 mL IVPB     1 g 100 mL/hr over 30 Minutes Intravenous Every 24 hours 06/28/2017 1334          Objective: Vitals:   06/06/2017 0110 06/10/2017 0456 06/16/2017 0805 06/22/2017 0908  BP:  (!) 149/75  138/80  Pulse:  60 62 62  Resp:  16  14  Temp:  98.1 F (36.7 C)  97.6 F (36.4 C)  TempSrc:  Oral  Oral  SpO2:  96%  96%  Weight: 134.9 kg (297 lb 6.4 oz)     Height: 5\' 8"  (1.727 m)       Intake/Output Summary (Last 24 hours) at 06/29/2017 1406 Last data filed at 06/11/2017 0900  Gross per 24 hour  Intake           818.75 ml  Output              925 ml  Net          -106.25 ml   Filed Weights   06/18/2017 0110  Weight: 134.9 kg (297 lb 6.4 oz)    Examination:  General exam: NAD HEENT: OP moist and clear Respiratory  system: Clear to auscultation. No wheezes,crackle or rhonchi Cardiovascular system: S1 & S2 heard, RRR. No JVD, murmurs, rubs or gallops Gastrointestinal system: Abdomen soft nondistended, mild tenderness to palpation in lower abdomen Central nervous system: Unable to ablate patient had MR Extremities: Chronic venous stasis, brown edema bilaterally Skin: Chronic stable changes from venous stasis  Data Reviewed: I have personally reviewed following labs and imaging studies  CBC:  Recent Labs Lab 06/14/2017 0352 06/11/2017 1508 06/28/2017 0505  WBC 17.2* 12.9* 10.5  NEUTROABS 13.9*  --  7.9*  HGB 15.1 14.2 13.2  HCT 44.8 42.3 39.9  MCV 88.0 87.9 88.1  PLT 192 162 161   Basic Metabolic  Panel:  Recent Labs Lab 06/12/2017 0352 06/23/2017 1508 06/21/2017 0505  NA 140 140 140  K 3.3* 3.2* 3.3*  CL 108 109 109  CO2 24 23 25   GLUCOSE 159* 157* 131*  BUN 23* 23* 26*  CREATININE 2.13* 1.96* 1.69*  CALCIUM 8.3* 8.0* 7.9*   GFR: Estimated Creatinine Clearance: 57 mL/min (A) (by C-G formula based on SCr of 1.69 mg/dL (H)). Liver Function Tests: No results for input(s): AST, ALT, ALKPHOS, BILITOT, PROT, ALBUMIN in the last 168 hours.  Recent Labs Lab 06/25/2017 0945  LIPASE 18   No results for input(s): AMMONIA in the last 168 hours. Coagulation Profile:  Recent Labs Lab 06/16/2017 0945  INR 1.36   Cardiac Enzymes:  Recent Labs Lab 06/11/2017 0353 06/02/2017 0945  CKTOTAL  --  85  TROPONINI <0.03  --    BNP (last 3 results) No results for input(s): PROBNP in the last 8760 hours. HbA1C: No results for input(s): HGBA1C in the last 72 hours. CBG: No results for input(s): GLUCAP in the last 168 hours. Lipid Profile: No results for input(s): CHOL, HDL, LDLCALC, TRIG, CHOLHDL, LDLDIRECT in the last 72 hours. Thyroid Function Tests: No results for input(s): TSH, T4TOTAL, FREET4, T3FREE, THYROIDAB in the last 72 hours. Anemia Panel: No results for input(s): VITAMINB12, FOLATE, FERRITIN, TIBC, IRON, RETICCTPCT in the last 72 hours. Sepsis Labs: No results for input(s): PROCALCITON, LATICACIDVEN in the last 168 hours.  Recent Results (from the past 240 hour(s))  Surgical pcr screen     Status: None   Collection Time: 06/25/2017  8:44 PM  Result Value Ref Range Status   MRSA, PCR NEGATIVE NEGATIVE Final   Staphylococcus aureus NEGATIVE NEGATIVE Final    Comment:        The Xpert SA Assay (FDA approved for NASAL specimens in patients over 20 years of age), is one component of a comprehensive surveillance program.  Test performance has been validated by [facility] for patients greater than or equal to 28 year old. It is not intended to diagnose infection nor  to guide or monitor treatment.       Radiology Studies: Ct Abdomen Pelvis Wo Contrast  Result Date: 06/23/2017 CLINICAL DATA:  A 66 year old male with right lower quadrant pain, abdominal distension and constipation for 3 days. Status post fall. EXAM: CT ABDOMEN AND PELVIS WITHOUT CONTRAST TECHNIQUE: Multidetector CT imaging of the abdomen and pelvis was performed following the standard protocol without IV contrast. COMPARISON:  02/28/2012 FINDINGS: Please note image evaluation is slightly limited due to motion artifact. Lower chest: There is dependent bibasilar atelectasis/scarring. There is eventration of the left hemidiaphragm. Otherwise no acute abnormalities in the lower chest. Hepatobiliary: No focal liver abnormality is seen. Status post cholecystectomy. No biliary dilatation. Pancreas: Unremarkable. No pancreatic ductal dilatation or surrounding inflammatory changes.  Spleen: Normal in size without focal abnormality. Adrenals/Urinary Tract: Adrenal glands are unchanged in appearance. The right kidney is enlarged with marked surrounding retroperitoneal fat stranding suggestive of an inflammatory process. A 5 mm renal calculus is identified at the right mid ureter with mild proximal dilatation and periureteral fat stranding. The left kidney is normal, without renal calculi, focal lesion, or hydronephrosis. Bladder is unremarkable. Stomach/Bowel: Stomach is within normal limits. Appendix appears normal. No evidence of bowel wall thickening, distention, or inflammatory changes. Vascular/Lymphatic: No significant vascular findings are present. Prominent mesenteric and retroperitoneal lymph nodes appear similar to study dated 2013. Reproductive: Prostate is unremarkable. Other: No abdominal wall hernia or abnormality. No abdominopelvic ascites. Musculoskeletal: No acute or significant osseous findings. Mild to moderate discogenic degenerative changes. IMPRESSION: 1. Enlargement of the right kidney with  marked surrounding retroperitoneal fat stranding consistent with an acute inflammatory/infectious process such as pyelonephritis. Correlate with urinalysis. 2. 5 mm renal calculus at the right mid ureter with proximal ureteral dilatation and fat stranding suggesting a component of obstruction. 3. Borderline enlarged retroperitoneal lymph nodes stable since 2013. 4. Other stable and chronic finding as detailed. Electronically Signed   By: Kristopher Oppenheim M.D.   On: 06/19/2017 07:53   Dg Shoulder Right  Result Date: 06/20/2017 CLINICAL DATA:  Right arm pain. EXAM: RIGHT SHOULDER - 2+ VIEW COMPARISON:  None. FINDINGS: Comminuted displaced fracture of the humeral neck. Degree of displacement is not well assessed given positioning. Glenohumeral alignment is suboptimally assessed but grossly normal. The acromioclavicular joint is congruent. IMPRESSION: Comminuted displaced humeral neck fracture. Electronically Signed   By: Jeb Levering M.D.   On: 06/30/2017 03:11   Dg Forearm Right  Result Date: 06/11/2017 CLINICAL DATA:  Right arm pain. EXAM: RIGHT FOREARM - 2 VIEW COMPARISON:  None. FINDINGS: There is no evidence of fracture or other focal bone lesions. Elbow and wrist alignment is maintained. Soft tissue edema about the dorsum of the proximal forearm. IMPRESSION: Soft tissue edema of the proximal forearm, no acute osseous abnormality. Electronically Signed   By: Jeb Levering M.D.   On: 06/06/2017 03:11   Ct Head Wo Contrast  Result Date: 07/01/2017 CLINICAL DATA:  Unwitnessed fall EXAM: CT HEAD WITHOUT CONTRAST TECHNIQUE: Contiguous axial images were obtained from the base of the skull through the vertex without intravenous contrast. COMPARISON:  05/27/2015 FINDINGS: Motion degraded images. Brain: No evidence of acute infarction, hemorrhage, hydrocephalus, extra-axial collection or mass lesion/mass effect. Mild cortical atrophy. Subcortical white matter and periventricular small vessel ischemic  changes. Vascular: No hyperdense vessel or unexpected calcification. Skull: Normal. Negative for fracture or focal lesion. Sinuses/Orbits: The visualized paranasal sinuses are essentially clear. The mastoid air cells are unopacified. Other: None. IMPRESSION: Motion degraded images. No evidence of acute intracranial abnormality. Atrophy with small vessel ischemic changes. Electronically Signed   By: Julian Hy M.D.   On: 06/27/2017 03:13   Ct Shoulder Right Wo Contrast  Result Date: 06/02/2017 CLINICAL DATA:  Proximal right humerus fracture. EXAM: CT OF THE UPPER RIGHT EXTREMITY WITHOUT CONTRAST TECHNIQUE: Multidetector CT imaging of the upper right extremity was performed according to the standard protocol. COMPARISON:  Radiographs dated 06/16/2017 FINDINGS: Bones/Joint/Cartilage There is a comminuted angulated and displaced fracture of right humeral head and proximal neck. There is no dislocation of the humeral head. The majority of the articular surface of the humeral head is intact. The scapula is intact.  AC joint appears normal. IMPRESSION: Comminuted angulated, impacted and displaced fracture of the right humeral  head and neck. Electronically Signed   By: Lorriane Shire M.D.   On: 07/01/2017 09:28   Dg Abd Acute W/chest  Result Date: 06/30/2017 CLINICAL DATA:  Abdominal pain, constipation EXAM: DG ABDOMEN ACUTE W/ 1V CHEST COMPARISON:  Chest radiographs dated 06/06/2013. CT abdomen/pelvis dated 4 II 1,013. FINDINGS: Lungs are clear. No frank interstitial edema. No pleural effusion or pneumothorax. Cardiomegaly. Nonobstructive bowel gas pattern. Mild gaseous distention of bowel in the left mid abdomen raises the possibility of adynamic ileus. Normal colonic stool burden. No evidence of free air on the lateral decubitus view. Cholecystectomy clips. IMPRESSION: No evidence of acute cardiopulmonary disease. No evidence of small bowel obstruction or free air. Possible mild adynamic ileus. Normal  colonic stool burden. Electronically Signed   By: Julian Hy M.D.   On: 06/10/2017 03:11      Scheduled Meds: . [MAR Hold] atenolol  100 mg Oral Daily  . [MAR Hold] diltiazem  360 mg Oral Daily  . [MAR Hold] levothyroxine  225 mcg Oral QAC breakfast  . [MAR Hold] nicotine  21 mg Transdermal Daily  . [MAR Hold] polyethylene glycol  17 g Oral Daily  . [MAR Hold] potassium chloride  20 mEq Oral Once  . [MAR Hold] tamsulosin  0.4 mg Oral Daily   Continuous Infusions: . [MAR Hold] cefTRIAXone (ROCEPHIN)  IV Stopped (06/15/2017 1504)  . dextrose 5 % and 0.45% NaCl 75 mL/hr at 06/04/2017 0808     LOS: 1 day    Time spent: Total of 25 minutes spent with pt, greater than 50% of which was spent in discussion of  treatment, counseling and coordination of care    Chipper Oman, MD Pager: Text Page via [email or web address]   If 7PM-7AM, please contact night-coverage [email or web address] 06/14/2017, 2:06 PM

## 2017-06-25 NOTE — Progress Notes (Signed)
RN attempted to call for consent from Father again . This is the only family contact number listed on patient's chart. When voicemail picks up, the family's name is stated and it is the correct name. This is the second time RN has attempted to call for consent. No family present at bedside. RN will call OR charge nurse to alert of delay.

## 2017-06-25 NOTE — Brief Op Note (Signed)
06/02/2017 - 06/30/2017  2:17 PM  PATIENT:  Eduardo Herman  66 y.o. male  PRE-OPERATIVE DIAGNOSIS:  Right proximal humerous fracture / right ureteral stone  POST-OPERATIVE DIAGNOSIS:  right proximal humerous fracture/right ureteral stone  PROCEDURE:  Cysto, RIGHT retrograde / ureteroscopy / basket stone / ureteral stent placement  SURGEON:      Alexis Frock, MD - Primary  PHYSICIAN ASSISTANT:   ASSISTANTS: none   ANESTHESIA:   general  EBL:  Total I/O In: 0  Out: 400 [Urine:400]  BLOOD ADMINISTERED:none  DRAINS: 11F foley to gravity   LOCAL MEDICATIONS USED:  NONE  SPECIMEN:  Source of Specimen:  Rt ureteral stone fragments  DISPOSITION OF SPECIMEN:  [facility] for compositional analysis  COUNTS:  YES  TOURNIQUET:  * No tourniquets in log *  DICTATION: .Other Dictation: Dictation Number [id number]  PLAN OF CARE: Admit to inpatient   PATIENT DISPOSITION:  PACU - hemodynamically stable.   Delay start of Pharmacological VTE agent (>24hrs) due to surgical blood loss or risk of bleeding: not applicable

## 2017-06-25 NOTE — Anesthesia Preprocedure Evaluation (Addendum)
Anesthesia Evaluation  Patient identified by MRN, date of birth, ID band Patient confused    Reviewed: Allergy & Precautions, NPO status , Patient's Chart, lab work & pertinent test results  History of Anesthesia Complications Negative for: history of anesthetic complications  Airway Mallampati: II  TM Distance: >3 FB Neck ROM: Full    Dental  (+) Edentulous Upper, Edentulous Lower, Dental Advisory Given   Pulmonary Current Smoker,    Pulmonary exam normal        Cardiovascular hypertension, Normal cardiovascular exam     Neuro/Psych Down's Syndrome, MR negative psych ROS   GI/Hepatic negative GI ROS,   Endo/Other  Hypothyroidism Morbid obesity  Renal/GU Renal disease     Musculoskeletal   Abdominal   Peds  Hematology   Anesthesia Other Findings   Reproductive/Obstetrics                            Anesthesia Physical Anesthesia Plan  ASA: III  Anesthesia Plan: General   Post-op Pain Management:    Induction: Intravenous  PONV Risk Score and Plan: 3 and Ondansetron, Dexamethasone, Diphenhydramine and Treatment may vary due to age or medical condition  Airway Management Planned: Oral ETT  Additional Equipment:   Intra-op Plan:   Post-operative Plan: Possible Post-op intubation/ventilation and Extubation in OR  Informed Consent: I have reviewed the patients History and Physical, chart, labs and discussed the procedure including the risks, benefits and alternatives for the proposed anesthesia with the patient or authorized representative who has indicated his/her understanding and acceptance.   Dental advisory given and Consent reviewed with POA  Plan Discussed with: Anesthesiologist, CRNA and Surgeon  Anesthesia Plan Comments:        Anesthesia Quick Evaluation

## 2017-06-25 NOTE — Anesthesia Postprocedure Evaluation (Signed)
Anesthesia Post Note  Patient: Eduardo Herman  Procedure(s) Performed: Procedure(s) (LRB): reverse right SHOULDER ARTHROPLASTY (Right) CYSTOSCOPY WITH RETROGRADE PYELOGRAM, URETEROSCOPY AND STENT PLACEMENT basket extraction stone (Right)     Patient location during evaluation: PACU Anesthesia Type: General Level of consciousness: sedated Pain management: pain level controlled Vital Signs Assessment: post-procedure vital signs reviewed and stable Respiratory status: spontaneous breathing and respiratory function stable Cardiovascular status: stable Anesthetic complications: no    Last Vitals:  Vitals:   06/22/2017 1800 06/24/2017 1815  BP: 131/70 131/71  Pulse: 83 81  Resp: 16 16  Temp: 37.1 C 37.4 C  SpO2: 95% 95%    Last Pain:  Vitals:   06/10/2017 1730  TempSrc:   PainSc: Asleep                 Hollis Tuller,Deaundre DANIEL

## 2017-06-25 NOTE — Care Management Note (Signed)
Case Management Note  Patient Details  Name: WOODROW DRAB MRN: [id number] Date of Birth: 22-Sep-1951  Subjective/Objective:   66 y.o. Awaiting surgery. CM will follow for [facility] needs after PT eval.   Action/Plan:CM will follow closely for disposition/discharge needs.    Expected Discharge Date:   (unknown)               Expected Discharge Plan:     In-House Referral:     Discharge planning Services  CM Consult  Post Acute Care Choice:    Choice offered to:     DME Arranged:    DME Agency:     HH Arranged:    HH Agency:     Status of Service:  In process, will continue to follow  If discussed at Long Length of Stay Meetings, dates discussed:    Additional Comments:  Delrae Sawyers, RN 06/26/2017, 12:27 PM

## 2017-06-25 NOTE — Discharge Instructions (Signed)
Please keep a pillow or blanket propped behind the right elbow to keep the patient's right arm across his body.    Partial weight bearing with the right UE, only for balance.  No pushing up with the arm to help standing please.  No reaching behind the back.  Ok to use right am for light ADLs  Call for follow up appointment with Dr Veverly Fells in two weeks 21 [phone]

## 2017-06-25 NOTE — Brief Op Note (Signed)
06/21/2017 - 06/05/2017  5:12 PM  PATIENT:  Eduardo Herman  66 y.o. male  PRE-OPERATIVE DIAGNOSIS:(1)  Right proximal humerus fracture / (2) right ureteral stone  POST-OPERATIVE DIAGNOSIS: (1)  Right proximal humerus fracture/ (2) right ureteral stone  PROCEDURE:  Procedure(s): reverse right SHOULDER ARTHROPLASTY (Right) CYSTOSCOPY WITH RETROGRADE PYELOGRAM, URETEROSCOPY AND STENT PLACEMENT basket extraction stone (Right)  SURGEON:  Surgeon(s) and Role: Panel 1:    Netta Cedars, MD - Primary  Panel 2:    * Alexis Frock, MD - Primary  PHYSICIAN ASSISTANT:   ASSISTANTS: Darol Destine, PA-C (ortho)   ANESTHESIA:   general  EBL:  Total I/O In: 1931.3 [I.V.:1931.3] Out: 650 [Urine:500; Blood:150]  BLOOD ADMINISTERED:none  DRAINS: none   LOCAL MEDICATIONS USED:  MARCAINE     SPECIMEN:  No Specimen for ortho  DISPOSITION OF SPECIMEN:  N/A  COUNTS:  YES  TOURNIQUET:  * No tourniquets in log *  DICTATION: .Other Dictation: Dictation Number [id number]  PLAN OF CARE: Admit to inpatient   PATIENT DISPOSITION:  PACU - hemodynamically stable.   Delay start of Pharmacological VTE agent (>24hrs) due to surgical blood loss or risk of bleeding: not applicable

## 2017-06-25 NOTE — Op Note (Signed)
NAME:  HELAMAN, Eduardo Herman NO.:  [id number]  MEDICAL RECORD NO.:  [id number]  LOCATION:  RESA                         FACILITY:  [facility]  PHYSICIAN:  Doran Heater. Veverly Fells, M.D. DATE OF BIRTH:  1951/06/13  DATE OF PROCEDURE:  06/12/2017 DATE OF DISCHARGE:                              OPERATIVE REPORT   PREOPERATIVE DIAGNOSIS:  Right comminuted and displaced proximal humerus fracture.  POSTOPERATIVE DIAGNOSIS:  Right comminuted and displaced proximal humerus fracture.  PROCEDURE PERFORMED:  Right shoulder reverse total shoulder arthroplasty using DePuy Delta Xtend prosthesis.  ATTENDING SURGEON:  Doran Heater. Veverly Fells, M.D.  ASSISTANT:  Charletta Cousin Dixon, [place], who scrubbed the entire procedure and necessary for satisfactory completion of surgery.  ANESTHESIA:  General anesthesia was used.  ESTIMATED BLOOD LOSS:  300 mL.  FLUID REPLACEMENT:  1500 mL of crystalloid.  INSTRUMENT COUNTS:  Correct.  COMPLICATIONS:  There were no complications.  ANTIBIOTICS:  Perioperative antibiotics were given.  INDICATIONS:  The patient is a 66 year old male with a history of Down syndrome, who presents with a fall and injury to his right shoulder. The patient also has a right ureteral stone.  The urologist elected to perform his procedure, which was coincident as far as the anesthesia goes with our surgery.  His procedure was performed first.  Then, we moved the patient from that table to a separate table for our portion of the procedure.  Risks and benefits of the orthopedic management of his shoulder were discussed in detail with the patient.  We discussed potential ORIF versus shoulder arthroplasty.  The patient understood that we would try to do the ORIF if possible, but if the shoulder was too comminuted or we could not obtain proper fixation that we would be moving towards a reverse shoulder arthroplasty.  Informed consent was obtained in discussing with the patient's power of  attorney.  DESCRIPTION OF PROCEDURE:  After an adequate level of anesthesia was achieved, the patient was positioned in the modified beach-chair position.  The right shoulder correctly identified and sterilely prepped and draped in the usual manner.  Time-out called.  We entered the shoulder using standard deltopectoral incision, starting at the coracoid process extending down to the anterior humerus with a #10 blade scalpel. Dissection down through subcutaneous tissues using Bovie electrocautery which we used to control hemostasis.  Cephalic vein identified and taken laterally with the deltoid, pectoralis taken medially.  Upon going down through the deltopectoral interval, we immediately encountered fresh fracture hematoma.  Two large bone fragments were expressed as we began to evacuate the fracture hematoma.  I was able to palpate the anterior humeral shaft which was far forward to where it should be, and then began to try to manipulate the fracture to get into a good position. Unfortunately, more large pieces of bone were free-floating and came out including 1 very large cancellous piece.  It appeared that a good portion of the humeral head cancellous bone had become loose.  There was just more of a shell with perhaps 1 cm of bone under the subchondral plate around until we got to the back area where the greater tuberosity was.  Because of loss  of significant medial cortical bone and the medial calcar, the scalloping out or cavitation deformity there in the humeral head, it was decided that the most dependable surgery that we would be able to perform for him to deal with his issue of this severely comminuted broken shoulder would be a reverse shoulder replacement.  We felt that it would be extremely difficult to get secure fixation with a limited amount of bone available in the humeral head.  We also had essentially no soft tissue stability medially and also lost a lot of bony  support medially.  It was our feeling with the patient having Down syndrome and some mental retardation that the chances of him being able to maintain a nonweightbearing status for a 10-12 week period was fairly slim and that he would be best served with a reverse shoulder arthroplasty that the patient could immediately use for ADLs.  We proceeded along that line.  We irrigated thoroughly, removed the humeral head, and also removed the tuberosity fragments.  I did tag the subscap for retraction and protection of the axillary nerve which was palpated and in continuity.  We then went ahead and identified the glenoid, removed the glenoid labrum, removed the cartilage on the surface of the glenoid, found the center point for our guide pin for the base plate preparation.  We then reamed for the base plate metaglene and then drilled out the central peg hole for that and impacted the base plate into position.  We then placed a 48 screw inferiorly, a 42 screw superiorly, and then an 18 screw nonlocked anteriorly.  We had excellent screw fixation.  We went ahead and locked the locking mechanisms and those locking screws superiorly and inferiorly.  With the base plate firmly secured, we irrigated thoroughly and then placed a 42 standard glenosphere into position and screwed that tight.  Once that was secured, I double checked to make sure there was no soft tissue caught up into the glenosphere and base plate construct.  Next, we directed our attention towards the humeral shaft.  There was no proximal bone remaining, so we were just dealing with purely the shaft.  So we reamed by hand and just did 6, 9, 10, 12 and once we were to the 12 diameter, we then placed a 12 monoblock stem which was a size 2 proximally.  We impacted that in about 10 degrees of retroversion.  We then reduced the shoulder with the 42 +3 poly trial and noted we could definitely get more length on the lateral side.  We at this  point irrigated thoroughly, dried the canal, and then cemented the real cemented stem.  Again, it is a size 12 diameter stem with a 2 proximal size.  Once that was in proper position in 10 degrees of retroversion, we held that while the cement hardened.  The medial portion of the implant was resting on the medial shaft bone, so we did have our length locked in.  At this point, once the cement hardened, we then trialed with a 42 +6 and then +9.  We felt like we could get just a little tighter.  There was still some slight gapping in extension with the extension of the shoulder and also with a little bit of an inferior pole.  So we elected to proceed with the 9 mm Shim, which was a metal augment, which was impacted first and then a 42 +3 polyethylene insert which was inserted and impacted in place.  We  reduced the shoulder, had a nice little pop there, had excellent stability throughout a full arc of motion.  We irrigated thoroughly, checked the nerve to make sure it was not under too much tension which it was not.  We then repaired the deltopectoral interval with 0 Vicryl suture followed by 2-0 Vicryl for subcutaneous closure and then staples for skin.  The patient had a sterile bandage placed, shoulder sling, immobilizer and taken to the recovery room in stable condition.     Doran Heater. Veverly Fells, M.D.    SRN/MEDQ  D:  06/03/2017  T:  06/30/2017  Job:  [id number]

## 2017-06-25 NOTE — Transfer of Care (Signed)
Immediate Anesthesia Transfer of Care Note  Patient: Yeudiel Mateo Mcleary  Procedure(s) Performed: Procedure(s): reverse right SHOULDER ARTHROPLASTY (Right) CYSTOSCOPY WITH RETROGRADE PYELOGRAM, URETEROSCOPY AND STENT PLACEMENT basket extraction stone (Right)  Patient Location: PACU  Anesthesia Type:General  Level of Consciousness:  sedated, patient cooperative and responds to stimulation  Airway & Oxygen Therapy:Patient Spontanous Breathing and Patient connected to face mask oxgen  Post-op Assessment:  Report given to PACU RN and Post -op Vital signs reviewed and stable  Post vital signs:  Reviewed and stable  Last Vitals:  Vitals:   06/29/2017 0908 06/11/2017 1700  BP: 138/80 (!) (P) 160/66  Pulse: 62 (P) 81  Resp: 14 (!) (P) 23  Temp: 36.4 C (P) 37.3 C  SpO2: 96% (P) 754%    Complications: No apparent anesthesia complications

## 2017-06-25 NOTE — Op Note (Signed)
NAME:  Eduardo, Herman NO.:  [id number]  MEDICAL RECORD NO.:  [id number]  LOCATION:  RESA                         FACILITY:  [facility]  PHYSICIAN:  Alexis Frock, MD     DATE OF BIRTH:  Aug 22, 1951  DATE OF PROCEDURE: 06/24/2017                               OPERATIVE REPORT   PREOPERATIVE DIAGNOSES:  Right ureteral stone with refractory colic. Also right shoulder fracture.  PROCEDURE: 1. Cystoscopy with right retrograde pyelogram interpretation. 2. Right ureteroscopy with basketing of stone. 3. Insertion of right ureteral stent, 5 x 24 Polaris with tether.  ESTIMATED BLOOD LOSS:  Nil.  COMPLICATION:  None.  SPECIMENS:  Right ureteral stone fragments for compositional analysis.  FINDINGS: 1. Widely displaced ureters, otherwise unremarkable urinary bladder. 2. Mildly impacted right mid ureteral stone, this was quite soft. 3. Complete resolution of all stone fragments following basketing the     stone within the right kidney and ureter. 4. Successful placement of right ureteral stent, proximal in a renal     pelvis and distal urinary bladder with tether fashion to 16-French     Foley.  INDICATION:  Eduardo Herman is a 66 year old gentleman with history of Down syndrome.  He has had a several week prodrome of colicky right-sided flank and abdominal pain and also very recent fall with shoulder fracture.  He was found on workup of his shoulder fracture, abdominal pain, also had a right mid ureteral stone with significant hydronephrosis and stranding but no obvious infectious parameters. Options were discussed with the patient and his father, his healthcare power of attorney, management of the stone, and shoulder and they wished to proceed with a combined surgery with right ureteroscopy with goal of right side stone free at the same time as open reduction internal fixation of this shoulder.  Informed consent was obtained and placed in the medical record.  PROCEDURE  IN DETAIL:  The patient being, Eduardo Herman being verified procedure being right ureteroscopic stone manipulation was confirmed. Procedure was carried out.  Time-out was performed.  Intravenous antibiotics were administered.  General anesthesia was introduced.  The patient was placed into a low lithotomy position and sterile field was created by prepping the patient's penis, perineum, proximal thighs using iodine.  Cystourethroscopy was performed using a 22-French rigid cystoscope with offset lens.  Inspection of the anterior and posterior urethra were unremarkable.  Inspection of bladder revealed relatively wide spaced ureteral orifices.  The right ureteral orifice was cannulated with a 6-French end-hole catheter and right retrograde pyelogram was obtained.  Right retrograde pyelogram demonstrated a somewhat tortuous right ureter with single system but rather bifid kidney.  There was mild hydroureteronephrosis.  A 0.038 ZIPwire was carefully navigated to the level of the upper pole, set aside as a safety wire.  An 8-French feeding tube was placed in urinary bladder for pressure release.  Next, semi-rigid ureteroscopy was performed of the distal right ureter alongside a separate Sensor working wire.  The level of the mid to proximal ureter stone in question was encountered.  It appeared to be very very soft and likely proteinaceous versus infectious.  This was retrograde positioned at the level the proximal  ureter, and the semi- rigid scope was exchanged for a 12/14, 38-cm ureteral access sheath to the level of the mid ureter taking exquisite care not to pass the sheath proximal to the stone and flexible digital ureteroscopy was performed at the proximal ureter.  The stone in question was identified.  It was grasped with an Escape basket, which given the soft nature of the stone immediately crusted in several smooth smaller pieces.  These were then sequentially grasped, brought out in  their entirety, set aside for compositional analysis.  Inspection of the more proximal ureter and right kidney was then performed of all calices x3.  This revealed complete resolution of all stone fragments larger than 1/3rd mm.  No evidence of renal perforation.  There was some moderate edema at the site of prior stone.  Given this and access sheath usage, it was felt that brief interval stenting would be warranted with tethered stent. The access sheath was removed under continuous vision and a new 5 x 24 Polaris-type stent was placed using fluoroscopic guidance.  Good proximal distal deployment was noted.  As the patient was undergoing concomitant shoulder surgery, Foley catheter was placed per urethra to straight drain, 10 mL sterile water in the balloon and a tethered stent was fashioned to this.  The urologic portion of the procedure was then terminated, and the patient remains in the operative suite for repair of the shoulder by the orthopedic surgery team.          ______________________________ Alexis Frock, MD     TM/MEDQ  D:  06/12/2017  T:  06/28/2017  Job:  [id number]

## 2017-06-25 NOTE — Progress Notes (Signed)
Day of Surgery   Subjective/Chief Complaint:  1 - RIGHT Mid Ureteral Stone - 50mm Rt mid ureteral stone with mod hydro and some starnding by CT on eval Rt abd pain in setting of Rt shoulder injury. UA without infectious parameters, no fevers. Is having on / off colicky flank-abd pain.  Today "Eduardo Herman" is stable. Continues with colickly abdominal pain, no fevers. He is having ortho surgery today and pt and his father (POA) desire concomitant stone management with ureteroscopy.    Objective: Vital signs in last 24 hours: Temp:  [97.5 F (36.4 C)-98.1 F (36.7 C)] 97.6 F (36.4 C) (08/25 0908) Pulse Rate:  [60-65] 62 (08/25 0908) Resp:  [14-20] 14 (08/25 0908) BP: (138-153)/(66-83) 138/80 (08/25 0908) SpO2:  [95 %-98 %] 96 % (08/25 0908) Weight:  [134.9 kg (297 lb 6.4 oz)] 134.9 kg (297 lb 6.4 oz) (08/25 0110) Last BM Date:  (unknown)  Intake/Output from previous day: 08/24 0701 - 08/25 0700 In: 818.8 [I.V.:768.8; IV Piggyback:50] Out: 525 [Urine:525] Intake/Output this shift: No intake/output data recorded.  General appearance: alert, cooperative and stigmata of mental handicap but very pleasant and cooperative.  Head: Normocephalic, without obvious abnormality, atraumatic Eyes: negative Nose: Nares normal. Septum midline. Mucosa normal. No drainage or sinus tenderness. Throat: lips, mucosa, and tongue normal; teeth and gums normal Back: Mild Rt CVAT at present.  Resp: Non-labored on room air.  Cardio: Nl rate GI: soft, non-tender; bowel sounds normal; no masses,  no organomegaly and moderate truncal obesity.  Male genitalia: normal Extremities: extremities normal, atraumatic, no cyanosis or edema Pulses: 2+ and symmetric Lymph nodes: Cervical, supraclavicular, and axillary nodes normal. Neurologic: Mental status: AO x1.   Lab Results:   Recent Labs  07/01/2017 1508 06/27/2017 0505  WBC 12.9* 10.5  HGB 14.2 13.2  HCT 42.3 39.9  PLT 162 175   BMET  Recent Labs   06/07/2017 1508 06/21/2017 0505  NA 140 140  K 3.2* 3.3*  CL 109 109  CO2 23 25  GLUCOSE 157* 131*  BUN 23* 26*  CREATININE 1.96* 1.69*  CALCIUM 8.0* 7.9*   PT/INR  Recent Labs  06/13/2017 0945  LABPROT 16.9*  INR 1.36   ABG No results for input(s): PHART, HCO3 in the last 72 hours.  Invalid input(s): PCO2, PO2  Studies/Results: Ct Abdomen Pelvis Wo Contrast  Result Date: 06/02/2017 CLINICAL DATA:  A 66 year old male with right lower quadrant pain, abdominal distension and constipation for 3 days. Status post fall. EXAM: CT ABDOMEN AND PELVIS WITHOUT CONTRAST TECHNIQUE: Multidetector CT imaging of the abdomen and pelvis was performed following the standard protocol without IV contrast. COMPARISON:  02/28/2012 FINDINGS: Please note image evaluation is slightly limited due to motion artifact. Lower chest: There is dependent bibasilar atelectasis/scarring. There is eventration of the left hemidiaphragm. Otherwise no acute abnormalities in the lower chest. Hepatobiliary: No focal liver abnormality is seen. Status post cholecystectomy. No biliary dilatation. Pancreas: Unremarkable. No pancreatic ductal dilatation or surrounding inflammatory changes. Spleen: Normal in size without focal abnormality. Adrenals/Urinary Tract: Adrenal glands are unchanged in appearance. The right kidney is enlarged with marked surrounding retroperitoneal fat stranding suggestive of an inflammatory process. A 5 mm renal calculus is identified at the right mid ureter with mild proximal dilatation and periureteral fat stranding. The left kidney is normal, without renal calculi, focal lesion, or hydronephrosis. Bladder is unremarkable. Stomach/Bowel: Stomach is within normal limits. Appendix appears normal. No evidence of bowel wall thickening, distention, or inflammatory changes. Vascular/Lymphatic: No significant vascular  findings are present. Prominent mesenteric and retroperitoneal lymph nodes appear similar to study  dated 2013. Reproductive: Prostate is unremarkable. Other: No abdominal wall hernia or abnormality. No abdominopelvic ascites. Musculoskeletal: No acute or significant osseous findings. Mild to moderate discogenic degenerative changes. IMPRESSION: 1. Enlargement of the right kidney with marked surrounding retroperitoneal fat stranding consistent with an acute inflammatory/infectious process such as pyelonephritis. Correlate with urinalysis. 2. 5 mm renal calculus at the right mid ureter with proximal ureteral dilatation and fat stranding suggesting a component of obstruction. 3. Borderline enlarged retroperitoneal lymph nodes stable since 2013. 4. Other stable and chronic finding as detailed. Electronically Signed   By: Kristopher Oppenheim M.D.   On: 06/16/2017 07:53   Dg Shoulder Right  Result Date: 06/06/2017 CLINICAL DATA:  Right arm pain. EXAM: RIGHT SHOULDER - 2+ VIEW COMPARISON:  None. FINDINGS: Comminuted displaced fracture of the humeral neck. Degree of displacement is not well assessed given positioning. Glenohumeral alignment is suboptimally assessed but grossly normal. The acromioclavicular joint is congruent. IMPRESSION: Comminuted displaced humeral neck fracture. Electronically Signed   By: Jeb Levering M.D.   On: 06/09/2017 03:11   Dg Forearm Right  Result Date: 06/30/2017 CLINICAL DATA:  Right arm pain. EXAM: RIGHT FOREARM - 2 VIEW COMPARISON:  None. FINDINGS: There is no evidence of fracture or other focal bone lesions. Elbow and wrist alignment is maintained. Soft tissue edema about the dorsum of the proximal forearm. IMPRESSION: Soft tissue edema of the proximal forearm, no acute osseous abnormality. Electronically Signed   By: Jeb Levering M.D.   On: 06/30/2017 03:11   Ct Head Wo Contrast  Result Date: 06/04/2017 CLINICAL DATA:  Unwitnessed fall EXAM: CT HEAD WITHOUT CONTRAST TECHNIQUE: Contiguous axial images were obtained from the base of the skull through the vertex without  intravenous contrast. COMPARISON:  05/27/2015 FINDINGS: Motion degraded images. Brain: No evidence of acute infarction, hemorrhage, hydrocephalus, extra-axial collection or mass lesion/mass effect. Mild cortical atrophy. Subcortical white matter and periventricular small vessel ischemic changes. Vascular: No hyperdense vessel or unexpected calcification. Skull: Normal. Negative for fracture or focal lesion. Sinuses/Orbits: The visualized paranasal sinuses are essentially clear. The mastoid air cells are unopacified. Other: None. IMPRESSION: Motion degraded images. No evidence of acute intracranial abnormality. Atrophy with small vessel ischemic changes. Electronically Signed   By: Julian Hy M.D.   On: 06/23/2017 03:13   Ct Shoulder Right Wo Contrast  Result Date: 06/23/2017 CLINICAL DATA:  Proximal right humerus fracture. EXAM: CT OF THE UPPER RIGHT EXTREMITY WITHOUT CONTRAST TECHNIQUE: Multidetector CT imaging of the upper right extremity was performed according to the standard protocol. COMPARISON:  Radiographs dated 06/03/2017 FINDINGS: Bones/Joint/Cartilage There is a comminuted angulated and displaced fracture of right humeral head and proximal neck. There is no dislocation of the humeral head. The majority of the articular surface of the humeral head is intact. The scapula is intact.  AC joint appears normal. IMPRESSION: Comminuted angulated, impacted and displaced fracture of the right humeral head and neck. Electronically Signed   By: Lorriane Shire M.D.   On: 06/16/2017 09:28   Dg Abd Acute W/chest  Result Date: 06/14/2017 CLINICAL DATA:  Abdominal pain, constipation EXAM: DG ABDOMEN ACUTE W/ 1V CHEST COMPARISON:  Chest radiographs dated 06/06/2013. CT abdomen/pelvis dated 4 II 1,013. FINDINGS: Lungs are clear. No frank interstitial edema. No pleural effusion or pneumothorax. Cardiomegaly. Nonobstructive bowel gas pattern. Mild gaseous distention of bowel in the left mid abdomen raises the  possibility of adynamic ileus. Normal colonic  stool burden. No evidence of free air on the lateral decubitus view. Cholecystectomy clips. IMPRESSION: No evidence of acute cardiopulmonary disease. No evidence of small bowel obstruction or free air. Possible mild adynamic ileus. Normal colonic stool burden. Electronically Signed   By: Julian Hy M.D.   On: 06/12/2017 03:11    Anti-infectives: Anti-infectives    Start     Dose/Rate Route Frequency Ordered Stop   06/05/2017 1400  cefTRIAXone (ROCEPHIN) 1 g in dextrose 5 % 50 mL IVPB     1 g 100 mL/hr over 30 Minutes Intravenous Every 24 hours 06/07/2017 1334        Assessment/Plan:  1 - RIGHT Mid Ureteral Stone - agree most efficient means of management in this man with shoulder injury and right ureteral stone and mental handicap would be combined surgery with right ureteroscopic stone manipulation at same time as shoulder surgery. Risks, benefits, alternatives, expected peri-op course discussed with pt and father who want to proceed today.   [facility], Johnn Krasowski 06/20/2017

## 2017-06-25 NOTE — Anesthesia Procedure Notes (Signed)
Procedure Name: Intubation Date/Time: 06/23/2017 2:00 PM Performed by: Lawerence Dery, Virgel Gess Pre-anesthesia Checklist: Patient identified, Emergency Drugs available, Suction available, Patient being monitored and Timeout performed Patient Re-evaluated:Patient Re-evaluated prior to induction Oxygen Delivery Method: Circle system utilized Preoxygenation: Pre-oxygenation with 100% oxygen Induction Type: IV induction Ventilation: Mask ventilation without difficulty Laryngoscope Size: Mac and 4 Grade View: Grade I Tube type: Oral Tube size: 7.5 mm Number of attempts: 1 Airway Equipment and Method: Stylet Placement Confirmation: ETT inserted through vocal cords under direct vision,  positive ETCO2,  CO2 detector and breath sounds checked- equal and bilateral Secured at: 22 cm Tube secured with: Tape Dental Injury: Teeth and Oropharynx as per pre-operative assessment

## 2017-06-26 DIAGNOSIS — Z96611 Presence of right artificial shoulder joint: Secondary | ICD-10-CM

## 2017-06-26 DIAGNOSIS — W19XXXD Unspecified fall, subsequent encounter: Secondary | ICD-10-CM

## 2017-06-26 LAB — BASIC METABOLIC PANEL
ANION GAP: 7 (ref 5–15)
BUN: 38 mg/dL — AB (ref 6–20)
CALCIUM: 7.7 mg/dL — AB (ref 8.9–10.3)
CO2: 23 mmol/L (ref 22–32)
Chloride: 109 mmol/L (ref 101–111)
Creatinine, Ser: 2.16 mg/dL — ABNORMAL HIGH (ref 0.61–1.24)
GFR calc Af Amer: 35 mL/min — ABNORMAL LOW (ref >60)
GFR, EST NON AFRICAN AMERICAN: 30 mL/min — AB (ref >60)
GLUCOSE: 169 mg/dL — AB (ref 65–99)
Potassium: 4 mmol/L (ref 3.5–5.1)
Sodium: 139 mmol/L (ref 135–145)

## 2017-06-26 LAB — CBC WITH DIFFERENTIAL/PLATELET
BASOS ABS: 0 10*3/uL (ref 0.0–0.1)
BASOS PCT: 0 %
EOS PCT: 0 %
Eosinophils Absolute: 0 10*3/uL (ref 0.0–0.7)
HCT: 34.8 % — ABNORMAL LOW (ref 39.0–52.0)
Hemoglobin: 11.7 g/dL — ABNORMAL LOW (ref 13.0–17.0)
Lymphocytes Relative: 4 %
Lymphs Abs: 0.5 10*3/uL — ABNORMAL LOW (ref 0.7–4.0)
MCH: 29.3 pg (ref 26.0–34.0)
MCHC: 33.6 g/dL (ref 30.0–36.0)
MCV: 87.2 fL (ref 78.0–100.0)
MONO ABS: 1.1 10*3/uL — AB (ref 0.1–1.0)
Monocytes Relative: 7 %
NEUTROS ABS: 13.6 10*3/uL — AB (ref 1.7–7.7)
Neutrophils Relative %: 89 %
PLATELETS: 180 10*3/uL (ref 150–400)
RBC: 3.99 MIL/uL — ABNORMAL LOW (ref 4.22–5.81)
RDW: 15.6 % — AB (ref 11.5–15.5)
WBC: 15.2 10*3/uL — ABNORMAL HIGH (ref 4.0–10.5)

## 2017-06-26 LAB — URINE CULTURE

## 2017-06-26 MED ORDER — SODIUM CHLORIDE 0.9 % IV BOLUS (SEPSIS)
1000.0000 mL | Freq: Once | INTRAVENOUS | Status: AC
  Administered 2017-06-26: 1000 mL via INTRAVENOUS

## 2017-06-26 NOTE — Progress Notes (Signed)
Subjective: 1 Day Post-Op Procedure(s) (LRB): reverse right SHOULDER ARTHROPLASTY (Right) CYSTOSCOPY WITH RETROGRADE PYELOGRAM, URETEROSCOPY AND STENT PLACEMENT basket extraction stone (Right) Patient reports pain as mild to right shoulder.  Resting well.  Denies Sp, SOB, or calf pain. No F/C  Objective: Vital signs in last 24 hours: Temp:  [94 F (34.4 C)-100.1 F (37.8 C)] 99.1 F (37.3 C) (08/26 0236) Pulse Rate:  [60-83] 60 (08/26 0236) Resp:  [14-26] 20 (08/26 0236) BP: (113-160)/(57-80) 113/59 (08/26 0236) SpO2:  [92 %-100 %] 94 % (08/26 0236)  Intake/Output from previous day: 08/25 0701 - 08/26 0700 In: 2469.3 [P.O.:180; I.V.:2289.3] Out: 895 [Urine:745; Blood:150] Intake/Output this shift: No intake/output data recorded.   Recent Labs  06/11/2017 0352 06/02/2017 1508 06/06/2017 0505 06/26/17 0523  HGB 15.1 14.2 13.2 11.7*    Recent Labs  06/17/2017 0505 06/26/17 0523  WBC 10.5 15.2*  RBC 4.53 3.99*  HCT 39.9 34.8*  PLT 175 180    Recent Labs  06/01/2017 0505 06/26/17 0523  NA 140 139  K 3.3* 4.0  CL 109 109  CO2 25 23  BUN 26* 38*  CREATININE 1.69* 2.16*  GLUCOSE 131* 169*  CALCIUM 7.9* 7.7*    Recent Labs  06/02/2017 0945  INR 1.36    Well nourished. Alert and oriented x3. RRR, Lungs clear, BS x4. Abdomen soft and non tender. Calf soft and non tender. Right shoulder dressing C/D/I. No DVT signs. Compartment soft. No signs of infection.  Right UE grossly neurovascular intact. S;ling in good position. Assessment/Plan: 1 Day Post-Op Procedure(s) (LRB): reverse right SHOULDER ARTHROPLASTY (Right) CYSTOSCOPY WITH RETROGRADE PYELOGRAM, URETEROSCOPY AND STENT PLACEMENT basket extraction stone (Right) Pain control SLing to RUE OT Continue current care Possible D/c the first of the week   STILWELL, BRYSON L 06/26/2017, 8:04 AM

## 2017-06-26 NOTE — Evaluation (Signed)
Physical Therapy Evaluation Patient Details Name: Eduardo Herman MRN: [id number] DOB: [birth year] Today's Date: 06/26/2017   History of Present Illness  66 y.o. male with medical history significant of Down syndrome, mental retardation, morbid obesity, hypothyroidism, PVD, tobacco abuse, CK-3, chronic venous insufficiency in legs, who presents with fall, right shoulder pain, abdominal pain and distention. s/p R reverse TSA and R ureteral stent placement and stone removal 06/08/2017   Clinical Impression  Pt admitted with above diagnosis. Pt currently with functional limitations due to the deficits listed below (see PT Problem List). +2 max assist for supine to sit, pt requires mod A for sitting balance 2* heavy R lateral lean, pt unable to maintain neutral in sitting despite manual/verbal cues. Sitting tolerance limited by dizziness, BP 116/63 sitting.  Pt will benefit from skilled PT to increase their independence and safety with mobility to allow discharge to the venue listed below.       Follow Up Recommendations SNF;Supervision/Assistance - 24 hour    Equipment Recommendations  Other (comment) (TBD at SNF)    Recommendations for Other Services       Precautions / Restrictions Precautions Precautions: Shoulder Type of Shoulder Precautions: conservative  Shoulder Interventions: Shoulder sling/immobilizer;Off for dressing/bathing/exercises;At all times Precaution Comments: No PROM and AROM; ROM elbow, wrist, hand.  sling at all times except for ADLs and exercise  Required Braces or Orthoses: Sling Restrictions Weight Bearing Restrictions: Yes RUE Weight Bearing: Non weight bearing      Mobility  Bed Mobility Overal bed mobility: Needs Assistance Bed Mobility: Supine to Sit;Sit to Supine     Supine to sit: +2 for physical assistance;+2 for safety/equipment;HOB elevated;Max assist Sit to supine: +2 for physical assistance;+2 for safety/equipment;Total assist   General bed mobility  comments: max A to raise trunk (pt 40%), pt assisted with advancing BLEs to EOB; total assist for sit to supine, pt with R lateral lean in sitting requiring mod A; pt assisted with scooting up in bed  with trapeze  Transfers                 General transfer comment: unable 2* poor sitting balance  Ambulation/Gait                Stairs            Wheelchair Mobility    Modified Rankin (Stroke Patients Only)       Balance Overall balance assessment: Needs assistance Sitting-balance support: Single extremity supported;Feet supported Sitting balance-Leahy Scale: Poor Sitting balance - Comments: frequent verbal/manual cues to correct R lateral lean, pt unable to maintain neutral more than 1-2 seconds, lean became more pronounced with time, pt reported dizziness in sitting, BP 116/63, SaO2 93% RA, HR 80s, sat on EOB x 10 min Postural control: Right lateral lean                                   Pertinent Vitals/Pain Pain Assessment: Faces Faces Pain Scale: Hurts even more Pain Location: R shoulder with activity Pain Descriptors / Indicators: Grimacing Pain Intervention(s): Monitored during session;Ice applied;Repositioned    Home Living Family/patient expects to be discharged to:: Private residence Living Arrangements: Parent     Home Access: Stairs to enter;Ramped entrance              Prior Function           Comments: pt stated he walks with a  walker     Hand Dominance        Extremity/Trunk Assessment   Upper Extremity Assessment Upper Extremity Assessment: Defer to OT evaluation (NWB RUE, RUE in sling)    Lower Extremity Assessment Lower Extremity Assessment: RLE deficits/detail;LLE deficits/detail;Difficult to assess due to impaired cognition (B knee ext -3/5, pt unable to follow commands for MMT, able to actively PF/DF ankles, assisted with moving LEs to EOB)    Cervical / Trunk Assessment Cervical / Trunk  Assessment: Kyphotic  Communication   Communication: Expressive difficulties (slurred speech, difficult to understand)  Cognition Arousal/Alertness: Lethargic Behavior During Therapy: WFL for tasks assessed/performed Overall Cognitive Status: No family/caregiver present to determine baseline cognitive functioning                                 General Comments: follows one step commands inconsistently      General Comments      Exercises     Assessment/Plan    PT Assessment Patient needs continued PT services  PT Problem List Decreased strength;Decreased activity tolerance;Decreased balance;Pain;Decreased cognition;Decreased mobility       PT Treatment Interventions DME instruction;Gait training;Functional mobility training;Balance training;Therapeutic exercise;Therapeutic activities;Patient/family education    PT Goals (Current goals can be found in the Care Plan section)  Acute Rehab PT Goals Patient Stated Goal: to walk PT Goal Formulation: With patient Time For Goal Achievement: 07/10/17 Potential to Achieve Goals: Fair    Frequency Min 3X/week   Barriers to discharge        Co-evaluation PT/OT/SLP Co-Evaluation/Treatment: Yes Reason for Co-Treatment: Complexity of the patient's impairments (multi-system involvement);For patient/therapist safety;Necessary to address cognition/behavior during functional activity   OT goals addressed during session: ADL's and self-care;Strengthening/ROM       AM-PAC PT "6 Clicks" Daily Activity  Outcome Measure Difficulty turning over in bed (including adjusting bedclothes, sheets and blankets)?: Unable Difficulty moving from lying on back to sitting on the side of the bed? : Unable Difficulty sitting down on and standing up from a chair with arms (e.g., wheelchair, bedside commode, etc,.)?: Unable Help needed moving to and from a bed to chair (including a wheelchair)?: Total Help needed walking in hospital room?:  Total Help needed climbing 3-5 steps with a railing? : Total 6 Click Score: 6    End of Session   Activity Tolerance: Treatment limited secondary to medical complications (Comment) (dizziness in sitting) Patient left: in bed;with call bell/phone within reach;with chair alarm set Nurse Communication: Mobility status PT Visit Diagnosis: History of falling (Z91.81);Difficulty in walking, not elsewhere classified (R26.2);Dizziness and giddiness (R42)    Time: 4536-4680 PT Time Calculation (min) (ACUTE ONLY): 28 min   Charges:   PT Evaluation $PT Eval Moderate Complexity: 1 Mod     PT G Codes:          Philomena Doheny 06/26/2017, 9:49 AM [phone]

## 2017-06-26 NOTE — Evaluation (Signed)
Occupational Therapy Evaluation Patient Details Name: Eduardo Herman MRN: [id number] DOB: 07-28-1951 Today's Date: 06/26/2017    History of Present Illness 66 y.o. male with medical history significant of Down syndrome, mental retardation, morbid obesity, hypothyroidism, PVD, tobacco abuse, CK-3, chronic venous insufficiency in legs, who presents with fall, right shoulder pain, abdominal pain and distention. s/p R reverse TSA and R ureteral stent placement and stone removal 06/01/2017    Clinical Impression   Pt admitted with above. He demonstrates the below listed deficits and will benefit from continued OT to maximize safety and independence with BADLs.  Pt presents to OT with impaired cognition (unsure of baseline as no family present); impaired sitting balance (requires max a to sit EOB due to have Rt lateral lean - pt requires max cues and assist to correct); pain Rt UE; decreased activity tolerance and generalized weakness.  He requires max - total A for ADLs, and max A +2 for bed mobility.  Per chart, he lives with his father.  Feel he will need SNF level rehab at discharge.       Follow Up Recommendations  SNF;Supervision/Assistance - 24 hour    Equipment Recommendations  3 in 1 bedside commode;Tub/shower bench    Recommendations for Other Services       Precautions / Restrictions Precautions Precautions: Shoulder Type of Shoulder Precautions: conservative  Shoulder Interventions: Shoulder sling/immobilizer;Off for dressing/bathing/exercises;At all times Precaution Comments: No PROM and AROM; ROM elbow, wrist, hand.  sling at all times except for ADLs and exercise  Required Braces or Orthoses: Sling Restrictions Weight Bearing Restrictions: Yes RUE Weight Bearing: Non weight bearing      Mobility Bed Mobility Overal bed mobility: Needs Assistance Bed Mobility: Supine to Sit;Sit to Supine     Supine to sit: +2 for physical assistance;+2 for safety/equipment;HOB  elevated;Max assist Sit to supine: +2 for physical assistance;+2 for safety/equipment;Total assist   General bed mobility comments: max A to raise trunk (pt 40%), pt assisted with advancing BLEs to EOB; total assist for sit to supine, pt with R lateral lean in sitting requiring mod A; pt assisted with scooting up in bed  with trapeze  Transfers                 General transfer comment: unable 2* poor sitting balance    Balance Overall balance assessment: Needs assistance Sitting-balance support: Single extremity supported;Feet supported Sitting balance-Leahy Scale: Poor Sitting balance - Comments: frequent verbal/manual cues to correct R lateral lean, pt unable to maintain neutral more than 1-2 seconds, lean became more pronounced with time, pt reported dizziness in sitting, BP 116/63, SaO2 93% RA, HR 80s, sat on EOB x 10 min Postural control: Right lateral lean                                 ADL either performed or assessed with clinical judgement   ADL Overall ADL's : Needs assistance/impaired Eating/Feeding: Bed level;Moderate assistance Eating/Feeding Details (indicate cue type and reason): Pt with difficulty movin cup to mouth with Lt UE  Grooming: Wash/dry hands;Wash/dry face;Moderate assistance;Bed level   Upper Body Bathing: Maximal assistance;Bed level   Lower Body Bathing: Total assistance;Bed level   Upper Body Dressing : Total assistance;Bed level   Lower Body Dressing: Total assistance;Bed level   Toilet Transfer: Total assistance Toilet Transfer Details (indicate cue type and reason): unable to safely attempt  Toileting- Clothing Manipulation and Hygiene: Total  assistance;Bed level       Functional mobility during ADLs: Maximal assistance;+2 for physical assistance (bed mobility only )       Vision         Perception     Praxis      Pertinent Vitals/Pain Pain Assessment: Faces Faces Pain Scale: Hurts even more Pain Location: R  shoulder with activity Pain Descriptors / Indicators: Grimacing Pain Intervention(s): Monitored during session;Limited activity within patient's tolerance     Hand Dominance Right   Extremity/Trunk Assessment Upper Extremity Assessment Upper Extremity Assessment: RUE deficits/detail RUE Deficits / Details: Sling in place.  PROM of elbow, wrist and hand WFL.  Shoulder not tested due to precautions    Lower Extremity Assessment Lower Extremity Assessment: Defer to PT evaluation   Cervical / Trunk Assessment Cervical / Trunk Assessment: Kyphotic   Communication Communication Communication: Expressive difficulties (slurred speech; difficult to understand )   Cognition Arousal/Alertness: Lethargic Behavior During Therapy: WFL for tasks assessed/performed Overall Cognitive Status: No family/caregiver present to determine baseline cognitive functioning                                 General Comments: follows one step commands incosistently and with increased time.  Requires max cues for problem solving    General Comments       Exercises Exercises: Shoulder Shoulder Exercises Elbow Flexion: PROM;Right;10 reps;Supine Elbow Extension: PROM;Right;10 reps;Supine Wrist Flexion: PROM;Right;10 reps;Supine Wrist Extension: PROM;Right;10 reps;Supine Digit Composite Flexion: PROM;AROM;Right;10 reps;Supine Composite Extension: PROM;AROM;Right;Supine;10 reps   Shoulder Instructions      Home Living Family/patient expects to be discharged to:: Private residence Living Arrangements: Parent Available Help at Discharge: Family;Available 24 hours/day Type of Home: House Home Access: Ramped entrance;Stairs to enter     Home Layout: One level                   Additional Comments: info gathered from chart review as pt unable to provide info       Prior Functioning/Environment          Comments: unsure if pt required assist, as he is unable to communicate this.   He does state he ambulates with RW         OT Problem List: Decreased strength;Decreased range of motion;Decreased activity tolerance;Impaired balance (sitting and/or standing);Decreased cognition;Decreased safety awareness;Decreased knowledge of use of DME or AE;Decreased knowledge of precautions;Impaired UE functional use;Pain;Obesity      OT Treatment/Interventions: Self-care/ADL training;Therapeutic exercise;DME and/or AE instruction;Therapeutic activities;Cognitive remediation/compensation;Patient/family education;Balance training    OT Goals(Current goals can be found in the care plan section) Acute Rehab OT Goals Patient Stated Goal: didn't state  OT Goal Formulation: With patient Time For Goal Achievement: 07/03/17 Potential to Achieve Goals: Good ADL Goals Pt Will Perform Eating: with set-up;with adaptive utensils;sitting;bed level Pt Will Perform Grooming: with set-up;with supervision;sitting Pt Will Transfer to Toilet: with mod assist;stand pivot transfer;bedside commode Pt Will Perform Toileting - Clothing Manipulation and hygiene: with max assist;sit to/from stand  OT Frequency: Min 2X/week   Barriers to D/C: Decreased caregiver support  unsure that father can provide necessary level of assist        Co-evaluation PT/OT/SLP Co-Evaluation/Treatment: Yes Reason for Co-Treatment: Complexity of the patient's impairments (multi-system involvement);For patient/therapist safety;Necessary to address cognition/behavior during functional activity   OT goals addressed during session: ADL's and self-care      AM-PAC PT "6 Clicks" Daily Activity  Outcome Measure Help from another person eating meals?: A Lot Help from another person taking care of personal grooming?: A Lot Help from another person toileting, which includes using toliet, bedpan, or urinal?: Total Help from another person bathing (including washing, rinsing, drying)?: A Lot Help from another person to put on  and taking off regular upper body clothing?: Total Help from another person to put on and taking off regular lower body clothing?: Total 6 Click Score: 9   End of Session Equipment Utilized During Treatment: Other (comment) (sling ) Nurse Communication: Mobility status  Activity Tolerance: Other (comment) (limited by dizziness, and impaired balance ) Patient left: in bed;with call bell/phone within reach;with bed alarm set  OT Visit Diagnosis: Pain;Muscle weakness (generalized) (M62.81) Pain - Right/Left: Right Pain - part of body: Shoulder                Time: 0388-8280 OT Time Calculation (min): 26 min Charges:  OT General Charges $OT Visit: 1 Procedure OT Evaluation $OT Eval Moderate Complexity: 1 Procedure G-Codes:     Lucille Passy, OTR/L [phone]   Selmer, Eldra Word M 06/26/2017, 11:25 AM

## 2017-06-26 NOTE — Progress Notes (Signed)
1 Day Post-Op   Subjective/Chief Complaint:  1 - RIGHT Mid Ureteral Stone - s/p RIGHT ureteroscopy and tethered stent 8/25 for 90mm Rt mid ureteral stone with mod hydro. Stone was solitary. This was performed at same time as Rt shoulder ORIF.   2 - Low Grade Fevers - Tm <100.5 after ORIF and right ureteroscopy. Did have some upepr airway congestion pre-op. Pre-op UA without infectious parameters. UCX 8/24 pending. On empiric rocephin.   3 - Chronic Renal Insufficiency - Cr 2's x years. Acte stone this hospitalization (now treated) but no hydro at baseline.   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 Cr at approximate baseline. Pain controlled. Some low grade fever overnight.    Objective: Vital signs in last 24 hours: Temp:  [94 F (34.4 C)-100.1 F (37.8 C)] 99.1 F (37.3 C) (08/26 0236) Pulse Rate:  [60-83] 60 (08/26 0236) Resp:  [14-26] 20 (08/26 0236) BP: (113-160)/(57-80) 113/59 (08/26 0236) SpO2:  [92 %-100 %] 94 % (08/26 0236) Last BM Date:  (some days ago as per father)  Intake/Output from previous day: 08/25 0701 - 08/26 0700 In: 2469.3 [P.O.:180; I.V.:2289.3] Out: 895 [Urine:745; Blood:150] Intake/Output this shift: No intake/output data recorded.  General appearance: alert, cooperative and stigmata of Down's. Very cooperative. At recent baseline.  Head: Normocephalic, without obvious abnormality, atraumatic Eyes: negative Nose: Nares normal. Septum midline. Mucosa normal. No drainage or sinus tenderness. Throat: lips, mucosa, and tongue normal; teeth and gums normal Neck: supple, symmetrical, trachea midline Back: symmetric, no curvature. ROM normal. No CVA tenderness. Resp: non-labored on room air.  Cardio: Nl rate GI: soft, non-tender; bowel sounds normal; no masses,  no organomegaly and stable moderate truncal obesity.  Male genitalia: normal, buried penis. Goely in placed with medium yellow urine. stent tether fashioned to foley.  Extremities: extremities normal, atraumatic,  no cyanosis or edema Pulses: 2+ and symmetric Skin: Skin color, texture, turgor normal. No rashes or lesions Lymph nodes: Cervical, supraclavicular, and axillary nodes normal. Neurologic: Mental status: AOx2.  Lab Results:   Recent Labs  06/14/2017 0505 06/26/17 0523  WBC 10.5 15.2*  HGB 13.2 11.7*  HCT 39.9 34.8*  PLT 175 180   BMET  Recent Labs  06/06/2017 0505 06/26/17 0523  NA 140 139  K 3.3* 4.0  CL 109 109  CO2 25 23  GLUCOSE 131* 169*  BUN 26* 38*  CREATININE 1.69* 2.16*  CALCIUM 7.9* 7.7*   PT/INR  Recent Labs  06/04/2017 0945  LABPROT 16.9*  INR 1.36   ABG No results for input(s): PHART, HCO3 in the last 72 hours.  Invalid input(s): PCO2, PO2  Studies/Results: Ct Shoulder Right Wo Contrast  Result Date: 06/12/2017 CLINICAL DATA:  Proximal right humerus fracture. EXAM: CT OF THE UPPER RIGHT EXTREMITY WITHOUT CONTRAST TECHNIQUE: Multidetector CT imaging of the upper right extremity was performed according to the standard protocol. COMPARISON:  Radiographs dated 06/08/2017 FINDINGS: Bones/Joint/Cartilage There is a comminuted angulated and displaced fracture of right humeral head and proximal neck. There is no dislocation of the humeral head. The majority of the articular surface of the humeral head is intact. The scapula is intact.  AC joint appears normal. IMPRESSION: Comminuted angulated, impacted and displaced fracture of the right humeral head and neck. Electronically Signed   By: Lorriane Shire M.D.   On: 06/10/2017 09:28   Dg Shoulder Right Port  Result Date: 06/06/2017 CLINICAL DATA:  Status post reversed right shoulder arthroplasty EXAM: PORTABLE RIGHT SHOULDER COMPARISON:  None. FINDINGS: Right shoulder arthroplasty is  noted in satisfactory position. No acute bony abnormality is seen. IMPRESSION: Postsurgical change without acute abnormality. Electronically Signed   By: Inez Catalina M.D.   On: 06/01/2017 17:59   Dg C-arm 1-60 Min-no Report  Result  Date: 06/08/2017 Fluoroscopy was utilized by the requesting physician.  No radiographic interpretation.    Anti-infectives: Anti-infectives    Start     Dose/Rate Route Frequency Ordered Stop   06/29/2017 2000  ceFAZolin (ANCEF) IVPB 2g/100 mL premix  Status:  Discontinued     2 g 200 mL/hr over 30 Minutes Intravenous Every 6 hours 06/03/2017 1824 06/04/2017 1835   06/22/2017 1316  ceFAZolin (ANCEF) 3 g in dextrose 5 % 50 mL IVPB     3 g 130 mL/hr over 30 Minutes Intravenous 30 min pre-op 06/05/2017 1316 06/17/2017 1359   06/06/2017 1400  cefTRIAXone (ROCEPHIN) 1 g in dextrose 5 % 50 mL IVPB     1 g 100 mL/hr over 30 Minutes Intravenous Every 24 hours 06/07/2017 1334        Assessment/Plan:  1 - RIGHT Mid Ureteral Stone - now stone free. We will remove foley and tethered stent when afebrile x 24 hrs.   2 - Low Grade Fevers - DDX atelectasis, UTI, Upper respiratory infection. Agree with current ABX pending UCX results. Keep current GU tubes / drains to ensure maximal drainage.   3 - Chronic Renal Insufficiency - likely medical renal disease by history, labs, imaging. At GFR baseline.  Will follow.    [facility], Velma Hanna 06/26/2017

## 2017-06-26 NOTE — Progress Notes (Signed)
PROGRESS NOTE [facility]   Eduardo Herman   HEN:[id number] DOB: Mar 05, 1951  DOA: 07/01/2017 PCP: Leonard Downing, MD   Brief Narrative:  Eduardo Herman  is a 66 year old male with medical history significant of Down syndrome, MR, hypothyroidism, chronic kidney disease stage III chronic venous insufficiency who presented to the emergency department after mechanical fall and right shoulder pain. Patient was found to have R shoulder fracture orthopedic surgery was consulted and is planning to perform surgery  CT of the abdomen was done due to abdominal pain she revealed a 5 mm kidney stone with possible pyelonephritis. Patient was started on IV abx, flomax and urology was consulted   Subjective: Patient doing well, has no complaints this status post shoulder repair and right ureteroscopy  Assessment & Plan: Fracture of right humerus - status post mechanical fall Status post right shoulder arthroplasty on 06/12/2017 Continue management per orthopedic surgery Pain medication as needed PT/OT recommending SNF  Right ureteral stone with possible pyelonephritis Status post cystoscopy with right ureteroscopy and stent placement Continue antibiotics, urine culture grew Proteus mirabilis. Patient was febrile overnight so urology recommended to keep the Foley until patient is afebrile 24 hour period Continue Flomax  Acute kidney injury on Chronic kidney disease stage III Baseline creatinine on 2016 was 1.79, although patient on admission was found to have creatinine of 2.13 creatinine has responded to IV fluids which meets criteria for acute renal failure. Creatinine increase today, could be related to surgical procedure, will continue IVF for now Avoid nephrotoxic agent Check BMP in the morning  Hypokalemia - resolved  Hypertension Blood pressure continues to be stable during hospital stay Continue atenolol and Cardizem Holding Lasix for now given acute renal  failure  Leukocytosis Likely reactive to surgical procedure Patient on antibiotics, will continue to monitor  Down syndrome with MR Stable, father legal guardian  DVT prophylaxis: SCDs Code Status: Full code Family Communication: None at bedside Disposition Plan: Likely SNF when cleared by urology and orthopedic surgery  Consultants:   Orthopedic surgery  Urology  Procedures:     Antimicrobials: Anti-infectives    Start     Dose/Rate Route Frequency Ordered Stop   06/07/2017 2000  ceFAZolin (ANCEF) IVPB 2g/100 mL premix  Status:  Discontinued     2 g 200 mL/hr over 30 Minutes Intravenous Every 6 hours 06/20/2017 1824 06/12/2017 1835   06/05/2017 1316  ceFAZolin (ANCEF) 3 g in dextrose 5 % 50 mL IVPB     3 g 130 mL/hr over 30 Minutes Intravenous 30 min pre-op 06/17/2017 1316 06/18/2017 1359   06/07/2017 1400  cefTRIAXone (ROCEPHIN) 1 g in dextrose 5 % 50 mL IVPB     1 g 100 mL/hr over 30 Minutes Intravenous Every 24 hours 07/01/2017 1334         Objective: Vitals:   06/26/17 0236 06/26/17 0933 06/26/17 1031 06/26/17 1435  BP: (!) 113/59 116/63 (!) 102/50 113/65  Pulse: 60  (!) 58 (!) 57  Resp: 20  18 16   Temp: 99.1 F (37.3 C)  (!) 97.5 F (36.4 C) 97.6 F (36.4 C)  TempSrc: Axillary  Axillary Oral  SpO2: 94% 93% 93% 93%  Weight:      Height:        Intake/Output Summary (Last 24 hours) at 06/26/17 1700 Last data filed at 06/26/17 1437  Gross per 24 hour  Intake             1198 ml  Output  495 ml  Net              703 ml   Filed Weights   06/15/2017 0110  Weight: 134.9 kg (297 lb 6.4 oz)    Examination:  General: NAD Cardiovascular: RRR, S1/S2 +, no rubs, no gallops Respiratory: CTA bilaterally, no wheezing, no rhonchi Genitourinary: Foley in place, urine yellow Extremities: Chronic venous stasis, bilateral edema bilaterally  Data Reviewed: I have personally reviewed following labs and imaging studies  CBC:  Recent Labs Lab 06/19/2017 0352  06/17/2017 1508 06/05/2017 0505 06/26/17 0523  WBC 17.2* 12.9* 10.5 15.2*  NEUTROABS 13.9*  --  7.9* 13.6*  HGB 15.1 14.2 13.2 11.7*  HCT 44.8 42.3 39.9 34.8*  MCV 88.0 87.9 88.1 87.2  PLT 192 162 175 761   Basic Metabolic Panel:  Recent Labs Lab 06/06/2017 0352 06/12/2017 1508 06/09/2017 0505 06/26/17 0523  NA 140 140 140 139  K 3.3* 3.2* 3.3* 4.0  CL 108 109 109 109  CO2 24 23 25 23   GLUCOSE 159* 157* 131* 169*  BUN 23* 23* 26* 38*  CREATININE 2.13* 1.96* 1.69* 2.16*  CALCIUM 8.3* 8.0* 7.9* 7.7*   GFR: Estimated Creatinine Clearance: 44.6 mL/min (A) (by C-G formula based on SCr of 2.16 mg/dL (H)). Liver Function Tests: No results for input(s): AST, ALT, ALKPHOS, BILITOT, PROT, ALBUMIN in the last 168 hours.  Recent Labs Lab 06/02/2017 0945  LIPASE 18   No results for input(s): AMMONIA in the last 168 hours. Coagulation Profile:  Recent Labs Lab 06/14/2017 0945  INR 1.36   Cardiac Enzymes:  Recent Labs Lab 06/14/2017 0353 06/15/2017 0945  CKTOTAL  --  85  TROPONINI <0.03  --    BNP (last 3 results) No results for input(s): PROBNP in the last 8760 hours. HbA1C: No results for input(s): HGBA1C in the last 72 hours. CBG: No results for input(s): GLUCAP in the last 168 hours. Lipid Profile: No results for input(s): CHOL, HDL, LDLCALC, TRIG, CHOLHDL, LDLDIRECT in the last 72 hours. Thyroid Function Tests: No results for input(s): TSH, T4TOTAL, FREET4, T3FREE, THYROIDAB in the last 72 hours. Anemia Panel: No results for input(s): VITAMINB12, FOLATE, FERRITIN, TIBC, IRON, RETICCTPCT in the last 72 hours. Sepsis Labs: No results for input(s): PROCALCITON, LATICACIDVEN in the last 168 hours.  Recent Results (from the past 240 hour(s))  Culture, Urine     Status: Abnormal (Preliminary result)   Collection Time: 06/21/2017  3:40 AM  Result Value Ref Range Status   Specimen Description URINE, RANDOM  Final   Special Requests NONE  Final   Culture 50,000 COLONIES/mL  PROTEUS MIRABILIS (A)  Final   Report Status PENDING  Incomplete  Surgical pcr screen     Status: None   Collection Time: 06/14/2017  8:44 PM  Result Value Ref Range Status   MRSA, PCR NEGATIVE NEGATIVE Final   Staphylococcus aureus NEGATIVE NEGATIVE Final    Comment:        The Xpert SA Assay (FDA approved for NASAL specimens in patients over 16 years of age), is one component of a comprehensive surveillance program.  Test performance has been validated by [facility] for patients greater than or equal to 93 year old. It is not intended to diagnose infection nor to guide or monitor treatment.   Culture, Urine     Status: Abnormal   Collection Time: 06/02/2017 12:36 AM  Result Value Ref Range Status   Specimen Description URINE, CATHETERIZED  Final   Special  Requests NONE  Final   Culture (A)  Final    <10,000 COLONIES/mL Performed at [facility], [place] [address], [place], [place] [address]    Report Status 06/26/2017 FINAL  Final      Radiology Studies: Dg Shoulder Right Port  Result Date: 06/22/2017 CLINICAL DATA:  Status post reversed right shoulder arthroplasty EXAM: PORTABLE RIGHT SHOULDER COMPARISON:  None. FINDINGS: Right shoulder arthroplasty is noted in satisfactory position. No acute bony abnormality is seen. IMPRESSION: Postsurgical change without acute abnormality. Electronically Signed   By: Inez Catalina M.D.   On: 06/30/2017 17:59   Dg C-arm 1-60 Min-no Report  Result Date: 06/24/2017 Fluoroscopy was utilized by the requesting physician.  No radiographic interpretation.      Scheduled Meds: . atenolol  100 mg Oral Daily  . diltiazem  360 mg Oral Daily  . docusate sodium  100 mg Oral BID  . levothyroxine  225 mcg Oral QAC breakfast  . nicotine  21 mg Transdermal Daily  . omega-3 acid ethyl esters  1 g Oral Daily  . polyethylene glycol  17 g Oral Daily  . potassium chloride  20 mEq Oral Once  . potassium chloride SA  20 mEq Oral Daily  . tamsulosin   0.4 mg Oral Daily   Continuous Infusions: . sodium chloride 50 mL/hr at 06/24/2017 2343  . cefTRIAXone (ROCEPHIN)  IV Stopped (06/26/17 1430)  . dextrose 5 % and 0.45% NaCl Stopped (06/28/2017 1245)  . methocarbamol (ROBAXIN)  IV       LOS: 2 days    Time spent: Total of 15 minutes spent with pt, greater than 50% of which was spent in discussion of  treatment, counseling and coordination of care    Chipper Oman, MD Pager: Text Page via [email or web address]   If 7PM-7AM, please contact night-coverage [email or web address] 06/26/2017, 5:00 PM

## 2017-06-27 ENCOUNTER — Encounter (HOSPITAL_COMMUNITY): Payer: Self-pay | Admitting: Orthopedic Surgery

## 2017-06-27 LAB — CBC WITH DIFFERENTIAL/PLATELET
BASOS ABS: 0 10*3/uL (ref 0.0–0.1)
BASOS PCT: 0 %
Eosinophils Absolute: 0 10*3/uL (ref 0.0–0.7)
Eosinophils Relative: 0 %
HEMATOCRIT: 33.3 % — AB (ref 39.0–52.0)
HEMOGLOBIN: 10.9 g/dL — AB (ref 13.0–17.0)
LYMPHS PCT: 7 %
Lymphs Abs: 1 10*3/uL (ref 0.7–4.0)
MCH: 29 pg (ref 26.0–34.0)
MCHC: 32.7 g/dL (ref 30.0–36.0)
MCV: 88.6 fL (ref 78.0–100.0)
Monocytes Absolute: 1.7 10*3/uL — ABNORMAL HIGH (ref 0.1–1.0)
Monocytes Relative: 10 %
NEUTROS ABS: 13.3 10*3/uL — AB (ref 1.7–7.7)
NEUTROS PCT: 83 %
Platelets: 199 10*3/uL (ref 150–400)
RBC: 3.76 MIL/uL — AB (ref 4.22–5.81)
RDW: 15.9 % — AB (ref 11.5–15.5)
WBC: 16 10*3/uL — AB (ref 4.0–10.5)

## 2017-06-27 LAB — URINE CULTURE

## 2017-06-27 LAB — RENAL FUNCTION PANEL
ALBUMIN: 2.3 g/dL — AB (ref 3.5–5.0)
ANION GAP: 8 (ref 5–15)
BUN: 48 mg/dL — ABNORMAL HIGH (ref 6–20)
CALCIUM: 7.6 mg/dL — AB (ref 8.9–10.3)
CO2: 20 mmol/L — ABNORMAL LOW (ref 22–32)
Chloride: 112 mmol/L — ABNORMAL HIGH (ref 101–111)
Creatinine, Ser: 1.84 mg/dL — ABNORMAL HIGH (ref 0.61–1.24)
GFR calc non Af Amer: 36 mL/min — ABNORMAL LOW (ref >60)
GFR, EST AFRICAN AMERICAN: 42 mL/min — AB (ref >60)
Glucose, Bld: 158 mg/dL — ABNORMAL HIGH (ref 65–99)
PHOSPHORUS: 2.9 mg/dL (ref 2.5–4.6)
POTASSIUM: 3.6 mmol/L (ref 3.5–5.1)
SODIUM: 140 mmol/L (ref 135–145)

## 2017-06-27 MED ORDER — CEFAZOLIN SODIUM-DEXTROSE 1-4 GM/50ML-% IV SOLN
1.0000 g | Freq: Three times a day (TID) | INTRAVENOUS | Status: DC
  Administered 2017-06-28 (×2): 1 g via INTRAVENOUS
  Filled 2017-06-27 (×3): qty 50

## 2017-06-27 NOTE — Progress Notes (Signed)
PROGRESS NOTE [facility]   Eduardo Herman   HYQ:[id number] DOB: Mar 17, 1951  DOA: 06/14/2017 PCP: Leonard Downing, MD   Brief Narrative:  Eduardo Herman  is a 66 year old male with medical history significant of Down syndrome, MR, hypothyroidism, chronic kidney disease stage III chronic venous insufficiency who presented to the emergency department after mechanical fall and right shoulder pain. Patient was found to have R shoulder fracture orthopedic surgery was consulted and is planning to perform surgery  CT of the abdomen was done due to abdominal pain she revealed a 5 mm kidney stone with possible pyelonephritis. Patient was started on IV abx, flomax and urology was consulted   Subjective: Patient have no complaints today, foley is out. Afebrile.   Assessment & Plan: Fracture of right humerus - status post mechanical fall Status post right shoulder arthroplasty on 06/10/2017 Orthopedic surgery cleared for discharge  Pain medication as needed Continue PT/OT as tolerated   Right ureteral stone with possible pyelonephritis Status post cystoscopy with right ureteroscopy and stent placement Continue antibiotics, urine culture grew Proteus mirabilis pansensitive  Will deescalate rocephin to ancef  Foley is out  Monitor WBC if stable can d/c in AM  Continue Flomax  Acute kidney injury on Chronic kidney disease stage III Baseline creatinine on 2016 was 1.79, although patient on admission was found to have creatinine of 2.13 creatinine has responded to IV fluids which meets criteria for acute renal failure. Creatinine stable close to baseline  Patient tolerating PO well, will d/c IVF  Avoid nephrotoxic agent  Hypokalemia - resolved  Hypertension Blood pressure continues to be stable during hospital stay Continue atenolol and Cardizem Holding Lasix for now given acute renal failure  Leukocytosis Likely reactive to surgical procedure Patient on antibiotics, will  continue to monitor  Down syndrome with MR Stable, father legal guardian  DVT prophylaxis: SCDs Code Status: Full code Family Communication: None at bedside Disposition Plan: Likely SNF when cleared by urology and orthopedic surgery  Consultants:   Orthopedic surgery  Urology  Procedures:     Antimicrobials: Anti-infectives    Start     Dose/Rate Route Frequency Ordered Stop   06/21/2017 2000  ceFAZolin (ANCEF) IVPB 2g/100 mL premix  Status:  Discontinued     2 g 200 mL/hr over 30 Minutes Intravenous Every 6 hours 06/16/2017 1824 06/27/2017 1835   06/04/2017 1316  ceFAZolin (ANCEF) 3 g in dextrose 5 % 50 mL IVPB     3 g 130 mL/hr over 30 Minutes Intravenous 30 min pre-op 06/26/2017 1316 06/17/2017 1359   06/18/2017 1400  cefTRIAXone (ROCEPHIN) 1 g in dextrose 5 % 50 mL IVPB  Status:  Discontinued     1 g 100 mL/hr over 30 Minutes Intravenous Every 24 hours 07/01/2017 1334 06/27/17 1838       Objective: Vitals:   06/26/17 2202 06/27/17 0535 06/27/17 1013 06/27/17 1427  BP: (!) 126/56 127/62 (!) 142/72 126/64  Pulse: (!) 57 (!) 59 60 66  Resp: 16 18  18   Temp: 98.6 F (37 C) (!) 97.4 F (36.3 C)  98 F (36.7 C)  TempSrc: Oral Oral  Oral  SpO2: 91% 93%  96%  Weight:      Height:        Intake/Output Summary (Last 24 hours) at 06/27/17 1839 Last data filed at 06/27/17 1825  Gross per 24 hour  Intake             1770 ml  Output  975 ml  Net              795 ml   Filed Weights   06/02/2017 0110  Weight: 134.9 kg (297 lb 6.4 oz)    Examination:  General: Pt is alert, awake, not in acute distress Cardiovascular: RRR, S1/S2 +, no rubs, no gallops Respiratory: CTA bilaterally, no wheezing, no rhonchi Abdominal: Soft, NT, ND, bowel sounds + Extremities: no edema, R shoulder on sling   Data Reviewed: I have personally reviewed following labs and imaging studies  CBC:  Recent Labs Lab 07/01/2017 0352 07/01/2017 1508 07/01/2017 0505 06/26/17 0523 06/27/17 0349    WBC 17.2* 12.9* 10.5 15.2* 16.0*  NEUTROABS 13.9*  --  7.9* 13.6* 13.3*  HGB 15.1 14.2 13.2 11.7* 10.9*  HCT 44.8 42.3 39.9 34.8* 33.3*  MCV 88.0 87.9 88.1 87.2 88.6  PLT 192 162 175 180 379   Basic Metabolic Panel:  Recent Labs Lab 06/18/2017 0352 06/06/2017 1508 07/01/2017 0505 06/26/17 0523 06/27/17 0349  NA 140 140 140 139 140  K 3.3* 3.2* 3.3* 4.0 3.6  CL 108 109 109 109 112*  CO2 24 23 25 23  20*  GLUCOSE 159* 157* 131* 169* 158*  BUN 23* 23* 26* 38* 48*  CREATININE 2.13* 1.96* 1.69* 2.16* 1.84*  CALCIUM 8.3* 8.0* 7.9* 7.7* 7.6*  PHOS  --   --   --   --  2.9   GFR: Estimated Creatinine Clearance: 52.3 mL/min (A) (by C-G formula based on SCr of 1.84 mg/dL (H)). Liver Function Tests:  Recent Labs Lab 06/27/17 0349  ALBUMIN 2.3*    Recent Labs Lab 06/06/2017 0945  LIPASE 18   No results for input(s): AMMONIA in the last 168 hours. Coagulation Profile:  Recent Labs Lab 06/05/2017 0945  INR 1.36   Cardiac Enzymes:  Recent Labs Lab 06/17/2017 0353 06/28/2017 0945  CKTOTAL  --  85  TROPONINI <0.03  --    BNP (last 3 results) No results for input(s): PROBNP in the last 8760 hours. HbA1C: No results for input(s): HGBA1C in the last 72 hours. CBG: No results for input(s): GLUCAP in the last 168 hours. Lipid Profile: No results for input(s): CHOL, HDL, LDLCALC, TRIG, CHOLHDL, LDLDIRECT in the last 72 hours. Thyroid Function Tests: No results for input(s): TSH, T4TOTAL, FREET4, T3FREE, THYROIDAB in the last 72 hours. Anemia Panel: No results for input(s): VITAMINB12, FOLATE, FERRITIN, TIBC, IRON, RETICCTPCT in the last 72 hours. Sepsis Labs: No results for input(s): PROCALCITON, LATICACIDVEN in the last 168 hours.  Recent Results (from the past 240 hour(s))  Culture, Urine     Status: Abnormal   Collection Time: 06/11/2017  3:40 AM  Result Value Ref Range Status   Specimen Description URINE, RANDOM  Final   Special Requests NONE  Final   Culture 50,000  COLONIES/mL PROTEUS MIRABILIS (A)  Final   Report Status 06/27/2017 FINAL  Final   Organism ID, Bacteria PROTEUS MIRABILIS (A)  Final      Susceptibility   Proteus mirabilis - MIC*    AMPICILLIN <=2 SENSITIVE Sensitive     CEFAZOLIN <=4 SENSITIVE Sensitive     CEFTRIAXONE <=1 SENSITIVE Sensitive     CIPROFLOXACIN <=0.25 SENSITIVE Sensitive     GENTAMICIN <=1 SENSITIVE Sensitive     IMIPENEM 0.5 SENSITIVE Sensitive     NITROFURANTOIN >=512 RESISTANT Resistant     TRIMETH/SULFA <=20 SENSITIVE Sensitive     AMPICILLIN/SULBACTAM <=2 SENSITIVE Sensitive     PIP/TAZO <=4 SENSITIVE Sensitive     *  50,000 COLONIES/mL PROTEUS MIRABILIS  Surgical pcr screen     Status: None   Collection Time: 06/15/2017  8:44 PM  Result Value Ref Range Status   MRSA, PCR NEGATIVE NEGATIVE Final   Staphylococcus aureus NEGATIVE NEGATIVE Final    Comment:        The Xpert SA Assay (FDA approved for NASAL specimens in patients over 44 years of age), is one component of a comprehensive surveillance program.  Test performance has been validated by [facility] for patients greater than or equal to 89 year old. It is not intended to diagnose infection nor to guide or monitor treatment.   Culture, Urine     Status: Abnormal   Collection Time: 06/04/2017 12:36 AM  Result Value Ref Range Status   Specimen Description URINE, CATHETERIZED  Final   Special Requests NONE  Final   Culture (A)  Final    <10,000 COLONIES/mL Performed at [facility], [place] [address] [place], [place] [address]    Report Status 06/26/2017 FINAL  Final      Radiology Studies: No results found.    Scheduled Meds: . atenolol  100 mg Oral Daily  . diltiazem  360 mg Oral Daily  . docusate sodium  100 mg Oral BID  . levothyroxine  225 mcg Oral QAC breakfast  . nicotine  21 mg Transdermal Daily  . omega-3 acid ethyl esters  1 g Oral Daily  . polyethylene glycol  17 g Oral Daily  . potassium chloride  20 mEq Oral Once  .  potassium chloride SA  20 mEq Oral Daily  . tamsulosin  0.4 mg Oral Daily   Continuous Infusions: . methocarbamol (ROBAXIN)  IV       LOS: 3 days    Time spent: Total of 15 minutes spent with pt, greater than 50% of which was spent in discussion of  treatment, counseling and coordination of care   Chipper Oman, MD Pager: Text Page via [email or web address]   If 7PM-7AM, please contact night-coverage [email or web address] 06/27/2017, 6:39 PM

## 2017-06-27 NOTE — Progress Notes (Signed)
Occupational Therapy Treatment Patient Details Name: Eduardo Herman MRN: [id number] DOB: Dec 08, 1950 Today's Date: 06/27/2017    History of present illness 66 y.o. male with medical history significant of Down syndrome, mental retardation, morbid obesity, hypothyroidism, PVD, tobacco abuse, CK-3, chronic venous insufficiency in legs, who presents with fall, right shoulder pain, abdominal pain and distention. s/p R reverse TSA and R ureteral stent placement and stone removal 06/20/2017    OT comments  Performed L elbow, wrist and hand exercise and readjusted sling in bed with HOB raised  Follow Up Recommendations  SNF;Supervision/Assistance - 24 hour    Equipment Recommendations  3 in 1 bedside commode;Tub/shower bench       Precautions / Restrictions Precautions Precautions: Shoulder Type of Shoulder Precautions: conservative  Shoulder Interventions: Shoulder sling/immobilizer;Off for dressing/bathing/exercises;At all times Precaution Comments: No PROM and AROM; ROM elbow, wrist, hand.  sling at all times except for ADLs and exercise  Required Braces or Orthoses: Sling Restrictions Weight Bearing Restrictions: Yes RUE Weight Bearing: Non weight bearing       Mobility Bed Mobility            repositioned in bed with max A      Transfers    NT                      ADL either performed or assessed with clinical judgement        Vision Patient Visual Report: No change from baseline            Cognition Arousal/Alertness: Awake/alert Behavior During Therapy: WFL for tasks assessed/performed Overall Cognitive Status: No family/caregiver present to determine baseline cognitive functioning                                          Exercises Shoulder Exercises Elbow Flexion: PROM;Right;10 reps;Supine Elbow Extension: PROM;Right;10 reps;Supine Wrist Flexion: PROM;Right;10 reps;Supine Wrist Extension: PROM;Right;10 reps;Supine Digit  Composite Flexion: PROM;AROM;Right;10 reps;Supine Composite Extension: PROM;AROM;Right;Supine;10 reps           Pertinent Vitals/ Pain       Pain Assessment: Faces Pain Score: 4  Pain Location: R shoulder with activity Pain Descriptors / Indicators: Grimacing Pain Intervention(s): Monitored during session;Limited activity within patient's tolerance         Frequency  Min 2X/week        Progress Toward Goals  OT Goals(current goals can now be found in the care plan section)  Progress towards OT goals: Progressing toward goals     Plan Discharge plan remains appropriate       AM-PAC PT "6 Clicks" Daily Activity     Outcome Measure   Help from another person eating meals?: A Lot Help from another person taking care of personal grooming?: A Lot Help from another person toileting, which includes using toliet, bedpan, or urinal?: Total Help from another person bathing (including washing, rinsing, drying)?: A Lot Help from another person to put on and taking off regular upper body clothing?: Total Help from another person to put on and taking off regular lower body clothing?: Total 6 Click Score: 9    End of Session Equipment Utilized During Treatment: Other (comment) (sling )  OT Visit Diagnosis: Pain;Muscle weakness (generalized) (M62.81) Pain - Right/Left: Right Pain - part of body: Shoulder   Activity Tolerance Other (comment) (limited by dizziness, and impaired balance )  Patient Left in bed;with call bell/phone within reach;with bed alarm set   Nurse Communication Mobility status        Time: 1447-1500 OT Time Calculation (min): 13 min  Charges: OT General Charges $OT Visit: 1 Visit OT Treatments $Therapeutic Exercise: 8-22 mins  [place], [place]   Betsy Pries 06/27/2017, 3:37 PM

## 2017-06-27 NOTE — Clinical Social Work Note (Signed)
Clinical Social Work Assessment  Patient Details  Name: Eduardo Herman MRN: [id number] Date of Birth: [birth year]  Date of referral:  06/27/17               Reason for consult:  Facility Placement                Permission sought to share information with:  Family Supports Permission granted to share information::  Yes, Verbal Permission Granted  Name::     father Alpheus Stiff::     Relationship::     Contact Information:     Housing/Transportation Living arrangements for the past 2 months:  Single Family Home Source of Information:  Patient, Parent Patient Interpreter Needed:  None Criminal Activity/Legal Involvement Pertinent to Current Situation/Hospitalization:  No - Comment as needed Significant Relationships:  Siblings, Parents, Friend, Neighbor Lives with:  Parents, Siblings Do you feel safe going back to the place where you live?  Yes Need for family participation in patient care:  Yes (Comment) (pt with reported I/DD- father primary decision maker)  Care giving concerns: pt from home where he resides with his father and sister. Pt has noted hx of I/DD dx (father reports "Mild MR- I think when his IQ was tested years ago it was in the 60s")- notes pt uses walker around the house and completes ADLs independently, with father prompting at times. Father states house is equipped with rails where needed. Father states pt cannot read/write. Is his own legal guardian but sister is POA and father "takes care of everything for him, makes the decisions." Father states, "We agree going to rehab to work on him getting back to able to get out of bed and dress himself and all that would help- I don't know how much I can do for him until he can walk around more." Notes pt "wants to come home though." Has support system including his sister, neighbors, and friends.    Social Worker assessment / plan:  CSW consulted for potential SNF placement. Met with pt at bedside. Pt with dx "Downs  syndrome, MR" mild per father. Father is main contact and while pt was displeased hearing recommendation for rehab, he advised CSW to consult his father for decision. Father agreed to rehab referrals- preferences for [facility], [facility], or [place].  CSW completed FL2 and made referrals. Passr went to manual review- will follow to attempt obtaining passr.  Plan: SNF at [place]- referrals and PASSR pending.   Employment status:  Disabled (Comment on whether or not currently receiving Disability) (recieves disability per father) Insurance information:  Medicare PT Recommendations:  [place], 24 Hour Supervision Information / Referral to community resources:  [place]  Patient/Family's Response to care:  Both pt and father appreciative of care  Patient/Family's Understanding of and Emotional Response to Diagnosis, Current Treatment, and Prognosis:  Pt able to demonstrate understanding of recommendation for rehab to CSW- however he does not demonstrate full understanding of situation- became upset briefly at mention of not returning immediately home at DC- stating "my dad can take care of me" although father voices feeling he cannot manage pt's current level of care needs.   Emotional Assessment Appearance:  Appears older than stated age Attitude/Demeanor/Rapport:   (pleasant) Affect (typically observed):  Calm Orientation:  Oriented to Self, Oriented to Place, Oriented to Situation, Oriented to  Time Alcohol / Substance use:  Not Applicable Psych involvement (Current and /or in the community):  No (  Comment)  Discharge Needs  Concerns to be addressed:  Discharge Planning Concerns Readmission within the last 30 days:  No Current discharge risk:  Dependent with Mobility Barriers to Discharge:  Continued Medical Work up, Programmer, applications ([place])   Nila Nephew, LCSW 06/27/2017, 10:38 AM  [phone]

## 2017-06-27 NOTE — Progress Notes (Signed)
   Subjective: 2 Days Post-Op Procedure(s) (LRB): reverse right SHOULDER ARTHROPLASTY (Right) CYSTOSCOPY WITH RETROGRADE PYELOGRAM, URETEROSCOPY AND STENT PLACEMENT basket extraction stone (Right)  Right shoulder doing well Mild pain/soreness in the shoulder Denies any numbness or tingling distally Otherwise doing fair Patient reports pain as mild.  Objective:   VITALS:   Vitals:   06/26/17 2202 06/27/17 0535  BP: (!) 126/56 127/62  Pulse: (!) 57 (!) 59  Resp: 16 18  Temp: 98.6 F (37 C) (!) 97.4 F (36.3 C)  SpO2: 91% 93%    Right shoulder incision healing well nv intact distally Sling and dressing in place  LABS  Recent Labs  06/08/2017 0505 06/26/17 0523 06/27/17 0349  HGB 13.2 11.7* 10.9*  HCT 39.9 34.8* 33.3*  WBC 10.5 15.2* 16.0*  PLT 175 180 199     Recent Labs  06/17/2017 0505 06/26/17 0523 06/27/17 0349  NA 140 139 140  K 3.3* 4.0 3.6  BUN 26* 38* 48*  CREATININE 1.69* 2.16* 1.84*  GLUCOSE 131* 169* 158*     Assessment/Plan: 2 Days Post-Op Procedure(s) (LRB): reverse right SHOULDER ARTHROPLASTY (Right) CYSTOSCOPY WITH RETROGRADE PYELOGRAM, URETEROSCOPY AND STENT PLACEMENT basket extraction stone (Right) Arm is stable currently Pt doing well from orthopedic standpoint May d/c when appropriate from medical standpoint Continue gentle therapy  Pain management as needed    Merla Riches, MPAS, PA-C  06/27/2017, 7:46 AM

## 2017-06-27 NOTE — Progress Notes (Signed)
2 Days Post-Op   Subjective/Chief Complaint:  1 - RIGHT Mid Ureteral Stone - s/p RIGHT ureteroscopy and tethered stent 8/25 for 63mm Rt mid ureteral stone with mod hydro. Stone was solitary. This was performed at same time as Rt shoulder ORIF. Rt JJ stent and foley removed 8/27.   2 - Low Grade Fevers / Bacteruria - Tm <100.5 after ORIF and right ureteroscopy. Did have some upepr airway congestion pre-op. Pre-op UA without infectious parameters. [place] 8/24 with low growth proteus / pending. . On empiric rocephin.   3 - Chronic Renal Insufficiency - Cr 2's x years. Acte stone this hospitalization (now treated) but no hydro at baseline.   Today "Eduardo Herman" is stable. NO fevers overnight.    Objective: Vital signs in last 24 hours: Temp:  [97.4 F (36.3 C)-98.6 F (37 C)] 97.4 F (36.3 C) (08/27 0535) Pulse Rate:  [57-60] 60 (08/27 1013) Resp:  [16-18] 18 (08/27 0535) BP: (113-142)/(56-72) 142/72 (08/27 1013) SpO2:  [91 %-93 %] 93 % (08/27 0535) Last BM Date:  (some days ago as per father)  Intake/Output from previous day: 08/26 0701 - 08/27 0700 In: 1550 [P.O.:1260; I.V.:240; IV Piggyback:50] Out: 1025 [Urine:1025] Intake/Output this shift: Total I/O In: 370 [P.O.:120; I.V.:250] Out: 350 [Urine:350]  General appearance: alert, cooperative and stigmata of Down's. Very cooperative. At recent baseline. Father at bedside.  Head: Normocephalic, without obvious abnormality, atraumatic Eyes: negative Nose: Nares normal. Septum midline. Mucosa normal. No drainage or sinus tenderness. Throat: lips, mucosa, and tongue normal; teeth and gums normal Neck: supple, symmetrical, trachea midline Back: symmetric, no curvature. ROM normal. No CVA tenderness. Resp: non-labored on room air.  Cardio: Nl rate GI: soft, non-tender; bowel sounds normal; no masses,  no organomegaly and stable moderate truncal obesity.  Male genitalia: normal, buried penis. Foley in placed with medium yellow urine. stent  tether fashioned to foley, both removed today, inspected and intact.  Extremities: extremities normal, atraumatic, no cyanosis or edema Pulses: 2+ and symmetric Skin: Skin color, texture, turgor normal. No rashes or lesions Lymph nodes: Cervical, supraclavicular, and axillary nodes normal. Neurologic: Mental status: AOx2.   Lab Results:   Recent Labs  06/26/17 0523 06/27/17 0349  WBC 15.2* 16.0*  HGB 11.7* 10.9*  HCT 34.8* 33.3*  PLT 180 199   BMET  Recent Labs  06/26/17 0523 06/27/17 0349  NA 139 140  K 4.0 3.6  CL 109 112*  CO2 23 20*  GLUCOSE 169* 158*  BUN 38* 48*  CREATININE 2.16* 1.84*  CALCIUM 7.7* 7.6*   PT/INR No results for input(s): LABPROT, INR in the last 72 hours. ABG No results for input(s): PHART, HCO3 in the last 72 hours.  Invalid input(s): PCO2, PO2  Studies/Results: Dg Shoulder Right Port  Result Date: 06/27/2017 CLINICAL DATA:  Status post reversed right shoulder arthroplasty EXAM: PORTABLE RIGHT SHOULDER COMPARISON:  None. FINDINGS: Right shoulder arthroplasty is noted in satisfactory position. No acute bony abnormality is seen. IMPRESSION: Postsurgical change without acute abnormality. Electronically Signed   By: Inez Catalina M.D.   On: 06/16/2017 17:59   Dg C-arm 1-60 Min-no Report  Result Date: 06/08/2017 Fluoroscopy was utilized by the requesting physician.  No radiographic interpretation.    Anti-infectives: Anti-infectives    Start     Dose/Rate Route Frequency Ordered Stop   06/20/2017 2000  ceFAZolin (ANCEF) IVPB 2g/100 mL premix  Status:  Discontinued     2 g 200 mL/hr over 30 Minutes Intravenous Every 6 hours 06/24/2017 1824  06/01/2017 1835   06/01/2017 1316  ceFAZolin (ANCEF) 3 g in dextrose 5 % 50 mL IVPB     3 g 130 mL/hr over 30 Minutes Intravenous 30 min pre-op 06/24/2017 1316 06/06/2017 1359   07/01/2017 1400  cefTRIAXone (ROCEPHIN) 1 g in dextrose 5 % 50 mL IVPB     1 g 100 mL/hr over 30 Minutes Intravenous Every 24 hours 06/09/2017  1334        Assessment/Plan:   1 - RIGHT Mid Ureteral Stone - now stone free, stent and catheter now out as well.   2 - Low Grade Fevers - DDX atelectasis, UTI, Upper respiratory infection. Agree with current ABX pending final UCX results.   3 - Chronic Renal Insufficiency - likely medical renal disease by history, labs, imaging. At GFR baseline.  [facility], Monea Pesantez 06/27/2017

## 2017-06-27 NOTE — NC FL2 (Signed)
[place] LEVEL OF CARE SCREENING TOOL     IDENTIFICATION  Patient Name: Eduardo Herman Birthdate: [birth year] Sex: male Admission Date (Current Location): 06/07/2017  [facility] and [place] Number:  Herbalist and Address:  [facility],  [place] [address] [place]      Provider Number: [id number]  Attending Physician Name and Address:  Patrecia Pour, Christean Grief, MD  Relative Name and Phone Number:       Current Level of Care: Hospital Recommended Level of Care: [place] Prior Approval Number:    Date Approved/Denied:   PASRR Number:    Discharge Plan: SNF    Current Diagnoses: Patient Active Problem List   Diagnosis Date Noted  . S/P shoulder replacement, right 06/30/2017  . Fall 06/30/2017  . Tobacco abuse 06/08/2017  . Fracture of humerus anatomical neck, right, closed, initial encounter 06/19/2017  . Abdominal distension 06/23/2017  . Abdominal pain 06/23/2017  . Hypothyroidism 06/10/2015  . Hypokalemia   . CKD (chronic kidney disease), stage III 06/04/2015  . AKI (acute kidney injury) ([place]) 06/04/2015  . Cellulitis of right leg 05/27/2015  . Essential hypertension, benign 11/28/2013  . Prediabetes 11/28/2013  . Hyperlipemia 11/28/2013  . Angioedema 11/04/2012  . Edema, lower extremity 11/04/2012  . Benign carcinoid tumor of the duodenum 03/26/2010  . Mental retardation 03/26/2010    Orientation RESPIRATION BLADDER Height & Weight     Self, Situation, Place  Normal Indwelling catheter, Continent Weight: 297 lb 6.4 oz (134.9 kg) Height:  5\' 8"  (172.7 cm)  BEHAVIORAL SYMPTOMS/MOOD NEUROLOGICAL BOWEL NUTRITION STATUS      Continent Diet (heart healthy)  AMBULATORY STATUS COMMUNICATION OF NEEDS Skin   Extensive Assist Verbally Surgical wounds (right shoulder closed incision) ABD dressing                       Personal Care Assistance Level of Assistance  Bathing, Feeding, Dressing Bathing  Assistance: Limited assistance Feeding assistance: Independent Dressing Assistance: Maximum assistance     Functional Limitations Info  Sight, Hearing, Speech Sight Info: Adequate Hearing Info: Adequate Speech Info: Impaired (garbled)    SPECIAL CARE FACTORS FREQUENCY  PT (By licensed PT), OT (By licensed OT)     PT Frequency: 5x OT Frequency: 5x            Contractures Contractures Info: Not present    Additional Factors Info  Code Status, Allergies Code Status Info: full code Allergies Info: lisinopril           Current Medications (06/27/2017):  This is the current hospital active medication list Current Facility-Administered Medications  Medication Dose Route Frequency Provider Last Rate Last Dose  . 0.9 %  sodium chloride infusion   Intravenous Continuous Netta Cedars, MD 50 mL/hr at 06/27/17 0808 50 mL/hr at 06/27/17 0808  . acetaminophen (TYLENOL) tablet 650 mg  650 mg Oral Q6H PRN Netta Cedars, MD   650 mg at 06/27/17 0147   Or  . acetaminophen (TYLENOL) suppository 650 mg  650 mg Rectal Q6H PRN Netta Cedars, MD      . acetaminophen (TYLENOL) tablet 650 mg  650 mg Oral Q6H PRN Ivor Costa, MD      . atenolol (TENORMIN) tablet 100 mg  100 mg Oral Daily Ivor Costa, MD   100 mg at 06/27/17 1013  . bisacodyl (DULCOLAX) suppository 10 mg  10 mg Rectal Daily PRN Netta Cedars, MD      .  cefTRIAXone (ROCEPHIN) 1 g in dextrose 5 % 50 mL IVPB  1 g Intravenous Q24H Eudelia Bunch, RPH   Stopped at 06/26/17 1430  . dextrose 5 %-0.45 % sodium chloride infusion   Intravenous Continuous Patrecia Pour, Christean Grief, MD   Stopped at 07/01/2017 1245  . diltiazem (CARDIZEM CD) 24 hr capsule 360 mg  360 mg Oral Daily Patrecia Pour, Christean Grief, MD   360 mg at 06/27/17 1018  . docusate sodium (COLACE) capsule 100 mg  100 mg Oral BID Netta Cedars, MD   100 mg at 06/27/17 1019  . hydrALAZINE (APRESOLINE) injection 5 mg  5 mg Intravenous Q2H PRN Ivor Costa, MD      . HYDROmorphone (DILAUDID)  injection 1 mg  1 mg Intravenous Q4H PRN Patrecia Pour, Christean Grief, MD   1 mg at 06/29/2017 1851  . levothyroxine (SYNTHROID, LEVOTHROID) tablet 225 mcg  225 mcg Oral QAC breakfast Thomes Lolling, RPH   225 mcg at 06/27/17 7564  . menthol-cetylpyridinium (CEPACOL) lozenge 3 mg  1 lozenge Oral PRN Netta Cedars, MD       Or  . phenol (CHLORASEPTIC) mouth spray 1 spray  1 spray Mouth/Throat PRN Netta Cedars, MD      . methocarbamol (ROBAXIN) tablet 500 mg  500 mg Oral Q6H PRN Netta Cedars, MD       Or  . methocarbamol (ROBAXIN) 500 mg in dextrose 5 % 50 mL IVPB  500 mg Intravenous Q6H PRN Netta Cedars, MD      . methocarbamol (ROBAXIN) tablet 500 mg  500 mg Oral Q8H PRN Ivor Costa, MD      . metoCLOPramide (REGLAN) tablet 5-10 mg  5-10 mg Oral Q8H PRN Netta Cedars, MD       Or  . metoCLOPramide (REGLAN) injection 5-10 mg  5-10 mg Intravenous Q8H PRN Netta Cedars, MD      . morphine 2 MG/ML injection 2-4 mg  2-4 mg Intravenous Q2H PRN Netta Cedars, MD      . nicotine (NICODERM CQ - dosed in mg/24 hours) patch 21 mg  21 mg Transdermal Daily Ivor Costa, MD   21 mg at 06/27/17 1022  . omega-3 acid ethyl esters (LOVAZA) capsule 1 g  1 g Oral Daily Netta Cedars, MD   1 g at 06/27/17 1019  . ondansetron (ZOFRAN) tablet 4 mg  4 mg Oral Q6H PRN Netta Cedars, MD       Or  . ondansetron [facility]) injection 4 mg  4 mg Intravenous Q6H PRN Netta Cedars, MD      . oxyCODONE-acetaminophen (PERCOCET/ROXICET) 5-325 MG per tablet 2 tablet  2 tablet Oral Q4H PRN Ivor Costa, MD   2 tablet at 06/27/17 1020  . polyethylene glycol (MIRALAX / GLYCOLAX) packet 17 g  17 g Oral Daily Ivor Costa, MD   17 g at 06/27/17 1012  . polyethylene glycol (MIRALAX / GLYCOLAX) packet 17 g  17 g Oral Daily PRN Netta Cedars, MD      . potassium chloride 20 MEQ/15ML (10%) solution 20 mEq  20 mEq Oral Once Ivor Costa, MD   Stopped at 06/06/2017 0530  . potassium chloride SA (K-DUR,KLOR-CON) CR tablet 20 mEq  20 mEq Oral Daily Netta Cedars, MD   20 mEq at 06/27/17 1019  . senna-docusate (Senokot-S) tablet 1 tablet  1 tablet Oral QHS PRN Ivor Costa, MD      . tamsulosin (FLOMAX) capsule 0.4 mg  0.4 mg Oral Daily Patrecia Pour, Christean Grief, MD  0.4 mg at 06/27/17 1019  . zolpidem (AMBIEN) tablet 5 mg  5 mg Oral QHS PRN Ivor Costa, MD         Discharge Medications: Please see discharge summary for a list of discharge medications.  Relevant Imaging Results:  Relevant Lab Results:   Additional Information SS# [id number]  Nila Nephew, LCSW

## 2017-06-27 NOTE — Care Management Important Message (Signed)
Important Message  Patient Details  Name: BRYSIN TOWERY MRN: [id number] Date of Birth: 05-12-1951   Medicare Important Message Given:  Yes    Kerin Salen 06/27/2017, 10:53 AMImportant Message  Patient Details  Name: DRAVON NOTT MRN: [id number] Date of Birth: 04-19-1951   Medicare Important Message Given:  Yes    Kerin Salen 06/27/2017, 10:53 AM

## 2017-06-27 NOTE — Progress Notes (Signed)
PHARMACY NOTE -  CEFAZOLIN  Pharmacy has been assisting with dosing of Cefazolin for UTI. Patient has been on Ceftriaxone 1gm IV q24h with last dose charted on 06/27/17 @ 13:44  Plan: Cefazolin 1gm IV q8h and need for further dosage adjustment appears unlikely at present.    Will sign off at this time.  Please reconsult if a change in clinical status warrants re-evaluation of dosage.  Leone Haven, PharmD 06/27/17 @ 18:58

## 2017-06-28 ENCOUNTER — Inpatient Hospital Stay (HOSPITAL_COMMUNITY): Payer: Medicare Other

## 2017-06-28 ENCOUNTER — Encounter (HOSPITAL_COMMUNITY): Payer: Self-pay | Admitting: Orthopedic Surgery

## 2017-06-28 LAB — BASIC METABOLIC PANEL
Anion gap: 11 (ref 5–15)
BUN: 41 mg/dL — AB (ref 6–20)
CHLORIDE: 109 mmol/L (ref 101–111)
CO2: 21 mmol/L — ABNORMAL LOW (ref 22–32)
CREATININE: 1.53 mg/dL — AB (ref 0.61–1.24)
Calcium: 8.2 mg/dL — ABNORMAL LOW (ref 8.9–10.3)
GFR calc Af Amer: 53 mL/min — ABNORMAL LOW (ref >60)
GFR calc non Af Amer: 45 mL/min — ABNORMAL LOW (ref >60)
Glucose, Bld: 167 mg/dL — ABNORMAL HIGH (ref 65–99)
POTASSIUM: 3.1 mmol/L — AB (ref 3.5–5.1)
SODIUM: 141 mmol/L (ref 135–145)

## 2017-06-28 LAB — CBC
HEMATOCRIT: 40.9 % (ref 39.0–52.0)
Hemoglobin: 13.7 g/dL (ref 13.0–17.0)
MCH: 29.1 pg (ref 26.0–34.0)
MCHC: 33.5 g/dL (ref 30.0–36.0)
MCV: 87 fL (ref 78.0–100.0)
PLATELETS: 271 10*3/uL (ref 150–400)
RBC: 4.7 MIL/uL (ref 4.22–5.81)
RDW: 15.8 % — AB (ref 11.5–15.5)
WBC: 18.4 10*3/uL — AB (ref 4.0–10.5)

## 2017-06-28 MED ORDER — POTASSIUM CHLORIDE 10 MEQ/100ML IV SOLN
10.0000 meq | INTRAVENOUS | Status: AC
  Administered 2017-06-28 (×2): 10 meq via INTRAVENOUS
  Filled 2017-06-28 (×2): qty 100

## 2017-06-28 MED ORDER — FUROSEMIDE 10 MG/ML IJ SOLN
40.0000 mg | Freq: Once | INTRAMUSCULAR | Status: AC
  Administered 2017-06-29: 40 mg via INTRAVENOUS
  Filled 2017-06-28: qty 4

## 2017-06-28 NOTE — Progress Notes (Signed)
Occupational Therapy Treatment Patient Details Name: Eduardo Herman MRN: [id number] DOB: [birth year] Today's Date: 06/28/2017    History of present illness 66 y.o. male with medical history significant of Down syndrome, mental retardation, morbid obesity, hypothyroidism, PVD, tobacco abuse, CK-3, chronic venous insufficiency in legs, who presents with fall, right shoulder pain, abdominal pain and distention. s/p R reverse TSA and R ureteral stent placement and stone removal 06/17/2017    OT comments  Pt did sit EOB with OT this day. Pt fatigued quickly and needed to return to supine.   Follow Up Recommendations  SNF;Supervision/Assistance - 24 hour    Equipment Recommendations  3 in 1 bedside commode;Tub/shower bench    Recommendations for Other Services      Precautions / Restrictions Precautions Precautions: Shoulder Type of Shoulder Precautions: conservative  Shoulder Interventions: Shoulder sling/immobilizer;Off for dressing/bathing/exercises;At all times Precaution Comments: No PROM and AROM; ROM elbow, wrist, hand.  sling at all times except for ADLs and exercise  Required Braces or Orthoses: Sling Restrictions Weight Bearing Restrictions: Yes RUE Weight Bearing: Non weight bearing       Mobility Bed Mobility Overal bed mobility: Needs Assistance Bed Mobility: Supine to Sit;Sit to Supine     Supine to sit: +2 for physical assistance;+2 for safety/equipment;HOB elevated;Max assist Sit to supine: +2 for physical assistance;+2 for safety/equipment;Max assist      Transfers                 General transfer comment: NT    Balance Overall balance assessment: Needs assistance Sitting-balance support: Single extremity supported;Feet supported Sitting balance-Leahy Scale: Fair   Postural control: Right lateral lean (initially)                                 ADL either performed or assessed with clinical judgement   ADL Overall ADL's : Needs  assistance/impaired Eating/Feeding: Sitting;Cueing for safety;Cueing for sequencing;Moderate assistance   Grooming: Wash/dry face;Moderate assistance;Sitting                                 General ADL Comments: pt sat EOB for approx 7 minutes.  Pt did initially have a lean to the R but was able to maintain sitting with mod A with hands on as well as VC.  Pt fatigued quickly and needed to return to supine.  CNA Aed OT with these transitions     Vision Patient Visual Report: No change from baseline     Perception     Praxis      Cognition Arousal/Alertness: Awake/alert Behavior During Therapy: WFL for tasks assessed/performed Overall Cognitive Status: No family/caregiver present to determine baseline cognitive functioning                                          Exercises Shoulder Exercises Elbow Flexion: PROM;Right;10 reps;Supine Elbow Extension: PROM;Right;10 reps;Supine Wrist Flexion: PROM;Right;10 reps;Supine Wrist Extension: PROM;Right;10 reps;Supine Digit Composite Flexion: PROM;AROM;Right;10 reps;Supine Composite Extension: PROM;AROM;Right;Supine;10 reps   Shoulder Instructions   repositioned sling          Pertinent Vitals/ Pain       Pain Assessment: Faces Faces Pain Scale: Hurts little more Pain Location: R shoulder with activity Pain Descriptors / Indicators: Guarding;Grimacing Pain Intervention(s): Limited activity within patient's  tolerance;Repositioned (repostioned sling)         Frequency  Min 2X/week        Progress Toward Goals  OT Goals(current goals can now be found in the care plan section)  Progress towards OT goals: Progressing toward goals     Plan Discharge plan remains appropriate    Co-evaluation                 AM-PAC PT "6 Clicks" Daily Activity     Outcome Measure   Help from another person eating meals?: A Lot Help from another person taking care of personal grooming?: A Lot Help  from another person toileting, which includes using toliet, bedpan, or urinal?: Total Help from another person bathing (including washing, rinsing, drying)?: A Lot Help from another person to put on and taking off regular upper body clothing?: Total Help from another person to put on and taking off regular lower body clothing?: Total 6 Click Score: 9    End of Session Equipment Utilized During Treatment: Other (comment) (sling )  OT Visit Diagnosis: Pain;Muscle weakness (generalized) (M62.81) Pain - Right/Left: Right Pain - part of body: Shoulder   Activity Tolerance Patient limited by fatigue (limited by dizziness, and impaired balance )   Patient Left in bed;with call bell/phone within reach;with bed alarm set   Nurse Communication Mobility status;Need for lift equipment        Time: 1055-1110 OT Time Calculation (min): 15 min  Charges: OT General Charges $OT Visit: 1 Visit OT Treatments $Therapeutic Activity: 8-22 mins  [place], [place]   Betsy Pries 06/28/2017, 11:32 AM

## 2017-06-28 NOTE — Progress Notes (Signed)
Provided bed offers to pt/father- selects [facility] for SNF. Selected facility in the HUB and confirmed bed availability with admissions.   Pt's PASSR in manual revew- CSW faxed requested documents to PASSR and awaiting reply.  Sharren Bridge, MSW, LCSW Clinical Social Work 06/28/2017 [phone]

## 2017-06-28 NOTE — Progress Notes (Signed)
Physical Therapy Treatment Patient Details Name: Eduardo Herman MRN: [id number] DOB: [birth year] Today's Date: 06/28/2017    History of Present Illness 66 y.o. male with medical history significant of Down syndrome, mental retardation, morbid obesity, hypothyroidism, PVD, tobacco abuse, CK-3, chronic venous insufficiency in legs, who presents with fall, right shoulder pain, abdominal pain and distention. s/p R reverse TSA and R ureteral stent placement and stone removal 06/19/2017     PT Comments    Performed BLE exercises. Transfer with mechanical lift not attempted.  (Pt sat EOB with OT this morning and continues to have heavy R lateral lean. Due to this lean, nursing felt pt would not be safe in  recliner as he may lean and fall out. He physically resisted mechanical lift with nurse techs earlier today.)    Follow Up Recommendations  SNF;Supervision/Assistance - 24 hour     Equipment Recommendations  Wheelchair (measurements PT);Wheelchair cushion (measurements PT)    Recommendations for Other Services       Precautions / Restrictions Precautions Precautions: Shoulder Type of Shoulder Precautions: conservative  Shoulder Interventions: Shoulder sling/immobilizer;Off for dressing/bathing/exercises;At all times Precaution Comments: No PROM and AROM; ROM elbow, wrist, hand.  sling at all times except for ADLs and exercise  Required Braces or Orthoses: Sling Restrictions Weight Bearing Restrictions: Yes RUE Weight Bearing: Non weight bearing    Mobility  Bed Mobility Overal bed mobility: Needs Assistance Bed Mobility: Supine to Sit;Sit to Supine     Supine to sit: +2 for physical assistance;+2 for safety/equipment;HOB elevated;Max assist Sit to supine: +2 for physical assistance;+2 for safety/equipment;Max assist   General bed mobility comments: pt stated he was too tired to sit up (sat up on EOB with OT earlier today)  Transfers                 General transfer  comment: NT- considered transfer with mechanical lift however nurse techs stated they'd used one to lift pt for pericare and he physically resisted the lift, with poor sitting balance and heavy R lean, concern that he would fall out of a recliner  Ambulation/Gait                 Stairs            Wheelchair Mobility    Modified Rankin (Stroke Patients Only)       Balance Overall balance assessment: Needs assistance Sitting-balance support: Single extremity supported;Feet supported Sitting balance-Leahy Scale: Fair   Postural control: Right lateral lean (initially)                                  Cognition Arousal/Alertness: Awake/alert Behavior During Therapy: WFL for tasks assessed/performed Overall Cognitive Status: No family/caregiver present to determine baseline cognitive functioning                                        Exercises General Exercises - Lower Extremity Ankle Circles/Pumps: AAROM;Both;10 reps;Supine Short Arc Quad: AAROM;AROM;Both;10 reps;Supine Heel Slides: AAROM;Both;10 reps;Supine Hip ABduction/ADduction: AAROM;Both;10 reps;Supine Shoulder Exercises Elbow Flexion: PROM;Right;10 reps;Supine Elbow Extension: PROM;Right;10 reps;Supine Wrist Flexion: PROM;Right;10 reps;Supine Wrist Extension: PROM;Right;10 reps;Supine Digit Composite Flexion: PROM;AROM;Right;10 reps;Supine Composite Extension: PROM;AROM;Right;Supine;10 reps    General Comments        Pertinent Vitals/Pain Faces Pain Scale: No hurt    Home Living  Prior Function            PT Goals (current goals can now be found in the care plan section) Acute Rehab PT Goals Patient Stated Goal: didn't state  PT Goal Formulation: With patient Time For Goal Achievement: 07/10/17 Potential to Achieve Goals: Fair Progress towards PT goals: Not progressing toward goals - comment    Frequency    Min 3X/week       PT Plan Current plan remains appropriate    Co-evaluation              AM-PAC PT "6 Clicks" Daily Activity  Outcome Measure  Difficulty turning over in bed (including adjusting bedclothes, sheets and blankets)?: Unable Difficulty moving from lying on back to sitting on the side of the bed? : Unable Difficulty sitting down on and standing up from a chair with arms (e.g., wheelchair, bedside commode, etc,.)?: Unable Help needed moving to and from a bed to chair (including a wheelchair)?: Total Help needed walking in hospital room?: Total Help needed climbing 3-5 steps with a railing? : Total 6 Click Score: 6    End of Session   Activity Tolerance: Patient tolerated treatment well Patient left: in bed;with call bell/phone within reach;with bed alarm set Nurse Communication: Mobility status PT Visit Diagnosis: History of falling (Z91.81);Difficulty in walking, not elsewhere classified (R26.2);Dizziness and giddiness (R42)     Time: 1655-3748 PT Time Calculation (min) (ACUTE ONLY): 12 min  Charges:  $Therapeutic Exercise: 8-22 mins                    G Codes:          Philomena Doheny 06/28/2017, 3:30 PM [phone]

## 2017-06-28 NOTE — Progress Notes (Signed)
   Subjective: 3 Days Post-Op Procedure(s) (LRB): reverse right SHOULDER ARTHROPLASTY (Right) CYSTOSCOPY WITH RETROGRADE PYELOGRAM, URETEROSCOPY AND STENT PLACEMENT basket extraction stone (Right)  Pt resting comfortably in no acute distress Mild pain/soreness in the right shoulder Denies any new symptoms or issues Patient reports pain as mild.  Objective:   VITALS:   Vitals:   06/27/17 2137 06/28/17 0553  BP: (!) 142/76 (!) 144/89  Pulse: (!) 55 (!) 52  Resp: 18 18  Temp: 97.9 F (36.6 C) 98 F (36.7 C)  SpO2: 93% 93%    Right shoulder incision healing well nv intact distally No rashes or edema  Sling in place  LABS  Recent Labs  06/26/17 0523 06/27/17 0349  HGB 11.7* 10.9*  HCT 34.8* 33.3*  WBC 15.2* 16.0*  PLT 180 199     Recent Labs  06/26/17 0523 06/27/17 0349  NA 139 140  K 4.0 3.6  BUN 38* 48*  CREATININE 2.16* 1.84*  GLUCOSE 169* 158*     Assessment/Plan: 3 Days Post-Op Procedure(s) (LRB): reverse right SHOULDER ARTHROPLASTY (Right) CYSTOSCOPY WITH RETROGRADE PYELOGRAM, URETEROSCOPY AND STENT PLACEMENT basket extraction stone (Right) Overall doing fairly well Clear for d/c from orthopedic standpoint F/u in 2 weeks after d/c     Merla Riches, MPAS, PA-C  06/28/2017, 7:58 AM

## 2017-06-28 NOTE — Progress Notes (Signed)
PROGRESS NOTE [facility]   Eduardo Herman   DGU:[id number] DOB: 1951/07/09  DOA: 06/17/2017 PCP: Leonard Downing, MD   Brief Narrative:  Eduardo Herman  is a 66 year old male with medical history significant of Down syndrome, MR, hypothyroidism, chronic kidney disease stage III chronic venous insufficiency who presented to the emergency department after mechanical fall and right shoulder pain. Patient was found to have R shoulder fracture orthopedic surgery was consulted. CT of the abdomen was done due to abdominal pain she revealed a 5 mm kidney stone with possible pyelonephritis. Patient was started on IV abx, flomax and urology was consulted. Patient now is status post right shoulder arthroplasty and cystoscopy with right thorascopic with stent placement. Pending placement to SNF.  Subjective: Patient complaining today of cough. Not eating much during the day. No other complaints. Pending placement  Assessment & Plan: Fracture of right humerus - status post mechanical fall Status post right shoulder arthroplasty on 06/10/2017 Orthopedic surgery cleared for discharge  Pain medication as needed Continue PT/OT as tolerated   Right ureteral stone with possible pyelonephritis Status post cystoscopy with right ureteroscopy and stent placement Continue antibiotics, urine culture grew Proteus mirabilis pansensitive  Initially treated with Rocephin, this was discussed related to Ancef on 8/27 Foley was removed  WBC increasing unclear etiology at this point. Given new cough chest x-ra, patient has not been compliant with incentive spirometry Continue Flomax  Acute kidney injury on Chronic kidney disease stage III - creatinine improving Baseline creatinine on 2016 was 1.79, although patient on admission was found to have creatinine of 2.13 creatinine has responded to IV fluids which meets criteria for acute renal failure. Creatinine at baseline Avoid nephrotoxic  agent  Hypokalemia - resolved  Hypertension Blood pressure continues to be stable during hospital stay Continue atenolol and Cardizem Holding Lasix for now given acute renal failure  Leukocytosis continues to increase unclear reason  Likely reactive to surgical procedure Patient with cough, will obtain x-ray. Will get blood cultures.  Monitor in cbc in AM, if wbc continues to increase may need broaden abx coverage   Down syndrome with MR Stable, father legal guardian  DVT prophylaxis: SCDs Code Status: Full code Family Communication: None at bedside Disposition Plan: Likely SNF when cleared by urology and orthopedic surgery  Consultants:   Orthopedic surgery  Urology  Procedures:     Antimicrobials: Anti-infectives    Start     Dose/Rate Route Frequency Ordered Stop   06/28/17 1300  ceFAZolin (ANCEF) IVPB 1 g/50 mL premix     1 g 100 mL/hr over 30 Minutes Intravenous Every 8 hours 06/27/17 1858     06/04/2017 2000  ceFAZolin (ANCEF) IVPB 2g/100 mL premix  Status:  Discontinued     2 g 200 mL/hr over 30 Minutes Intravenous Every 6 hours 06/09/2017 1824 06/19/2017 1835   06/20/2017 1316  ceFAZolin (ANCEF) 3 g in dextrose 5 % 50 mL IVPB     3 g 130 mL/hr over 30 Minutes Intravenous 30 min pre-op 06/07/2017 1316 06/05/2017 1359   06/13/2017 1400  cefTRIAXone (ROCEPHIN) 1 g in dextrose 5 % 50 mL IVPB  Status:  Discontinued     1 g 100 mL/hr over 30 Minutes Intravenous Every 24 hours 06/03/2017 1334 06/27/17 1838       Objective: Vitals:   06/27/17 1427 06/27/17 2137 06/28/17 0553 06/28/17 1432  BP: 126/64 (!) 142/76 (!) 144/89 (!) 148/78  Pulse: 66 (!) 55 (!) 52 65  Resp: 18  18 18 18   Temp: 98 F (36.7 C) 97.9 F (36.6 C) 98 F (36.7 C) 97.6 F (36.4 C)  TempSrc: Oral Oral Oral Oral  SpO2: 96% 93% 93% 93%  Weight:      Height:        Intake/Output Summary (Last 24 hours) at 06/28/17 1738 Last data filed at 06/28/17 1400  Gross per 24 hour  Intake              770 ml   Output                0 ml  Net              770 ml   Filed Weights   06/22/2017 0110  Weight: 134.9 kg (297 lb 6.4 oz)    Examination:  General: NAD Cardiovascular: RRR, S1/S2 +, no rubs, no gallops Respiratory: CTA bilaterally, no wheezing, no rhonchi Abdominal: Obese, Soft, NT, ND, bowel sounds + Extremities: R arm on sling   Data Reviewed: I have personally reviewed following labs and imaging studies  CBC:  Recent Labs Lab 06/23/2017 0352 06/26/2017 1508 06/17/2017 0505 06/26/17 0523 06/27/17 0349 06/28/17 1108  WBC 17.2* 12.9* 10.5 15.2* 16.0* 18.4*  NEUTROABS 13.9*  --  7.9* 13.6* 13.3*  --   HGB 15.1 14.2 13.2 11.7* 10.9* 13.7  HCT 44.8 42.3 39.9 34.8* 33.3* 40.9  MCV 88.0 87.9 88.1 87.2 88.6 87.0  PLT 192 162 175 180 199 546   Basic Metabolic Panel:  Recent Labs Lab 06/03/2017 1508 06/22/2017 0505 06/26/17 0523 06/27/17 0349 06/28/17 1108  NA 140 140 139 140 141  K 3.2* 3.3* 4.0 3.6 3.1*  CL 109 109 109 112* 109  CO2 23 25 23  20* 21*  GLUCOSE 157* 131* 169* 158* 167*  BUN 23* 26* 38* 48* 41*  CREATININE 1.96* 1.69* 2.16* 1.84* 1.53*  CALCIUM 8.0* 7.9* 7.7* 7.6* 8.2*  PHOS  --   --   --  2.9  --    GFR: Estimated Creatinine Clearance: 63 mL/min (A) (by C-G formula based on SCr of 1.53 mg/dL (H)). Liver Function Tests:  Recent Labs Lab 06/27/17 0349  ALBUMIN 2.3*    Recent Labs Lab 07/01/2017 0945  LIPASE 18   No results for input(s): AMMONIA in the last 168 hours. Coagulation Profile:  Recent Labs Lab 06/16/2017 0945  INR 1.36   Cardiac Enzymes:  Recent Labs Lab 06/07/2017 0353 06/21/2017 0945  CKTOTAL  --  85  TROPONINI <0.03  --    BNP (last 3 results) No results for input(s): PROBNP in the last 8760 hours. HbA1C: No results for input(s): HGBA1C in the last 72 hours. CBG: No results for input(s): GLUCAP in the last 168 hours. Lipid Profile: No results for input(s): CHOL, HDL, LDLCALC, TRIG, CHOLHDL, LDLDIRECT in the last 72  hours. Thyroid Function Tests: No results for input(s): TSH, T4TOTAL, FREET4, T3FREE, THYROIDAB in the last 72 hours. Anemia Panel: No results for input(s): VITAMINB12, FOLATE, FERRITIN, TIBC, IRON, RETICCTPCT in the last 72 hours. Sepsis Labs: No results for input(s): PROCALCITON, LATICACIDVEN in the last 168 hours.  Recent Results (from the past 240 hour(s))  Culture, Urine     Status: Abnormal   Collection Time: 06/23/2017  3:40 AM  Result Value Ref Range Status   Specimen Description URINE, RANDOM  Final   Special Requests NONE  Final   Culture 50,000 COLONIES/mL PROTEUS MIRABILIS (A)  Final   Report Status 06/27/2017 FINAL  Final   Organism ID, Bacteria PROTEUS MIRABILIS (A)  Final      Susceptibility   Proteus mirabilis - MIC*    AMPICILLIN <=2 SENSITIVE Sensitive     CEFAZOLIN <=4 SENSITIVE Sensitive     CEFTRIAXONE <=1 SENSITIVE Sensitive     CIPROFLOXACIN <=0.25 SENSITIVE Sensitive     GENTAMICIN <=1 SENSITIVE Sensitive     IMIPENEM 0.5 SENSITIVE Sensitive     NITROFURANTOIN >=512 RESISTANT Resistant     TRIMETH/SULFA <=20 SENSITIVE Sensitive     AMPICILLIN/SULBACTAM <=2 SENSITIVE Sensitive     PIP/TAZO <=4 SENSITIVE Sensitive     * 50,000 COLONIES/mL PROTEUS MIRABILIS  Surgical pcr screen     Status: None   Collection Time: 06/26/2017  8:44 PM  Result Value Ref Range Status   MRSA, PCR NEGATIVE NEGATIVE Final   Staphylococcus aureus NEGATIVE NEGATIVE Final    Comment:        The Xpert SA Assay (FDA approved for NASAL specimens in patients over 49 years of age), is one component of a comprehensive surveillance program.  Test performance has been validated by [facility] for patients greater than or equal to 87 year old. It is not intended to diagnose infection nor to guide or monitor treatment.   Culture, Urine     Status: Abnormal   Collection Time: 06/28/2017 12:36 AM  Result Value Ref Range Status   Specimen Description URINE, CATHETERIZED  Final   Special  Requests NONE  Final   Culture (A)  Final    <10,000 COLONIES/mL Performed at [facility], [place] [address], [place], [place] [address]    Report Status 06/26/2017 FINAL  Final      Radiology Studies: No results found.    Scheduled Meds: . atenolol  100 mg Oral Daily  . diltiazem  360 mg Oral Daily  . docusate sodium  100 mg Oral BID  . levothyroxine  225 mcg Oral QAC breakfast  . nicotine  21 mg Transdermal Daily  . omega-3 acid ethyl esters  1 g Oral Daily  . polyethylene glycol  17 g Oral Daily  . potassium chloride  20 mEq Oral Once  . potassium chloride SA  20 mEq Oral Daily  . tamsulosin  0.4 mg Oral Daily   Continuous Infusions: .  ceFAZolin (ANCEF) IV Stopped (06/28/17 1407)  . methocarbamol (ROBAXIN)  IV    . potassium chloride       LOS: 4 days    Time spent: Total of 15 minutes spent with pt, greater than 50% of which was spent in discussion of  treatment, counseling and coordination of care   Chipper Oman, MD Pager: Text Page via [email or web address]   If 7PM-7AM, please contact night-coverage [email or web address] 06/28/2017, 5:38 PM

## 2017-06-29 ENCOUNTER — Encounter (HOSPITAL_COMMUNITY): Payer: Self-pay | Admitting: Radiology

## 2017-06-29 ENCOUNTER — Inpatient Hospital Stay (HOSPITAL_COMMUNITY): Payer: Medicare Other

## 2017-06-29 DIAGNOSIS — R1084 Generalized abdominal pain: Secondary | ICD-10-CM

## 2017-06-29 DIAGNOSIS — R0902 Hypoxemia: Secondary | ICD-10-CM

## 2017-06-29 LAB — BLOOD GAS, ARTERIAL
Acid-base deficit: 2.2 mmol/L — ABNORMAL HIGH (ref 0.0–2.0)
Bicarbonate: 20.8 mmol/L (ref 20.0–28.0)
Drawn by: 232811
O2 CONTENT: 6 L/min
O2 SAT: 87.9 %
PATIENT TEMPERATURE: 98.9
PO2 ART: 57.3 mmHg — AB (ref 83.0–108.0)
pCO2 arterial: 32.4 mmHg (ref 32.0–48.0)
pH, Arterial: 7.424 (ref 7.350–7.450)

## 2017-06-29 LAB — CBC WITH DIFFERENTIAL/PLATELET
Basophils Absolute: 0.2 10*3/uL — ABNORMAL HIGH (ref 0.0–0.1)
Basophils Relative: 1 %
Eosinophils Absolute: 0 10*3/uL (ref 0.0–0.7)
Eosinophils Relative: 0 %
HCT: 39.7 % (ref 39.0–52.0)
Hemoglobin: 13 g/dL (ref 13.0–17.0)
Lymphocytes Relative: 5 %
Lymphs Abs: 0.8 10*3/uL (ref 0.7–4.0)
MCH: 28.4 pg (ref 26.0–34.0)
MCHC: 32.7 g/dL (ref 30.0–36.0)
MCV: 86.9 fL (ref 78.0–100.0)
Monocytes Absolute: 1.7 10*3/uL — ABNORMAL HIGH (ref 0.1–1.0)
Monocytes Relative: 11 %
Neutro Abs: 12.4 10*3/uL — ABNORMAL HIGH (ref 1.7–7.7)
Neutrophils Relative %: 83 %
Platelets: 260 10*3/uL (ref 150–400)
RBC: 4.57 MIL/uL (ref 4.22–5.81)
RDW: 16 % — ABNORMAL HIGH (ref 11.5–15.5)
WBC: 15.1 10*3/uL — ABNORMAL HIGH (ref 4.0–10.5)

## 2017-06-29 LAB — BASIC METABOLIC PANEL
Anion gap: 9 (ref 5–15)
BUN: 48 mg/dL — ABNORMAL HIGH (ref 6–20)
CO2: 23 mmol/L (ref 22–32)
Calcium: 8.3 mg/dL — ABNORMAL LOW (ref 8.9–10.3)
Chloride: 113 mmol/L — ABNORMAL HIGH (ref 101–111)
Creatinine, Ser: 1.53 mg/dL — ABNORMAL HIGH (ref 0.61–1.24)
GFR calc Af Amer: 53 mL/min — ABNORMAL LOW (ref >60)
GFR calc non Af Amer: 45 mL/min — ABNORMAL LOW (ref >60)
Glucose, Bld: 154 mg/dL — ABNORMAL HIGH (ref 65–99)
Potassium: 3.6 mmol/L (ref 3.5–5.1)
Sodium: 145 mmol/L (ref 135–145)

## 2017-06-29 LAB — OCCULT BLOOD GASTRIC / DUODENUM (SPECIMEN CUP): Occult Blood, Gastric: POSITIVE — AB

## 2017-06-29 LAB — MAGNESIUM: Magnesium: 2.3 mg/dL (ref 1.7–2.4)

## 2017-06-29 LAB — MRSA PCR SCREENING: MRSA by PCR: NEGATIVE

## 2017-06-29 MED ORDER — LORAZEPAM 2 MG/ML IJ SOLN
INTRAMUSCULAR | Status: AC
  Filled 2017-06-29: qty 1

## 2017-06-29 MED ORDER — LEVOTHYROXINE SODIUM 100 MCG IV SOLR
112.0000 ug | Freq: Every day | INTRAVENOUS | Status: DC
  Administered 2017-06-29: 112 ug via INTRAVENOUS
  Administered 2017-06-30: 100 ug via INTRAVENOUS
  Administered 2017-07-01 – 2017-07-02 (×2): 112 ug via INTRAVENOUS
  Filled 2017-06-29 (×4): qty 10

## 2017-06-29 MED ORDER — LORAZEPAM 2 MG/ML IJ SOLN
1.0000 mg | Freq: Once | INTRAMUSCULAR | Status: AC
  Administered 2017-06-29: 1 mg via INTRAVENOUS
  Filled 2017-06-29: qty 1

## 2017-06-29 MED ORDER — IOPAMIDOL (ISOVUE-300) INJECTION 61%
100.0000 mL | Freq: Once | INTRAVENOUS | Status: AC | PRN
  Administered 2017-06-29: 100 mL via INTRAVENOUS

## 2017-06-29 MED ORDER — IOPAMIDOL (ISOVUE-300) INJECTION 61%: 15.0000 mL | Freq: Once | INTRAVENOUS | Status: DC | PRN

## 2017-06-29 MED ORDER — IOPAMIDOL (ISOVUE-300) INJECTION 61%
INTRAVENOUS | Status: AC
  Filled 2017-06-29: qty 30

## 2017-06-29 MED ORDER — IOPAMIDOL (ISOVUE-300) INJECTION 61%
INTRAVENOUS | Status: AC
  Filled 2017-06-29: qty 100

## 2017-06-29 MED ORDER — PIPERACILLIN-TAZOBACTAM 3.375 G IVPB
3.3750 g | Freq: Three times a day (TID) | INTRAVENOUS | Status: DC
  Administered 2017-06-29 – 2017-07-02 (×10): 3.375 g via INTRAVENOUS
  Filled 2017-06-29 (×8): qty 50

## 2017-06-29 MED ORDER — METOPROLOL TARTRATE 5 MG/5ML IV SOLN
5.0000 mg | Freq: Four times a day (QID) | INTRAVENOUS | Status: DC
  Administered 2017-06-29 – 2017-07-02 (×11): 5 mg via INTRAVENOUS
  Filled 2017-06-29 (×12): qty 5

## 2017-06-29 NOTE — Progress Notes (Signed)
Pharmacy Antibiotic Note  Eduardo Herman is a 66 y.o. male admitted on 06/10/2017 with Aspiration event.  Pharmacy has been consulted for zosyn dosing.  Plan: Zosyn 3.375g IV q8h (4 hour infusion).  Height: 5\' 8"  (172.7 cm) Weight: 297 lb 6.4 oz (134.9 kg) IBW/kg (Calculated) : 68.4  Temp (24hrs), Avg:98.2 F (36.8 C), Min:97.6 F (36.4 C), Max:98.9 F (37.2 C)   Recent Labs Lab 06/11/2017 1508 06/01/2017 0505 06/26/17 0523 06/27/17 0349 06/28/17 1108  WBC 12.9* 10.5 15.2* 16.0* 18.4*  CREATININE 1.96* 1.69* 2.16* 1.84* 1.53*    Estimated Creatinine Clearance: 63 mL/min (A) (by C-G formula based on SCr of 1.53 mg/dL (H)).    Allergies  Allergen Reactions  . Lisinopril Swelling    Angioedema of the tongue    Antimicrobials this admission: Cefazolin 8/27 >> 8/29 Ceftriaxone 8/24 >> 8/27 Zosyn 8/29 >>  Dose adjustments this admission: -    Thank you for allowing pharmacy to be a part of this patient's care.  Eduardo Herman 06/29/2017 4:01 AM

## 2017-06-29 NOTE — Progress Notes (Signed)
PASSR # [id number] E received. Updated [facility] (pt has selected for rehab at [facility]). Will continue following.  Sharren Bridge, MSW, LCSW Clinical Social Work 06/29/2017 [phone]

## 2017-06-29 NOTE — Progress Notes (Signed)
Pt noted by NT to have increased resp and "sounding stranger than previously." Resp Therapist contacted to meet writer in room. Pt noted to have had large amount of dark brown liquid emesis on bedding and clothing. Pt continues to have moderate amount of congestion noted in back of throat with poor cough effort. Attempted to suctions with [place], unsuccessful at reaching audible secretions noted. Pt's abdomen feels firmer and more distended than previous assessment, but pt expels flatus when turned from side to side. And expels loose liquid greenish brown stool. Call placed to On Call covering to update on status of patient. Order received to perform Acute Abdominal Films, at this time, and prescriber would be coming in to assess pt. Notified "Rapid Response Team due to change in patient status. After completion of abdominal xrays and "real time viewing fo films by K. Schorr, NP, NG tube placed in patients right nare by Gilmer Mor, RN on Rapid Response Team, placement verified by auscultation and aspiration of liquid green secretions, Specimen placed on Hemmoccult card and sent to lab. Immediate return of a total of 1700 cc's of fluid removed from stomach via LWS, pt resting more comfortably at this time. 0200 Call placed to pt's father who is POA and notified of patients status and reassignment of room.

## 2017-06-29 NOTE — Progress Notes (Signed)
Around 1730 pt became agitated and pulled NRB mask off as well as pulled NG tube out. Attempted to place another and he was hitting and yelling out loudly. Will try again later.

## 2017-06-29 NOTE — Progress Notes (Signed)
PROGRESS NOTE    Eduardo Herman  OEU:[id number] DOB: [birth year] DOA: 06/06/2017 PCP: Leonard Downing, MD    Brief Narrative:  66 year old male with medical history significant of Down syndrome, MR, hypothyroidism, chronic kidney disease stage III chronic venous insufficiency who presented to the emergency department after mechanical fall and right shoulder pain. Patient was found to have R shoulder fracture orthopedic surgery was consulted. CT of the abdomen was done due to abdominal pain she revealed a 5 mm kidney stone with possible pyelonephritis. Patient was started on IV abx, flomax and urology was consulted. Patient now is status post right shoulder arthroplasty and cystoscopy with right thorascopic with stent placement.   Assessment & Plan:   Principal Problem:   Fall Active Problems:   Mental retardation   Essential hypertension, benign   CKD (chronic kidney disease), stage III   Hypokalemia   Hypothyroidism   Tobacco abuse   Fracture of humerus anatomical neck, right, closed, initial encounter   Abdominal distension   Abdominal pain   S/P shoulder replacement, right   Hypoxia  Fracture of right humerus Pt is status post mechanical fall Status post right shoulder arthroplasty on 06/19/2017 Orthopedic surgery has cleared for discharge  Pain medication continued as needed Patient to continue PT/OT as tolerated   Right ureteral stone with possible pyelonephritis Status post cystoscopy with right ureteroscopy and stent placement Continue antibiotics, urine culture grew Proteus mirabilis pansensitive  Initially treated with Rocephin, this was discussed related to Ancef on 8/27 Foley was removed  WBC increasing unclear etiology at this point. Given new cough chest x-ra, patient has not been compliant with incentive spirometry Continued on Flomax as tolerated  Acute kidney injury on Chronic kidney disease stage III - creatinine improving Baseline creatinine on 2016  was 1.79, although patient on admission was found to have creatinine of 2.13 creatinine has responded to IV fluids which meets criteria for acute renal failure. Creatinine remains at baseline Repeat basic metabolic panel morning  Hypokalemia - resolved Repeat basic metabolic panel in the morning  Hypertension Blood pressure continues to be stable during hospital stay Initially continued on atenolol and Cardizem Given overnight events, patient currently on nonrebreather. We'll transition atenolol to IV beta blocker with hold parameters. Cardizem on hold until patient able to reliably tolerate by mouth.  Leukocytosis continues to increase unclear reason  Likely reactive to surgical procedure.  WBC trending down, labs reviewed  Down syndrome with MR Stable, father legal guardian  Ileus Overnight events noted. Patient noted to have increase abdominal distention with nausea, vomiting, concerns for likely aspiration.  Aspiration Pneumonia Overnight events noted. Patient with episode of vomiting, increased O2 requirements, imaging studies suggesting likely aspiration. Patient started on Zosyn. Patient remains on nonrebreather. Continue to wean O2 as tolerated.  DVT prophylaxis:  SCDs Code Status:  Full code Family Communication:  Patient in room, family not at bedside Disposition Plan:  Uncertain at this time  Consultants:   Orthopedic surgery  urology  Procedures:     Antimicrobials: Anti-infectives    Start     Dose/Rate Route Frequency Ordered Stop   06/29/17 0415  piperacillin-tazobactam (ZOSYN) IVPB 3.375 g     3.375 g 12.5 mL/hr over 240 Minutes Intravenous Every 8 hours 06/29/17 0400     06/28/17 1300  ceFAZolin (ANCEF) IVPB 1 g/50 mL premix  Status:  Discontinued     1 g 100 mL/hr over 30 Minutes Intravenous Every 8 hours 06/27/17 1858 06/29/17 0400   06/03/2017  2000  ceFAZolin (ANCEF) IVPB 2g/100 mL premix  Status:  Discontinued     2 g 200 mL/hr over 30  Minutes Intravenous Every 6 hours 06/06/2017 1824 06/05/2017 1835   06/24/2017 1316  ceFAZolin (ANCEF) 3 g in dextrose 5 % 50 mL IVPB     3 g 130 mL/hr over 30 Minutes Intravenous 30 min pre-op 06/14/2017 1316 06/14/2017 1359   06/06/2017 1400  cefTRIAXone (ROCEPHIN) 1 g in dextrose 5 % 50 mL IVPB  Status:  Discontinued     1 g 100 mL/hr over 30 Minutes Intravenous Every 24 hours 06/06/2017 1334 06/27/17 1838       Subjective: Continue shortness of breath  Objective: Vitals:   06/29/17 0900 06/29/17 1000 06/29/17 1100 06/29/17 1200  BP:  (!) 145/62 140/74   Pulse: 64 63 62   Resp: (!) 30 (!) 32 (!) 30   Temp:    98.4 F (36.9 C)  TempSrc:    Axillary  SpO2: 94% 90% 93%   Weight:      Height:        Intake/Output Summary (Last 24 hours) at 06/29/17 1552 Last data filed at 06/28/17 1833  Gross per 24 hour  Intake              220 ml  Output                0 ml  Net              220 ml   Filed Weights   06/14/2017 0110 06/29/17 0355  Weight: 134.9 kg (297 lb 6.4 oz) 133.6 kg (294 lb 8.6 oz)    Examination:  General exam: Awake, lying in bed, nonrebreather in place Respiratory system:  Mildly increased respiratory effort, no audible wheezing Cardiovascular system: S1 & S2 heard, RRR. No JVD, murmurs, rubs, gallops or clicks. No pedal edema. Gastrointestinal system:  Decreased bowel sounds, mildly distended Central nervous system: Alert and oriented. No focal neurological deficits. Extremities: Symmetric 5 x 5 power. Skin: No rashes, lesions Psychiatry: Judgement and insight appear normal. Mood & affect appropriate.   Data Reviewed: I have personally reviewed following labs and imaging studies  CBC:  Recent Labs Lab 06/20/2017 0352  06/24/2017 0505 06/26/17 0523 06/27/17 0349 06/28/17 1108 06/29/17 0559  WBC 17.2*  < > 10.5 15.2* 16.0* 18.4* 15.1*  NEUTROABS 13.9*  --  7.9* 13.6* 13.3*  --  12.4*  HGB 15.1  < > 13.2 11.7* 10.9* 13.7 13.0  HCT 44.8  < > 39.9 34.8* 33.3* 40.9  39.7  MCV 88.0  < > 88.1 87.2 88.6 87.0 86.9  PLT 192  < > 175 180 199 271 260  < > = values in this interval not displayed. Basic Metabolic Panel:  Recent Labs Lab 06/22/2017 0505 06/26/17 0523 06/27/17 0349 06/28/17 1108 06/29/17 0559  NA 140 139 140 141 145  K 3.3* 4.0 3.6 3.1* 3.6  CL 109 109 112* 109 113*  CO2 25 23 20* 21* 23  GLUCOSE 131* 169* 158* 167* 154*  BUN 26* 38* 48* 41* 48*  CREATININE 1.69* 2.16* 1.84* 1.53* 1.53*  CALCIUM 7.9* 7.7* 7.6* 8.2* 8.3*  MG  --   --   --   --  2.3  PHOS  --   --  2.9  --   --    GFR: Estimated Creatinine Clearance: 62.6 mL/min (A) (by C-G formula based on SCr of 1.53 mg/dL (H)). Liver Function Tests:  Recent Labs Lab 06/27/17 0349  ALBUMIN 2.3*    Recent Labs Lab 06/05/2017 0945  LIPASE 18   No results for input(s): AMMONIA in the last 168 hours. Coagulation Profile:  Recent Labs Lab 06/02/2017 0945  INR 1.36   Cardiac Enzymes:  Recent Labs Lab 06/13/2017 0353 06/28/2017 0945  CKTOTAL  --  85  TROPONINI <0.03  --    BNP (last 3 results) No results for input(s): PROBNP in the last 8760 hours. HbA1C: No results for input(s): HGBA1C in the last 72 hours. CBG: No results for input(s): GLUCAP in the last 168 hours. Lipid Profile: No results for input(s): CHOL, HDL, LDLCALC, TRIG, CHOLHDL, LDLDIRECT in the last 72 hours. Thyroid Function Tests: No results for input(s): TSH, T4TOTAL, FREET4, T3FREE, THYROIDAB in the last 72 hours. Anemia Panel: No results for input(s): VITAMINB12, FOLATE, FERRITIN, TIBC, IRON, RETICCTPCT in the last 72 hours. Sepsis Labs: No results for input(s): PROCALCITON, LATICACIDVEN in the last 168 hours.  Recent Results (from the past 240 hour(s))  Culture, Urine     Status: Abnormal   Collection Time: 06/07/2017  3:40 AM  Result Value Ref Range Status   Specimen Description URINE, RANDOM  Final   Special Requests NONE  Final   Culture 50,000 COLONIES/mL PROTEUS MIRABILIS (A)  Final    Report Status 06/27/2017 FINAL  Final   Organism ID, Bacteria PROTEUS MIRABILIS (A)  Final      Susceptibility   Proteus mirabilis - MIC*    AMPICILLIN <=2 SENSITIVE Sensitive     CEFAZOLIN <=4 SENSITIVE Sensitive     CEFTRIAXONE <=1 SENSITIVE Sensitive     CIPROFLOXACIN <=0.25 SENSITIVE Sensitive     GENTAMICIN <=1 SENSITIVE Sensitive     IMIPENEM 0.5 SENSITIVE Sensitive     NITROFURANTOIN >=512 RESISTANT Resistant     TRIMETH/SULFA <=20 SENSITIVE Sensitive     AMPICILLIN/SULBACTAM <=2 SENSITIVE Sensitive     PIP/TAZO <=4 SENSITIVE Sensitive     * 50,000 COLONIES/mL PROTEUS MIRABILIS  Surgical pcr screen     Status: None   Collection Time: 06/23/2017  8:44 PM  Result Value Ref Range Status   MRSA, PCR NEGATIVE NEGATIVE Final   Staphylococcus aureus NEGATIVE NEGATIVE Final    Comment:        The Xpert SA Assay (FDA approved for NASAL specimens in patients over 31 years of age), is one component of a comprehensive surveillance program.  Test performance has been validated by [facility] for patients greater than or equal to 69 year old. It is not intended to diagnose infection nor to guide or monitor treatment.   Culture, Urine     Status: Abnormal   Collection Time: 06/13/2017 12:36 AM  Result Value Ref Range Status   Specimen Description URINE, CATHETERIZED  Final   Special Requests NONE  Final   Culture (A)  Final    <10,000 COLONIES/mL Performed at [facility], [place] [address], [place],  [address]    Report Status 06/26/2017 FINAL  Final  Culture, blood (Routine X 2) w Reflex to ID Panel     Status: None (Preliminary result)   Collection Time: 06/28/17  5:07 PM  Result Value Ref Range Status   Specimen Description BLOOD LEFT ANTECUBITAL  Final   Special Requests IN PEDIATRIC BOTTLE Blood Culture adequate volume  Final   Culture   Final    NO GROWTH < 24 HOURS Performed at [facility], [place] [address], [place], [place]  [address]    Report Status  PENDING  Incomplete  Culture, blood (Routine X 2) w Reflex to ID Panel     Status: None (Preliminary result)   Collection Time: 06/28/17  5:21 PM  Result Value Ref Range Status   Specimen Description BLOOD LEFT HAND  Final   Special Requests   Final    BOTTLES DRAWN AEROBIC ONLY Blood Culture adequate volume   Culture   Final    NO GROWTH < 24 HOURS Performed at [facility], [place] [address] [place], [place] [address]    Report Status PENDING  Incomplete  MRSA PCR Screening     Status: None   Collection Time: 06/29/17  8:00 AM  Result Value Ref Range Status   MRSA by PCR NEGATIVE NEGATIVE Final    Comment:        The GeneXpert MRSA Assay (FDA approved for NASAL specimens only), is one component of a comprehensive MRSA colonization surveillance program. It is not intended to diagnose MRSA infection nor to guide or monitor treatment for MRSA infections.      Radiology Studies: Ct Abdomen Pelvis W Contrast  Result Date: 06/29/2017 CLINICAL DATA:  Abdominal distention, pain, nausea, vomiting. EXAM: CT ABDOMEN AND PELVIS WITH CONTRAST TECHNIQUE: Multidetector CT imaging of the abdomen and pelvis was performed using the standard protocol following bolus administration of intravenous contrast. CONTRAST:  147mL ISOVUE-300 IOPAMIDOL (ISOVUE-300) INJECTION 61% COMPARISON:  Plain films 06/29/2017 CT 06/08/2017 FINDINGS: Lower chest: Areas of consolidation in the lower lobes bilaterally concerning for pneumonia. Trace left effusion. Heart is normal size. Hepatobiliary: No focal liver abnormality is seen. Status post cholecystectomy. No biliary dilatation. Pancreas: No focal abnormality or ductal dilatation. Spleen: No focal abnormality.  Normal size. Adrenals/Urinary Tract: Small cysts in the kidneys bilaterally. Mild right hydronephrosis. No visible renal or ureteral stones. Hydronephrosis has decreased slightly since prior study and the previously seen 5 mm mid right ureteral stone no  longer visualized. Inflammation around the right kidney has decreased. Adrenal glands and urinary bladder unremarkable. Stomach/Bowel: NG tube is in the stomach. Diffuse gaseous distention of bowel to the level of the sigmoid colon. There is no obstructing process. Findings likely reflect ileus. Appendix is visualized and is normal. Vascular/Lymphatic: No evidence of aneurysm or adenopathy. Aortic calcifications. Reproductive: Central prostate calcifications. Other: No free fluid or free air. Musculoskeletal: No acute bony abnormality. IMPRESSION: Diffuse gaseous distention of large and small bowel to the level of the sigmoid colon. Findings most compatible with ileus. NG tube is in the decompressed stomach. Bilateral lower lobe airspace opacities, left greater than right concerning for pneumonia. Previously seen 5 mm mid right ureteral stone no longer visualized. Mild right hydronephrosis which has improved since prior study. Decreasing perinephric stranding. Electronically Signed   By: Rolm Baptise M.D.   On: 06/29/2017 07:12   Dg Chest Port 1 View  Result Date: 06/29/2017 CLINICAL DATA:  Abdominal distention tonight. EXAM: PORTABLE CHEST 1 VIEW COMPARISON:  06/28/2017 FINDINGS: Limited study. Patchy central lung opacities bilaterally; this appears more prominent than on 07/01/2017. Cannot exclude alveolar edema or early infectious infiltrate. No effusions. No pneumothorax. IMPRESSION: Limited study. Mildly prominent central lung opacities bilaterally, appearing more prominent than on 06/20/2017. Electronically Signed   By: Andreas Newport M.D.   On: 06/29/2017 02:55   Dg Chest Port 1 View  Result Date: 06/28/2017 CLINICAL DATA:  Cough, congestion, nausea, vomiting EXAM: PORTABLE CHEST 1 VIEW COMPARISON:  06/18/2017 FINDINGS: Cardiomegaly with vascular congestion. Bilateral  perihilar and lower lobe opacities could reflect early edema or infection. No effusions or acute bony abnormality. IMPRESSION:  Cardiomegaly, vascular congestion. Bilateral lower lobe opacities could reflect edema or infection. Electronically Signed   By: Rolm Baptise M.D.   On: 06/28/2017 18:36   Dg Abd Portable 2v  Result Date: 06/29/2017 CLINICAL DATA:  Vomiting and abdominal distention tonight. EXAM: PORTABLE ABDOMEN - 2 VIEW COMPARISON:  CT 06/11/2017 FINDINGS: Prominent gaseous distention of the stomach, small and large bowel. No extraluminal gas is evident on this limited study. Cannot exclude a bowel obstruction. No conclusive urinary calculi. IMPRESSION: Limited study, with prominent gaseous distention of stomach, small bowel and colon. This could represent bowel obstruction or adynamic ileus; no visible free air. Electronically Signed   By: Andreas Newport M.D.   On: 06/29/2017 02:52    Scheduled Meds: . diltiazem  360 mg Oral Daily  . docusate sodium  100 mg Oral BID  . iopamidol      . levothyroxine  112 mcg Intravenous Daily  . metoprolol tartrate  5 mg Intravenous Q6H  . nicotine  21 mg Transdermal Daily  . omega-3 acid ethyl esters  1 g Oral Daily  . polyethylene glycol  17 g Oral Daily  . potassium chloride  20 mEq Oral Once  . potassium chloride SA  20 mEq Oral Daily  . tamsulosin  0.4 mg Oral Daily   Continuous Infusions: . methocarbamol (ROBAXIN)  IV    . piperacillin-tazobactam (ZOSYN)  IV Stopped (06/29/17 0841)     LOS: 5 days   Taria Castrillo, Orpah Melter, MD [facility] Pager [phone]  If 7PM-7AM, please contact night-coverage [email or web address] Password [email or web address] 06/29/2017, 3:52 PM

## 2017-06-29 NOTE — Care Management Note (Signed)
Case Management Note  Patient Details  Name: Eduardo Herman MRN: [id number] Date of Birth: [birth year]  Subjective/Objective:                  Gi bleed  Action/Plan: Date:  June 29, 2017  Chart reviewed for concurrent status and case management needs.  Will continue to follow patient progress.  Discharge Planning: following for needs  Expected discharge date: [id number]  Velva Harman, BSN, [place], [place]   Expected Discharge Date:   (unknown)               Expected Discharge Plan:  [place]  In-House Referral:  Clinical Social Work  Discharge planning Services  CM Consult  Post Acute Care Choice:    Choice offered to:     DME Arranged:    DME Agency:     HH Arranged:    [place] Agency:     Status of Service:  In process, will continue to follow  If discussed at Long Length of Stay Meetings, dates discussed:    Additional Comments:  Leeroy Cha, RN 06/29/2017, 9:30 AM

## 2017-06-29 NOTE — Progress Notes (Addendum)
Shift event note:  Notified by RN at approx 0130 that pt noted to have vomitied approx 250 cc dark brown emesis (that looked like stool per RN) and soon after dropped his 02 sats into the low to mid 80's. 02 delivery was increased from 3/4-6L but sats, though improved did not return to baseline. RR in the high 30's. Also of note during this initial notification was pt's worsening abd distention. Record reviewed briefly and orders placed for portable acute abd series. RR RN was paged and responded to bedside. An ABG was obtained by RR RN that revealed p02 37f 57.3 but was otherwise unremarkable. At bedside pt noted very tachpneic (RR- 36->40 @ times). BBS w/coarse rhonchi R>L. Pt is alert and able to answer simple questions at times (pt w/ h/o downs so not reliable historian).  02 sats noted in low 90's on 6L [place], did not improve w/ VM so placed on NRB and sats up to 98-100%. Abd is acutely distended, very firm w/o discernable bs. RN says pt has intermittently admitted to then denied abd pain tonight and has also had multiple small loose > watery stools. Preliminary view of abd film revealed acutely enlarged loops of bowel. Decision made to place NGT which appeared to immediately improve pt's increased WOB. Pt also immediately drained nearly 2L dark brown gastric secretions which were sent to lab for occult blood studies, though appears c/w coffee ground like secretions. Pt remains afebrile and remaining VSS. CBC, BMET and mag pending for this am.  Assessment/Plan: 1. Hypoxia: Likely acute aspiration event w/ vomiting. Follow CXR. Will start empirically on IV Zosyn. Keep on NRB for now. Titrate as pt tolerates.  2. Abd distention/vomiting: Concerning for obstruction vs ileus. Follow abd XR. Given exam and NGT outpt will obtain Ct abd/pelvis w/ cm to r/o SBO. Pt to remain NPO w/ ice chips only for now. Follow Gastrocult studies. Will transfer p to SDU for closer observation. Briefly discussed pt w/ Dr Chase Caller w/  [facility] to make him aware of pt's status.   Jeryl Columbia, NP-C [facility] Pager [phone]  CRITICAL CARE Performed by: Jeryl Columbia   Total critical care time: 90 minutes  Critical care time was exclusive of separately billable procedures and treating other patients.  Critical care was necessary to treat or prevent imminent or life-threatening deterioration.  Critical care was time spent personally by me on the following activities: development of treatment plan with patient and/or surrogate as well as nursing, discussions with consultants, evaluation of patient's response to treatment, examination of patient, obtaining history from patient or surrogate, ordering and performing treatments and interventions, ordering and review of laboratory studies, ordering and review of radiographic studies, pulse oximetry and re-evaluation of patient's condition.

## 2017-06-30 ENCOUNTER — Inpatient Hospital Stay (HOSPITAL_COMMUNITY): Payer: Medicare Other

## 2017-06-30 DIAGNOSIS — J9601 Acute respiratory failure with hypoxia: Secondary | ICD-10-CM

## 2017-06-30 DIAGNOSIS — J9600 Acute respiratory failure, unspecified whether with hypoxia or hypercapnia: Secondary | ICD-10-CM

## 2017-06-30 DIAGNOSIS — J690 Pneumonitis due to inhalation of food and vomit: Secondary | ICD-10-CM

## 2017-06-30 DIAGNOSIS — R140 Abdominal distension (gaseous): Secondary | ICD-10-CM

## 2017-06-30 LAB — BASIC METABOLIC PANEL
Anion gap: 13 (ref 5–15)
BUN: 59 mg/dL — AB (ref 6–20)
CHLORIDE: 112 mmol/L — AB (ref 101–111)
CO2: 21 mmol/L — ABNORMAL LOW (ref 22–32)
CREATININE: 2.13 mg/dL — AB (ref 0.61–1.24)
Calcium: 8.2 mg/dL — ABNORMAL LOW (ref 8.9–10.3)
GFR calc Af Amer: 35 mL/min — ABNORMAL LOW (ref >60)
GFR calc non Af Amer: 30 mL/min — ABNORMAL LOW (ref >60)
Glucose, Bld: 179 mg/dL — ABNORMAL HIGH (ref 65–99)
Potassium: 3.3 mmol/L — ABNORMAL LOW (ref 3.5–5.1)
SODIUM: 146 mmol/L — AB (ref 135–145)

## 2017-06-30 LAB — BLOOD GAS, ARTERIAL
ACID-BASE EXCESS: 1.3 mmol/L (ref 0.0–2.0)
ACID-BASE EXCESS: 3.6 mmol/L — AB (ref 0.0–2.0)
BICARBONATE: 24.2 mmol/L (ref 20.0–28.0)
BICARBONATE: 26.5 mmol/L (ref 20.0–28.0)
Drawn by: 33147
Drawn by: 331471
FIO2: 60
FIO2: 60
O2 SAT: 97.2 %
O2 Saturation: 97.3 %
PCO2 ART: 33.8 mmHg (ref 32.0–48.0)
PCO2 ART: 35.2 mmHg (ref 32.0–48.0)
PH ART: 7.467 — AB (ref 7.350–7.450)
PH ART: 7.489 — AB (ref 7.350–7.450)
PO2 ART: 96.7 mmHg (ref 83.0–108.0)
Patient temperature: 98.6
Patient temperature: 98.6
pO2, Arterial: 94.5 mmHg (ref 83.0–108.0)

## 2017-06-30 LAB — CBC
HCT: 38 % — ABNORMAL LOW (ref 39.0–52.0)
Hemoglobin: 12.7 g/dL — ABNORMAL LOW (ref 13.0–17.0)
MCH: 29.3 pg (ref 26.0–34.0)
MCHC: 33.4 g/dL (ref 30.0–36.0)
MCV: 87.8 fL (ref 78.0–100.0)
PLATELETS: 244 10*3/uL (ref 150–400)
RBC: 4.33 MIL/uL (ref 4.22–5.81)
RDW: 16.4 % — AB (ref 11.5–15.5)
WBC: 27.3 10*3/uL — AB (ref 4.0–10.5)

## 2017-06-30 MED ORDER — SODIUM CHLORIDE 0.9 % IV SOLN
INTRAVENOUS | Status: DC
  Administered 2017-06-30 (×2): via INTRAVENOUS

## 2017-06-30 MED ORDER — VANCOMYCIN HCL 10 G IV SOLR
1500.0000 mg | INTRAVENOUS | Status: DC
  Administered 2017-07-01: 1500 mg via INTRAVENOUS
  Filled 2017-06-30 (×2): qty 1500

## 2017-06-30 MED ORDER — VANCOMYCIN HCL 10 G IV SOLR
2000.0000 mg | Freq: Once | INTRAVENOUS | Status: AC
  Administered 2017-06-30: 2000 mg via INTRAVENOUS
  Filled 2017-06-30: qty 1000

## 2017-06-30 MED ORDER — POTASSIUM CHLORIDE 10 MEQ/100ML IV SOLN
10.0000 meq | INTRAVENOUS | Status: AC
  Administered 2017-06-30 (×3): 10 meq via INTRAVENOUS
  Filled 2017-06-30: qty 100

## 2017-06-30 MED ORDER — BISACODYL 10 MG RE SUPP
10.0000 mg | Freq: Every day | RECTAL | Status: DC
  Administered 2017-06-30: 10 mg via RECTAL
  Filled 2017-06-30: qty 1

## 2017-06-30 MED ORDER — POTASSIUM CHLORIDE 10 MEQ/100ML IV SOLN
INTRAVENOUS | Status: AC
  Filled 2017-06-30: qty 100

## 2017-06-30 MED ORDER — POTASSIUM CHLORIDE 10 MEQ/100ML IV SOLN
INTRAVENOUS | Status: AC
  Administered 2017-06-30: 10 meq
  Filled 2017-06-30: qty 100

## 2017-06-30 NOTE — Progress Notes (Signed)
NG tube not functioning due to set up, corrected set up and advanced NG tube now suctioning browning drainage.  Awaiting KUB.

## 2017-06-30 NOTE — Progress Notes (Signed)
PT Cancellation Note  Patient Details Name: Eduardo Herman MRN: [id number] DOB: 1950-11-30   Cancelled Treatment:     pt too lethargic to participate.  Pt has been evaluated by PT on 8/28 with rec for SNF.  Will check back later as schedule permits.    Rica Koyanagi  PTA [facility]  Acute  Rehab Pager      [phone]

## 2017-06-30 NOTE — Progress Notes (Signed)
Spoke with Bertram Millard, RN regarding patient's need for PICC.  At this time patient has IVx1, patient's respiratory status is guarded and unsure that patient will tolerate being laid flatter as needed for PICC insertion.  We will reassess in am and see if patient is more stable and place PICC at that time.  Ruby, RN states that if patient worsens through night CVL could be placed for more IV access.  Carolee Rota, RN [facility]

## 2017-06-30 NOTE — Progress Notes (Signed)
PROGRESS NOTE    Eduardo Herman  HBZ:[id number] DOB: 03-02-1951 DOA: 06/19/2017 PCP: Leonard Downing, MD    Brief Narrative:  66 year old male with medical history significant of Down syndrome, MR, hypothyroidism, chronic kidney disease stage III chronic venous insufficiency who presented to the emergency department after mechanical fall and right shoulder pain. Patient was found to have R shoulder fracture orthopedic surgery was consulted. CT of the abdomen was done due to abdominal pain she revealed a 5 mm kidney stone with possible pyelonephritis. Patient was started on IV abx, flomax and urology was consulted. Patient now is status post right shoulder arthroplasty and cystoscopy with right thorascopic with stent placement.   Assessment & Plan:   Principal Problem:   Fall Active Problems:   Mental retardation   Essential hypertension, benign   CKD (chronic kidney disease), stage III   Hypokalemia   Hypothyroidism   Tobacco abuse   Fracture of humerus anatomical neck, right, closed, initial encounter   Abdominal distension   Abdominal pain   S/P shoulder replacement, right   Hypoxia  Fracture of right humerus Pt is status post mechanical fall Status post right shoulder arthroplasty on 06/28/2017 Overall stable at this time  Right ureteral stone with possible pyelonephritis Status post cystoscopy with right ureteroscopy and stent placement Continue antibiotics, urine culture grew Proteus mirabilis pansensitive  Initially treated with Rocephin, this was discussed related to Ancef on 8/27 Foley was removed  Pt has not been compliant with incentive spirometry Continued on Flomax as tolerated  Acute kidney injury on Chronic kidney disease stage III - creatinine improving Baseline creatinine on 2016 was 1.79, although patient on admission was found to have creatinine of 2.13 creatinine has responded to IV fluids which meets criteria for acute renal failure. Cr  worsening Start IVF hydration  Hypokalemia - resolved Will replace Repeat bmet in AM  Hypertension Blood pressure continues to be stable during hospital stay Initially continued on atenolol and Cardizem Given recent events, patient remains on nonrebreather. Have transitioned atenolol to IV beta blocker with hold parameters. Cardizem on hold until patient able to reliably tolerate by mouth.  Leukocytosis secondary to Ileus and aspiration PNA WBC trending up overnight, worsening Have ordered and reviewed CXR and abd xray. Findings of worse ileus and worsened PNA Continued on zosyn  Down syndrome with MR Stable, father legal guardian  Ileus Overnight events noted. Patient noted to have increase abdominal distention with nausea, vomiting, concerns for likely aspiration. Abd appears more distended Repeat abd xray with findings of worsened ileus Consulted general surgery. Recs appreciated for bowel regimen If no significant improvement, surgery recommends possible neostigmine   Aspiration Pneumonia Recent events noted. Patient with episode of vomiting, increased O2 requirements, imaging studies suggesting likely aspiration. Patient started on Zosyn. Patient remains on nonrebreather. Increased tachypnea Have consulted PCCM. Appreciate input. Low threshold for intubation  DVT prophylaxis:  SCDs Code Status:  Full code Family Communication:  Patient in room, family not at bedside Disposition Plan:  Uncertain at this time  Consultants:   Orthopedic surgery  Urology  PCCM  General Surgery  Procedures:     Antimicrobials: Anti-infectives    Start     Dose/Rate Route Frequency Ordered Stop   07/01/17 1400  vancomycin (VANCOCIN) 1,500 mg in sodium chloride 0.9 % 500 mL IVPB     1,500 mg 250 mL/hr over 120 Minutes Intravenous Every 24 hours 06/30/17 1313     06/30/17 1315  vancomycin (VANCOCIN) 2,000 mg in sodium  chloride 0.9 % 500 mL IVPB     2,000 mg 250 mL/hr over  120 Minutes Intravenous  Once 06/30/17 1305 06/30/17 1713   06/29/17 0415  piperacillin-tazobactam (ZOSYN) IVPB 3.375 g     3.375 g 12.5 mL/hr over 240 Minutes Intravenous Every 8 hours 06/29/17 0400     06/28/17 1300  ceFAZolin (ANCEF) IVPB 1 g/50 mL premix  Status:  Discontinued     1 g 100 mL/hr over 30 Minutes Intravenous Every 8 hours 06/27/17 1858 06/29/17 0400   06/11/2017 2000  ceFAZolin (ANCEF) IVPB 2g/100 mL premix  Status:  Discontinued     2 g 200 mL/hr over 30 Minutes Intravenous Every 6 hours 06/16/2017 1824 06/01/2017 1835   06/24/2017 1316  ceFAZolin (ANCEF) 3 g in dextrose 5 % 50 mL IVPB     3 g 130 mL/hr over 30 Minutes Intravenous 30 min pre-op 06/16/2017 1316 06/24/2017 1359   06/02/2017 1400  cefTRIAXone (ROCEPHIN) 1 g in dextrose 5 % 50 mL IVPB  Status:  Discontinued     1 g 100 mL/hr over 30 Minutes Intravenous Every 24 hours 06/05/2017 1334 06/27/17 1838      Subjective: Unable to assess  Objective: Vitals:   06/30/17 1600 06/30/17 1700 06/30/17 1710 06/30/17 1800  BP: (!) 148/49 (!) 161/54  (!) 149/69  Pulse: 83 85 82 80  Resp: (!) 41 (!) 43 (!) 34 (!) 39  Temp: (!) 100.8 F (38.2 C) (!) 101.3 F (38.5 C) (!) 101.5 F (38.6 C) (!) 101.7 F (38.7 C)  TempSrc: Core (Comment)     SpO2: 90% (!) 88% 94% 91%  Weight:      Height:        Intake/Output Summary (Last 24 hours) at 06/30/17 1825 Last data filed at 06/30/17 1800  Gross per 24 hour  Intake             1250 ml  Output              600 ml  Net              650 ml   Filed Weights   06/30/2017 0110 06/29/17 0355  Weight: 134.9 kg (297 lb 6.4 oz) 133.6 kg (294 lb 8.6 oz)    Examination: General exam: Awake, laying in bed, in mild discomfort and resp distress Respiratory system: Mildly increased respiratory effort, no wheezing Cardiovascular system: regular rate, s1, s2 Gastrointestinal system: distended, tympanic, decreased BS Central nervous system: CN2-12 grossly intact, strength intact Extremities:  Perfused, no clubbing Skin: Normal skin turgor, no notable skin lesions seen Psychiatry: difficult to assess given current respiratory state   Data Reviewed: I have personally reviewed following labs and imaging studies  CBC:  Recent Labs Lab 06/06/2017 0352  06/23/2017 0505 06/26/17 0523 06/27/17 0349 06/28/17 1108 06/29/17 0559 06/30/17 0307  WBC 17.2*  < > 10.5 15.2* 16.0* 18.4* 15.1* 27.3*  NEUTROABS 13.9*  --  7.9* 13.6* 13.3*  --  12.4*  --   HGB 15.1  < > 13.2 11.7* 10.9* 13.7 13.0 12.7*  HCT 44.8  < > 39.9 34.8* 33.3* 40.9 39.7 38.0*  MCV 88.0  < > 88.1 87.2 88.6 87.0 86.9 87.8  PLT 192  < > 175 180 199 271 260 244  < > = values in this interval not displayed. Basic Metabolic Panel:  Recent Labs Lab 06/26/17 0523 06/27/17 0349 06/28/17 1108 06/29/17 0559 06/30/17 0307  NA 139 140 141 145 146*  K  4.0 3.6 3.1* 3.6 3.3*  CL 109 112* 109 113* 112*  CO2 23 20* 21* 23 21*  GLUCOSE 169* 158* 167* 154* 179*  BUN 38* 48* 41* 48* 59*  CREATININE 2.16* 1.84* 1.53* 1.53* 2.13*  CALCIUM 7.7* 7.6* 8.2* 8.3* 8.2*  MG  --   --   --  2.3  --   PHOS  --  2.9  --   --   --    GFR: Estimated Creatinine Clearance: 45 mL/min (A) (by C-G formula based on SCr of 2.13 mg/dL (H)). Liver Function Tests:  Recent Labs Lab 06/27/17 0349  ALBUMIN 2.3*    Recent Labs Lab 06/07/2017 0945  LIPASE 18   No results for input(s): AMMONIA in the last 168 hours. Coagulation Profile:  Recent Labs Lab 06/01/2017 0945  INR 1.36   Cardiac Enzymes:  Recent Labs Lab 06/13/2017 0353 06/22/2017 0945  CKTOTAL  --  85  TROPONINI <0.03  --    BNP (last 3 results) No results for input(s): PROBNP in the last 8760 hours. HbA1C: No results for input(s): HGBA1C in the last 72 hours. CBG: No results for input(s): GLUCAP in the last 168 hours. Lipid Profile: No results for input(s): CHOL, HDL, LDLCALC, TRIG, CHOLHDL, LDLDIRECT in the last 72 hours. Thyroid Function Tests: No results for  input(s): TSH, T4TOTAL, FREET4, T3FREE, THYROIDAB in the last 72 hours. Anemia Panel: No results for input(s): VITAMINB12, FOLATE, FERRITIN, TIBC, IRON, RETICCTPCT in the last 72 hours. Sepsis Labs: No results for input(s): PROCALCITON, LATICACIDVEN in the last 168 hours.  Recent Results (from the past 240 hour(s))  Culture, Urine     Status: Abnormal   Collection Time: 06/23/2017  3:40 AM  Result Value Ref Range Status   Specimen Description URINE, RANDOM  Final   Special Requests NONE  Final   Culture 50,000 COLONIES/mL PROTEUS MIRABILIS (A)  Final   Report Status 06/27/2017 FINAL  Final   Organism ID, Bacteria PROTEUS MIRABILIS (A)  Final      Susceptibility   Proteus mirabilis - MIC*    AMPICILLIN <=2 SENSITIVE Sensitive     CEFAZOLIN <=4 SENSITIVE Sensitive     CEFTRIAXONE <=1 SENSITIVE Sensitive     CIPROFLOXACIN <=0.25 SENSITIVE Sensitive     GENTAMICIN <=1 SENSITIVE Sensitive     IMIPENEM 0.5 SENSITIVE Sensitive     NITROFURANTOIN >=512 RESISTANT Resistant     TRIMETH/SULFA <=20 SENSITIVE Sensitive     AMPICILLIN/SULBACTAM <=2 SENSITIVE Sensitive     PIP/TAZO <=4 SENSITIVE Sensitive     * 50,000 COLONIES/mL PROTEUS MIRABILIS  Surgical pcr screen     Status: None   Collection Time: 06/18/2017  8:44 PM  Result Value Ref Range Status   MRSA, PCR NEGATIVE NEGATIVE Final   Staphylococcus aureus NEGATIVE NEGATIVE Final    Comment:        The Xpert SA Assay (FDA approved for NASAL specimens in patients over 6 years of age), is one component of a comprehensive surveillance program.  Test performance has been validated by [facility] for patients greater than or equal to 44 year old. It is not intended to diagnose infection nor to guide or monitor treatment.   Culture, Urine     Status: Abnormal   Collection Time: 06/13/2017 12:36 AM  Result Value Ref Range Status   Specimen Description URINE, CATHETERIZED  Final   Special Requests NONE  Final   Culture (A)  Final     <10,000 COLONIES/mL Performed at  [facility], [place] [address] [place], [place] [address]    Report Status 06/26/2017 FINAL  Final  Culture, blood (Routine X 2) w Reflex to ID Panel     Status: None (Preliminary result)   Collection Time: 06/28/17  5:07 PM  Result Value Ref Range Status   Specimen Description BLOOD LEFT ANTECUBITAL  Final   Special Requests IN PEDIATRIC BOTTLE Blood Culture adequate volume  Final   Culture   Final    NO GROWTH 2 DAYS Performed at [facility], [place] [address], [place], [place] [address]    Report Status PENDING  Incomplete  Culture, blood (Routine X 2) w Reflex to ID Panel     Status: None (Preliminary result)   Collection Time: 06/28/17  5:21 PM  Result Value Ref Range Status   Specimen Description BLOOD LEFT HAND  Final   Special Requests   Final    BOTTLES DRAWN AEROBIC ONLY Blood Culture adequate volume   Culture   Final    NO GROWTH 2 DAYS Performed at [facility], [place] [address] [place], [place] [address]    Report Status PENDING  Incomplete  MRSA PCR Screening     Status: None   Collection Time: 06/29/17  8:00 AM  Result Value Ref Range Status   MRSA by PCR NEGATIVE NEGATIVE Final    Comment:        The GeneXpert MRSA Assay (FDA approved for NASAL specimens only), is one component of a comprehensive MRSA colonization surveillance program. It is not intended to diagnose MRSA infection nor to guide or monitor treatment for MRSA infections.      Radiology Studies: Dg Abd 1 View  Result Date: 06/30/2017 CLINICAL DATA:  NG tube placement EXAM: ABDOMEN - 1 VIEW COMPARISON:  06/29/2017 FINDINGS: Patchy atelectasis or infiltrate at the right base. Interim placement of esophageal tube, tip projects over the gastric outlet. Surgical clips in the right upper quadrant. Dilated loops of small bowel again demonstrated in the upper abdomen. IMPRESSION: Esophageal tube tip overlies the distal stomach. Dilated loops of bowel re-  demonstrated in the upper abdomen Electronically Signed   By: Donavan Foil M.D.   On: 06/30/2017 00:24   Ct Abdomen Pelvis W Contrast  Result Date: 06/29/2017 CLINICAL DATA:  Abdominal distention, pain, nausea, vomiting. EXAM: CT ABDOMEN AND PELVIS WITH CONTRAST TECHNIQUE: Multidetector CT imaging of the abdomen and pelvis was performed using the standard protocol following bolus administration of intravenous contrast. CONTRAST:  134mL ISOVUE-300 IOPAMIDOL (ISOVUE-300) INJECTION 61% COMPARISON:  Plain films 06/29/2017 CT 06/10/2017 FINDINGS: Lower chest: Areas of consolidation in the lower lobes bilaterally concerning for pneumonia. Trace left effusion. Heart is normal size. Hepatobiliary: No focal liver abnormality is seen. Status post cholecystectomy. No biliary dilatation. Pancreas: No focal abnormality or ductal dilatation. Spleen: No focal abnormality.  Normal size. Adrenals/Urinary Tract: Small cysts in the kidneys bilaterally. Mild right hydronephrosis. No visible renal or ureteral stones. Hydronephrosis has decreased slightly since prior study and the previously seen 5 mm mid right ureteral stone no longer visualized. Inflammation around the right kidney has decreased. Adrenal glands and urinary bladder unremarkable. Stomach/Bowel: NG tube is in the stomach. Diffuse gaseous distention of bowel to the level of the sigmoid colon. There is no obstructing process. Findings likely reflect ileus. Appendix is visualized and is normal. Vascular/Lymphatic: No evidence of aneurysm or adenopathy. Aortic calcifications. Reproductive: Central prostate calcifications. Other: No free fluid or free air. Musculoskeletal: No acute  bony abnormality. IMPRESSION: Diffuse gaseous distention of large and small bowel to the level of the sigmoid colon. Findings most compatible with ileus. NG tube is in the decompressed stomach. Bilateral lower lobe airspace opacities, left greater than right concerning for pneumonia. Previously  seen 5 mm mid right ureteral stone no longer visualized. Mild right hydronephrosis which has improved since prior study. Decreasing perinephric stranding. Electronically Signed   By: Rolm Baptise M.D.   On: 06/29/2017 07:12   Dg Chest Port 1 View  Result Date: 06/30/2017 CLINICAL DATA:  Follow-up pneumonia. EXAM: PORTABLE CHEST 1 VIEW 6:04 p.m.: COMPARISON:  Portable chest x-ray earlier today 8:14 a.m., 06/29/2017 and earlier. FINDINGS: Suboptimal inspiration. Cardiac silhouette mildly enlarged, unchanged. Airspace consolidation involving the left lung, unchanged. Dense consolidation in the right lower lobe, worse than earlier today. Lungs otherwise clear. Pulmonary vascularity normal without evidence of pulmonary edema. Nasogastric tube tip projects over the expected location of the gastric fundus. IMPRESSION: 1. Stable left lung pneumonia. 2. Worsening atelectasis and/or pneumonia involving the right lower lobe since earlier today. 3. Mild cardiomegaly without pulmonary edema. Electronically Signed   By: Evangeline Dakin M.D.   On: 06/30/2017 18:19   Dg Chest Port 1 View  Result Date: 06/30/2017 CLINICAL DATA:  Vomiting with aspiration pneumonia. EXAM: PORTABLE CHEST 1 VIEW COMPARISON:  Portable chest x-ray of June 29, 2017 FINDINGS: There has been interval blooming of the alveolar opacities on the left consistent with known aspiration. On the right the lung appears clear. The patient is rotated slightly toward the right. The cardiac silhouette remains enlarged. The pulmonary vascularity is not clearly engorged. The esophagogastric tube tip lies in the gastric cardia with the proximal port at approximately the level of the GE junction. IMPRESSION: Interval increase in alveolar opacity in the left mid and lower lung consistent with the suspected aspiration pneumonia. No definite right-sided pneumonia. Advancement of the nasogastric tube by 5-10 cm would assure that the proximal port remains below the GE  junction. Electronically Signed   By: David  Martinique M.D.   On: 06/30/2017 08:50   Dg Chest Port 1 View  Result Date: 06/29/2017 CLINICAL DATA:  Abdominal distention tonight. EXAM: PORTABLE CHEST 1 VIEW COMPARISON:  06/28/2017 FINDINGS: Limited study. Patchy central lung opacities bilaterally; this appears more prominent than on 06/20/2017. Cannot exclude alveolar edema or early infectious infiltrate. No effusions. No pneumothorax. IMPRESSION: Limited study. Mildly prominent central lung opacities bilaterally, appearing more prominent than on 06/04/2017. Electronically Signed   By: Andreas Newport M.D.   On: 06/29/2017 02:55   Dg Chest Port 1 View  Result Date: 06/28/2017 CLINICAL DATA:  Cough, congestion, nausea, vomiting EXAM: PORTABLE CHEST 1 VIEW COMPARISON:  06/01/2017 FINDINGS: Cardiomegaly with vascular congestion. Bilateral perihilar and lower lobe opacities could reflect early edema or infection. No effusions or acute bony abnormality. IMPRESSION: Cardiomegaly, vascular congestion. Bilateral lower lobe opacities could reflect edema or infection. Electronically Signed   By: Rolm Baptise M.D.   On: 06/28/2017 18:36   Dg Abd Portable 1v  Result Date: 06/30/2017 CLINICAL DATA:  Vomiting EXAM: PORTABLE ABDOMEN - 1 VIEW COMPARISON:  06/29/2017 FINDINGS: NG tube has pulled back with the tip in the fundus of the stomach. Gaseous distention of bowel again noted which appears to be both large and small bowel. This may have worsened since prior study. IMPRESSION: Worsening gaseous distention of bowel which appears to be both large and small bowel suggesting ileus. Electronically Signed   By: Rolm Baptise M.D.  On: 06/30/2017 08:48   Dg Abd Portable 2v  Result Date: 06/29/2017 CLINICAL DATA:  Vomiting and abdominal distention tonight. EXAM: PORTABLE ABDOMEN - 2 VIEW COMPARISON:  CT 06/12/2017 FINDINGS: Prominent gaseous distention of the stomach, small and large bowel. No extraluminal gas is evident  on this limited study. Cannot exclude a bowel obstruction. No conclusive urinary calculi. IMPRESSION: Limited study, with prominent gaseous distention of stomach, small bowel and colon. This could represent bowel obstruction or adynamic ileus; no visible free air. Electronically Signed   By: Andreas Newport M.D.   On: 06/29/2017 02:52    Scheduled Meds: . bisacodyl  10 mg Rectal Daily  . levothyroxine  112 mcg Intravenous Daily  . metoprolol tartrate  5 mg Intravenous Q6H   Continuous Infusions: . sodium chloride 75 mL/hr at 06/30/17 0801  . methocarbamol (ROBAXIN)  IV    . piperacillin-tazobactam (ZOSYN)  IV 3.375 g (06/30/17 1346)  . potassium chloride    . potassium chloride    . [START ON 07/01/2017] vancomycin       LOS: 6 days   Arbie Blankley, Orpah Melter, MD [facility] Pager [phone]  If 7PM-7AM, please contact night-coverage [email or web address] Password [facility] 06/30/2017, 6:25 PM

## 2017-06-30 NOTE — Progress Notes (Signed)
Orthopedics Progress Note  Subjective: Patient remains critically ill on a nonrebreather mask due to bilateral pneumonia.  Objective:  Vitals:   06/30/17 1200 06/30/17 1300  BP: (!) 144/66 (!) 133/53  Pulse: 78 75  Resp: (!) 40 (!) 39  Temp:    SpO2: 94% 92%    General: Awake and alert  Musculoskeletal: right shoulder dressing clean and dry and intact, moderate but expected swelling in the arm and hand.  Hand and wrist supple. Arm position is perfect Neurovascularly intact  Lab Results  Component Value Date   WBC 27.3 (H) 06/30/2017   HGB 12.7 (L) 06/30/2017   HCT 38.0 (L) 06/30/2017   MCV 87.8 06/30/2017   PLT 244 06/30/2017       Component Value Date/Time   NA 146 (H) 06/30/2017 0307   K 3.3 (L) 06/30/2017 0307   CL 112 (H) 06/30/2017 0307   CO2 21 (L) 06/30/2017 0307   GLUCOSE 179 (H) 06/30/2017 0307   BUN 59 (H) 06/30/2017 0307   CREATININE 2.13 (H) 06/30/2017 0307   CREATININE 0.99 11/28/2013 1536   CALCIUM 8.2 (L) 06/30/2017 0307   GFRNONAA 30 (L) 06/30/2017 0307   GFRNONAA 80 11/28/2013 1536   GFRAA 35 (L) 06/30/2017 0307   GFRAA >89 11/28/2013 1536    Lab Results  Component Value Date   INR 1.36 06/04/2017   INR 1.06 11/04/2012   INR 1.04 08/07/2009    Assessment/Plan: POD #6 s/p Procedure(s): reverse right SHOULDER ARTHROPLASTY CYSTOSCOPY WITH RETROGRADE PYELOGRAM, URETEROSCOPY AND STENT PLACEMENT basket extraction stone Patient stable from orthopedic standpoint but is very sick with pneumonia and ileus which has worsened. OT and nursing working on wrist/hand and elbow ROM at the bedside.  NO need to range the shoulder.  Keep pillow propped behind the elbow as you are doing.  Call if there are any issues.  Patient would need outpatient follow up in two weeks in the office.  Doran Heater. Veverly Fells, MD 06/30/2017 1:55 PM

## 2017-06-30 NOTE — Consult Note (Signed)
[facility] Surgery Consult Note  Eduardo Herman [birth year]  [id number].    Requesting MD: Wyline Copas Chief Complaint/Reason for Consult: Ileus  HPI:  Eduardo Herman is a 66yo male PMH Down syndrome, morbid obesity, hypothyroidism, PVD, tobacco abuse, CKD-3, chronic venous insufficiency in legs, who was admitted to [facility] 8/24 after suffering a right proximal humerus fracture 8/23. He underwent right reverse total shoulder replacement 8/25 by Dr. Veverly Fells. Also upon admission he was complaining of abdominal pain and found to have a ureteral stone with possible pyelonephritis; he underwent cystoscopy with placement of right ureteral stent 8/25 by Dr. Tresa Moore. Initially did well postoperatively until yesterday when he began having abdominal pain/distension, nausea, and vomiting. CT scan abdomen pelvis showed an ileus with significant gaseous distension of the large and small bowel. He unfortunately aspirated during one of his vomiting spells and now also has aspiration pneumonia. NG tube is in place and he was started on zosyn for pneumonia. General surgery asked to see for assistance with management of ileus.  No family currently at bedside. Patient minimally responsive therefore history was taken from his chart.  PMH significant for Down syndrome, morbid obesity, hypothyroidism, PVD, tobacco abuse, CKD-3, chronic venous insufficiency in legs Abdominal surgical history: laparoscopic cholecystectomy Daily smoker  ROS: ROS  Unable to assess ROS as patient is not very responsive  History reviewed. No pertinent family history.  Past Medical History:  Diagnosis Date  . Angioedema of lips    likely secondary to ACE inhibitor.   . Down's syndrome   . Hypertension   . Hypothyroidism   . Mental disorder   . Peripheral vascular disease ([place])   . Shortness of breath     Past Surgical History:  Procedure Laterality Date  . CHOLECYSTECTOMY    . CYSTOSCOPY WITH RETROGRADE PYELOGRAM, URETEROSCOPY AND  STENT PLACEMENT Right 06/24/2017   Procedure: CYSTOSCOPY WITH RETROGRADE PYELOGRAM, URETEROSCOPY AND STENT PLACEMENT basket extraction stone;  Surgeon: Alexis Frock, MD;  Location: [facility]S;  Service: Urology;  Laterality: Right;  . ORIF HUMERUS FRACTURE Right 06/06/2017   Procedure: reverse right SHOULDER ARTHROPLASTY;  Surgeon: Netta Cedars, MD;  Location: [facility]S;  Service: Orthopedics;  Laterality: Right;    Social History:  reports that he has been smoking Cigarettes.  He has never used smokeless tobacco. He reports that he does not drink alcohol or use drugs.  Allergies:  Allergies  Allergen Reactions  . Lisinopril Swelling    Angioedema of the tongue    Medications Prior to Admission  Medication Sig Dispense Refill  . atenolol (TENORMIN) 100 MG tablet Take 100 mg by mouth daily.    Marland Kitchen diltiazem (TIAZAC) 360 MG 24 hr capsule Take 360 mg by mouth daily.    . furosemide (LASIX) 80 MG tablet Take 0.5 tablets (40 mg total) by mouth daily. Resume in 2 days (Patient taking differently: Take 80 mg by mouth daily. )    . levothyroxine (SYNTHROID, LEVOTHROID) 200 MCG tablet Take 200 mcg by mouth daily. Takes along 71mg to make a total 2213m    . levothyroxine (SYNTHROID, LEVOTHROID) 25 MCG tablet Take 25 mcg by mouth daily before breakfast.    . omega-3 acid ethyl esters (LOVAZA) 1 g capsule Take 1 g by mouth daily.    . potassium chloride SA (K-DUR,KLOR-CON) 20 MEQ tablet Take 20 mEq by mouth daily.    . Potassium Chloride ER 20 MEQ TBCR Take 20 mEq by mouth 3 (three) times daily. (Patient not taking: Reported on  07/01/2017) 270 tablet 1    Prior to Admission medications   Medication Sig Start Date End Date Taking? Authorizing Provider  atenolol (TENORMIN) 100 MG tablet Take 100 mg by mouth daily.   Yes [provider]  diltiazem (TIAZAC) 360 MG 24 hr capsule Take 360 mg by mouth daily. 06/16/17  Yes [provider]  furosemide (LASIX) 80 MG tablet Take 0.5 tablets (40 mg  total) by mouth daily. Resume in 2 days Patient taking differently: Take 80 mg by mouth daily.  06/12/15  Yes Rodolph Bong, MD  levothyroxine (SYNTHROID, LEVOTHROID) 200 MCG tablet Take 200 mcg by mouth daily. Takes along to make a total   Yes [provider]  levothyroxine (SYNTHROID, LEVOTHROID) 25 MCG tablet Take 25 mcg by mouth daily before breakfast.   Yes [provider]  omega-3 acid ethyl esters (LOVAZA) 1 g capsule Take 1 g by mouth daily.   Yes [provider]  potassium chloride SA (K-DUR,KLOR-CON) 20 MEQ tablet Take 20 mEq by mouth daily.   Yes [provider]  HYDROcodone-acetaminophen (NORCO) 5-325 MG tablet Take 1 tablet by mouth every 6 (six) hours as needed for moderate pain. 06/07/2017   Beverely Low, MD  methocarbamol (ROBAXIN) 500 MG tablet Take 1 tablet (500 mg total) by mouth 3 (three) times daily as needed. 06/17/2017   Beverely Low, MD  Potassium Chloride ER 20 MEQ TBCR Take 20 mEq by mouth 3 (three) times daily. Patient not taking: Reported on 06/28/2017 12/11/13   Quentin Mulling, PA-C    Blood pressure (!) 144/57, pulse 77, temperature 98.5 F (36.9 C), temperature source Axillary, resp. rate (!) 39, height 5\' 8"  (1.727 m), weight 294 lb 8.6 oz (133.6 kg), SpO2 96 %. Physical Exam: General: obese white male who is laying in bed in NAD, drowsy and minimally responsive HEENT: head is normocephalic, atraumatic.  Sclera are noninjected.  Pupils equal and round.  Ears and nose without any masses or lesions.  Mouth is dry. Dentition fair Heart: regular, rate, and rhythm.  No obvious murmurs, gallops, or rubs noted.  Palpable pedal pulses bilaterally Lungs: diffuse rhonchi bilaterally, no wheezes noted.  Respiratory effort nonlabored on NRB Abd: multiple lap incisions cdi, soft, NT/ND, +BS, no masses, hernias, or organomegaly RUE: shoulder sling in place, dry dressing noted to anterior shoulder BLE: lower extremity venous  stasis Skin: warm and dry with no masses, lesions, or rashes Psych: unable to assess orientation Neuro: minimally responsive  Results for orders placed or performed during the hospital encounter of 06/10/2017 (from the past 48 hour(s))  Basic metabolic panel     Status: Abnormal   Collection Time: 06/28/17 11:08 AM  Result Value Ref Range   Sodium 141 135 - 145 mmol/L   Potassium 3.1 (L) 3.5 - 5.1 mmol/L   Chloride 109 101 - 111 mmol/L   CO2 21 (L) 22 - 32 mmol/L   Glucose, Bld 167 (H) 65 - 99 mg/dL   BUN 41 (H) 6 - 20 mg/dL   Creatinine, Ser 06/30/17 (H) 0.61 - 1.24 mg/dL   Calcium 8.2 (L) 8.9 - 10.3 mg/dL   GFR calc non Af Amer 45 (L) >60 mL/min   GFR calc Af Amer 53 (L) >60 mL/min    Comment: (NOTE) The eGFR has been calculated using the CKD EPI equation. This calculation has not been validated in all clinical situations. eGFR's persistently <60 mL/min signify possible Chronic Kidney Disease.    Anion gap 11  5 - 15  CBC     Status: Abnormal   Collection Time: 06/28/17 11:08 AM  Result Value Ref Range   WBC 18.4 (H) 4.0 - 10.5 K/uL   RBC 4.70 4.22 - 5.81 MIL/uL   Hemoglobin 13.7 13.0 - 17.0 g/dL    Comment: DELTA CHECK NOTED REPEATED TO VERIFY    HCT 40.9 39.0 - 52.0 %   MCV 87.0 78.0 - 100.0 fL   MCH 29.1 26.0 - 34.0 pg   MCHC 33.5 30.0 - 36.0 g/dL   RDW 15.8 (H) 11.5 - 15.5 %   Platelets 271 150 - 400 K/uL  Culture, blood (Routine X 2) w Reflex to ID Panel     Status: None (Preliminary result)   Collection Time: 06/28/17  5:07 PM  Result Value Ref Range   Specimen Description BLOOD LEFT ANTECUBITAL    Special Requests IN PEDIATRIC BOTTLE Blood Culture adequate volume    Culture      NO GROWTH < 24 HOURS Performed at [facility], [place] [address], [place], [place] [address]    Report Status PENDING   Culture, blood (Routine X 2) w Reflex to ID Panel     Status: None (Preliminary result)   Collection Time: 06/28/17  5:21 PM  Result Value Ref Range   Specimen  Description BLOOD LEFT HAND    Special Requests      BOTTLES DRAWN AEROBIC ONLY Blood Culture adequate volume   Culture      NO GROWTH < 24 HOURS Performed at [facility], [place] [address] [place],  [address]    Report Status PENDING   Blood gas, arterial     Status: Abnormal   Collection Time: 06/29/17  1:40 AM  Result Value Ref Range   O2 Content 6.0 L/min   Delivery systems NASAL CANNULA    pH, Arterial 7.424 7.350 - 7.450   pCO2 arterial 32.4 32.0 - 48.0 mmHg   pO2, Arterial 57.3 (L) 83.0 - 108.0 mmHg   Bicarbonate 20.8 20.0 - 28.0 mmol/L   Acid-base deficit 2.2 (H) 0.0 - 2.0 mmol/L   O2 Saturation 87.9 %   Patient temperature 98.9    Collection site LEFT RADIAL    Drawn by [id number]    Sample type ARTERIAL    Allens test (pass/fail) PASS PASS  Occult blood gastric / duodenum     Status: Abnormal   Collection Time: 06/29/17  2:36 AM  Result Value Ref Range   pH, Gastric NOT DONE    Occult Blood, Gastric POSITIVE (A) NEGATIVE  Magnesium     Status: None   Collection Time: 06/29/17  5:59 AM  Result Value Ref Range   Magnesium 2.3 1.7 - 2.4 mg/dL  CBC with Differential/Platelet     Status: Abnormal   Collection Time: 06/29/17  5:59 AM  Result Value Ref Range   WBC 15.1 (H) 4.0 - 10.5 K/uL   RBC 4.57 4.22 - 5.81 MIL/uL   Hemoglobin 13.0 13.0 - 17.0 g/dL   HCT 39.7 39.0 - 52.0 %   MCV 86.9 78.0 - 100.0 fL   MCH 28.4 26.0 - 34.0 pg   MCHC 32.7 30.0 - 36.0 g/dL   RDW 16.0 (H) 11.5 - 15.5 %   Platelets 260 150 - 400 K/uL   Neutrophils Relative % 83 %   Lymphocytes Relative 5 %   Monocytes Relative 11 %   Eosinophils Relative 0 %   Basophils Relative 1 %  Neutro Abs 12.4 (H) 1.7 - 7.7 K/uL   Lymphs Abs 0.8 0.7 - 4.0 K/uL   Monocytes Absolute 1.7 (H) 0.1 - 1.0 K/uL   Eosinophils Absolute 0.0 0.0 - 0.7 K/uL   Basophils Absolute 0.2 (H) 0.0 - 0.1 K/uL   WBC Morphology MILD LEFT SHIFT (1-5% METAS, OCC MYELO, OCC BANDS)   Basic metabolic panel     Status:  Abnormal   Collection Time: 06/29/17  5:59 AM  Result Value Ref Range   Sodium 145 135 - 145 mmol/L   Potassium 3.6 3.5 - 5.1 mmol/L   Chloride 113 (H) 101 - 111 mmol/L   CO2 23 22 - 32 mmol/L   Glucose, Bld 154 (H) 65 - 99 mg/dL   BUN 48 (H) 6 - 20 mg/dL   Creatinine, Ser 6.65 (H) 0.61 - 1.24 mg/dL   Calcium 8.3 (L) 8.9 - 10.3 mg/dL   GFR calc non Af Amer 45 (L) >60 mL/min   GFR calc Af Amer 53 (L) >60 mL/min    Comment: (NOTE) The eGFR has been calculated using the CKD EPI equation. This calculation has not been validated in all clinical situations. eGFR's persistently <60 mL/min signify possible Chronic Kidney Disease.    Anion gap 9 5 - 15  MRSA PCR Screening     Status: None   Collection Time: 06/29/17  8:00 AM  Result Value Ref Range   MRSA by PCR NEGATIVE NEGATIVE    Comment:        The GeneXpert MRSA Assay (FDA approved for NASAL specimens only), is one component of a comprehensive MRSA colonization surveillance program. It is not intended to diagnose MRSA infection nor to guide or monitor treatment for MRSA infections.   Basic metabolic panel     Status: Abnormal   Collection Time: 06/30/17  3:07 AM  Result Value Ref Range   Sodium 146 (H) 135 - 145 mmol/L   Potassium 3.3 (L) 3.5 - 5.1 mmol/L   Chloride 112 (H) 101 - 111 mmol/L   CO2 21 (L) 22 - 32 mmol/L   Glucose, Bld 179 (H) 65 - 99 mg/dL   BUN 59 (H) 6 - 20 mg/dL   Creatinine, Ser 2.69 (H) 0.61 - 1.24 mg/dL   Calcium 8.2 (L) 8.9 - 10.3 mg/dL   GFR calc non Af Amer 30 (L) >60 mL/min   GFR calc Af Amer 35 (L) >60 mL/min    Comment: (NOTE) The eGFR has been calculated using the CKD EPI equation. This calculation has not been validated in all clinical situations. eGFR's persistently <60 mL/min signify possible Chronic Kidney Disease.    Anion gap 13 5 - 15  CBC     Status: Abnormal   Collection Time: 06/30/17  3:07 AM  Result Value Ref Range   WBC 27.3 (H) 4.0 - 10.5 K/uL   RBC 4.33 4.22 - 5.81  MIL/uL   Hemoglobin 12.7 (L) 13.0 - 17.0 g/dL   HCT 71.9 (L) 53.8 - 67.3 %   MCV 87.8 78.0 - 100.0 fL   MCH 29.3 26.0 - 34.0 pg   MCHC 33.4 30.0 - 36.0 g/dL   RDW 05.2 (H) 08.1 - 58.6 %   Platelets 244 150 - 400 K/uL  Blood gas, arterial     Status: Abnormal   Collection Time: 06/30/17  9:05 AM  Result Value Ref Range   FIO2 60.00    Delivery systems OXYGEN MASK    pH, Arterial 7.467 (H) 7.350 -  7.450   pCO2 arterial 33.8 32.0 - 48.0 mmHg   pO2, Arterial 96.7 83.0 - 108.0 mmHg   Bicarbonate 24.2 20.0 - 28.0 mmol/L   Acid-Base Excess 1.3 0.0 - 2.0 mmol/L   O2 Saturation 97.3 %   Patient temperature 98.6    Collection site RIGHT RADIAL    Drawn by [phone]    Sample type ARTERIAL DRAW    Allens test (pass/fail) PASS PASS   Dg Abd 1 View  Result Date: 06/30/2017 CLINICAL DATA:  NG tube placement EXAM: ABDOMEN - 1 VIEW COMPARISON:  06/29/2017 FINDINGS: Patchy atelectasis or infiltrate at the right base. Interim placement of esophageal tube, tip projects over the gastric outlet. Surgical clips in the right upper quadrant. Dilated loops of small bowel again demonstrated in the upper abdomen. IMPRESSION: Esophageal tube tip overlies the distal stomach. Dilated loops of bowel re- demonstrated in the upper abdomen Electronically Signed   By: Donavan Foil M.D.   On: 06/30/2017 00:24   Ct Abdomen Pelvis W Contrast  Result Date: 06/29/2017 CLINICAL DATA:  Abdominal distention, pain, nausea, vomiting. EXAM: CT ABDOMEN AND PELVIS WITH CONTRAST TECHNIQUE: Multidetector CT imaging of the abdomen and pelvis was performed using the standard protocol following bolus administration of intravenous contrast. CONTRAST:  150m ISOVUE-300 IOPAMIDOL (ISOVUE-300) INJECTION 61% COMPARISON:  Plain films 06/29/2017 CT 06/20/2017 FINDINGS: Lower chest: Areas of consolidation in the lower lobes bilaterally concerning for pneumonia. Trace left effusion. Heart is normal size. Hepatobiliary: No focal liver abnormality is  seen. Status post cholecystectomy. No biliary dilatation. Pancreas: No focal abnormality or ductal dilatation. Spleen: No focal abnormality.  Normal size. Adrenals/Urinary Tract: Small cysts in the kidneys bilaterally. Mild right hydronephrosis. No visible renal or ureteral stones. Hydronephrosis has decreased slightly since prior study and the previously seen 5 mm mid right ureteral stone no longer visualized. Inflammation around the right kidney has decreased. Adrenal glands and urinary bladder unremarkable. Stomach/Bowel: NG tube is in the stomach. Diffuse gaseous distention of bowel to the level of the sigmoid colon. There is no obstructing process. Findings likely reflect ileus. Appendix is visualized and is normal. Vascular/Lymphatic: No evidence of aneurysm or adenopathy. Aortic calcifications. Reproductive: Central prostate calcifications. Other: No free fluid or free air. Musculoskeletal: No acute bony abnormality. IMPRESSION: Diffuse gaseous distention of large and small bowel to the level of the sigmoid colon. Findings most compatible with ileus. NG tube is in the decompressed stomach. Bilateral lower lobe airspace opacities, left greater than right concerning for pneumonia. Previously seen 5 mm mid right ureteral stone no longer visualized. Mild right hydronephrosis which has improved since prior study. Decreasing perinephric stranding. Electronically Signed   By: KRolm BaptiseM.D.   On: 06/29/2017 07:12   Dg Chest Port 1 View  Result Date: 06/30/2017 CLINICAL DATA:  Vomiting with aspiration pneumonia. EXAM: PORTABLE CHEST 1 VIEW COMPARISON:  Portable chest x-ray of June 29, 2017 FINDINGS: There has been interval blooming of the alveolar opacities on the left consistent with known aspiration. On the right the lung appears clear. The patient is rotated slightly toward the right. The cardiac silhouette remains enlarged. The pulmonary vascularity is not clearly engorged. The esophagogastric tube tip  lies in the gastric cardia with the proximal port at approximately the level of the GE junction. IMPRESSION: Interval increase in alveolar opacity in the left mid and lower lung consistent with the suspected aspiration pneumonia. No definite right-sided pneumonia. Advancement of the nasogastric tube by 5-10 cm would assure that the proximal  port remains below the GE junction. Electronically Signed   By: David  Martinique M.D.   On: 06/30/2017 08:50   Dg Chest Port 1 View  Result Date: 06/29/2017 CLINICAL DATA:  Abdominal distention tonight. EXAM: PORTABLE CHEST 1 VIEW COMPARISON:  06/28/2017 FINDINGS: Limited study. Patchy central lung opacities bilaterally; this appears more prominent than on 06/09/2017. Cannot exclude alveolar edema or early infectious infiltrate. No effusions. No pneumothorax. IMPRESSION: Limited study. Mildly prominent central lung opacities bilaterally, appearing more prominent than on 06/22/2017. Electronically Signed   By: Andreas Newport M.D.   On: 06/29/2017 02:55   Dg Chest Port 1 View  Result Date: 06/28/2017 CLINICAL DATA:  Cough, congestion, nausea, vomiting EXAM: PORTABLE CHEST 1 VIEW COMPARISON:  06/11/2017 FINDINGS: Cardiomegaly with vascular congestion. Bilateral perihilar and lower lobe opacities could reflect early edema or infection. No effusions or acute bony abnormality. IMPRESSION: Cardiomegaly, vascular congestion. Bilateral lower lobe opacities could reflect edema or infection. Electronically Signed   By: Rolm Baptise M.D.   On: 06/28/2017 18:36   Dg Abd Portable 1v  Result Date: 06/30/2017 CLINICAL DATA:  Vomiting EXAM: PORTABLE ABDOMEN - 1 VIEW COMPARISON:  06/29/2017 FINDINGS: NG tube has pulled back with the tip in the fundus of the stomach. Gaseous distention of bowel again noted which appears to be both large and small bowel. This may have worsened since prior study. IMPRESSION: Worsening gaseous distention of bowel which appears to be both large and small  bowel suggesting ileus. Electronically Signed   By: Rolm Baptise M.D.   On: 06/30/2017 08:48   Dg Abd Portable 2v  Result Date: 06/29/2017 CLINICAL DATA:  Vomiting and abdominal distention tonight. EXAM: PORTABLE ABDOMEN - 2 VIEW COMPARISON:  CT 06/13/2017 FINDINGS: Prominent gaseous distention of the stomach, small and large bowel. No extraluminal gas is evident on this limited study. Cannot exclude a bowel obstruction. No conclusive urinary calculi. IMPRESSION: Limited study, with prominent gaseous distention of stomach, small bowel and colon. This could represent bowel obstruction or adynamic ileus; no visible free air. Electronically Signed   By: Andreas Newport M.D.   On: 06/29/2017 02:52   Anti-infectives    Start     Dose/Rate Route Frequency Ordered Stop   06/29/17 0415  piperacillin-tazobactam (ZOSYN) IVPB 3.375 g     3.375 g 12.5 mL/hr over 240 Minutes Intravenous Every 8 hours 06/29/17 0400     06/28/17 1300  ceFAZolin (ANCEF) IVPB 1 g/50 mL premix  Status:  Discontinued     1 g 100 mL/hr over 30 Minutes Intravenous Every 8 hours 06/27/17 1858 06/29/17 0400   06/11/2017 2000  ceFAZolin (ANCEF) IVPB 2g/100 mL premix  Status:  Discontinued     2 g 200 mL/hr over 30 Minutes Intravenous Every 6 hours 06/07/2017 1824 06/22/2017 1835   06/04/2017 1316  ceFAZolin (ANCEF) 3 g in dextrose 5 % 50 mL IVPB     3 g 130 mL/hr over 30 Minutes Intravenous 30 min pre-op 06/23/2017 1316 06/10/2017 1359   06/10/2017 1400  cefTRIAXone (ROCEPHIN) 1 g in dextrose 5 % 50 mL IVPB  Status:  Discontinued     1 g 100 mL/hr over 30 Minutes Intravenous Every 24 hours 06/01/2017 1334 06/27/17 1838        Assessment/Plan Down syndrome Morbid obesity Hypothyroidism PVD Tobacco abuse CKD-3 Chronic venous insufficiency in legs S/p right reverse total shoulder replacement 8/25 Dr. Veverly Fells S/p right ureteral stent placement for stone and possible pyelonephritis 8/25 Dr. Tresa Moore Aspiration pneumonia  Ileus -  progressive abdominal pain/distension, nausea, vomiting yesterday - CT scan abdomen pelvis showed an ileus with significant gaseous distension of the large and small bowel - NG tube placed 8/29 and has 300cc output - XR this AM shows worsening gaseous distention of bowel which appears to be both large and small bowel suggesting ileus  ID - currently on zosyn 8/29>>day#2 for pneumonia VTE - SCDs FEN - IVF, NPO/NGT Foley - external catheter in place Follow up - TBD  Plan - Agree with NG tube for decompression. Will add daily dulcolax suppositories. Recommend limiting narcotics and keeping potassium >4 (he is receiving 3 runs of K today). If patient not improving may consider neostigmine.  Wellington Hampshire, [facility] 06/30/2017, 10:18 AM Pager: [phone] Consults: [phone] Mon-Fri 7:00 am-4:30 pm Sat-Sun 7:00 am-11:30 am

## 2017-06-30 NOTE — Progress Notes (Signed)
Occupational Therapy Treatment Patient Details Name: Eduardo Herman MRN: [id number] DOB: [birth year] Today's Date: 06/30/2017    History of present illness 66 y.o. male with medical history significant of Down syndrome, mental retardation, morbid obesity, hypothyroidism, PVD, tobacco abuse, CK-3, chronic venous insufficiency in legs, who presents with fall, right shoulder pain, abdominal pain and distention. s/p R reverse TSA and R ureteral stent placement and stone removal 06/14/2017    OT comments  Educated RN and family on sling positioning and PROM of elbow, wrist and hand. Pt did move hand on command this visit but most of ROM was PROM.   Follow Up Recommendations  SNF    Equipment Recommendations  None recommended by OT    Recommendations for Other Services      Precautions / Restrictions Precautions Precautions: Shoulder Type of Shoulder Precautions: conservative  Shoulder Interventions: Shoulder sling/immobilizer;Off for dressing/bathing/exercises;At all times Precaution Comments: No PROM and AROM; ROM elbow, wrist, hand.  sling at all times except for ADLs and exercise  Required Braces or Orthoses: Sling Restrictions Weight Bearing Restrictions: Yes RUE Weight Bearing: Non weight bearing       Mobility Bed Mobility              NT    Transfers        NT              Balance                                           ADL either performed or assessed with clinical judgement                  Cognition Arousal/Alertness: Lethargic;Suspect due to medications                                              Exercises Shoulder Exercises Elbow Flexion: PROM;Right;10 reps;Supine Elbow Extension: PROM;Right;10 reps;Supine Wrist Flexion: PROM;Right;10 reps;Supine Wrist Extension: PROM;Right;10 reps;Supine Digit Composite Flexion: PROM;Right;10 reps;Supine Composite Extension: PROM;AROM;Right;Supine;10 reps          Pertinent Vitals/ Pain       Faces Pain Scale: No hurt         Frequency  Min 2X/week        Progress Toward Goals  OT Goals(current goals can now be found in the care plan section)  Progress towards OT goals: Not progressing toward goals - comment     Plan Discharge plan remains appropriate       AM-PAC PT "6 Clicks" Daily Activity     Outcome Measure   Help from another person eating meals?: Total Help from another person taking care of personal grooming?: Total Help from another person toileting, which includes using toliet, bedpan, or urinal?: Total Help from another person bathing (including washing, rinsing, drying)?: Total Help from another person to put on and taking off regular upper body clothing?: Total Help from another person to put on and taking off regular lower body clothing?: Total 6 Click Score: 6    End of Session    OT Visit Diagnosis: Muscle weakness (generalized) (M62.81)   Activity Tolerance Patient limited by lethargy   Patient Left in bed;with call bell/phone within reach;with nursing/sitter in room;with family/visitor present   Nurse  Communication Other (comment) (sling positioning and PROM)        Time: 1215-1227 OT Time Calculation (min): 12 min  Charges: OT General Charges $OT Visit: 1 Visit OT Treatments $Therapeutic Activity: 8-22 mins  [place], [place]   Betsy Pries 06/30/2017, 12:31 PM

## 2017-06-30 NOTE — Progress Notes (Signed)
[place] Progress Note Patient Name: Eduardo Herman DOB: January 05, 1951 MRN: [id number]   Date of Service  06/30/2017  HPI/Events of Note  Contacted by bedside nurse regarding concern for respiratory and mental status. Per report patient has been tachypneic and on nonrebreather last night and today. Respiratory status has not appreciably changed as per bedside nurse. Patient with limited level of alertness on camera. Opens eyes and intermittently moving attempting to remove nonrebreather mask. Patient does have NG tube in place to suction. ABG reviewed demonstrating normal pH. Morning portable chest x-ray reviewed. Patient currently full code. Patient unable to cooperate with pulmonary toilette.   eICU Interventions  1. Checking stat portable chest x-ray 2. Checking stat ABG 3. Consider endotracheal intubation pending these results versus further monitoring      Intervention Category Major Interventions: Respiratory failure - evaluation and management  Tera Partridge 06/30/2017, 5:51 PM

## 2017-06-30 NOTE — Progress Notes (Signed)
Pharmacy Antibiotic Note  Eduardo Herman is a 66 y.o. male admitted on 06/26/2017 with Aspiration event.  Pharmacy has been consulted for Zosyn dosing.  Now adding Vancomycin.  06/30/2017  Afebrile  Leukocytosis- WBC trending up 15.1>>27.3  AKI- Scr worsening 1.53>>2.13 although 24hr I/O=(-550).  NCrCl ~30-56ml/min.   MRSA PCR negative  Plan: Continue Zosyn 3.375gm IV Q8h to be infused over 4hrs Add Vancomycin 2gm IV x1 now then 1500mg  IV q24h Monitor renal function and cx data   Height: 5\' 8"  (172.7 cm) Weight: 294 lb 8.6 oz (133.6 kg) IBW/kg (Calculated) : 68.4  Temp (24hrs), Avg:98.8 F (37.1 C), Min:98.5 F (36.9 C), Max:99.6 F (37.6 C)   Recent Labs Lab 06/26/17 0523 06/27/17 0349 06/28/17 1108 06/29/17 0559 06/30/17 0307  WBC 15.2* 16.0* 18.4* 15.1* 27.3*  CREATININE 2.16* 1.84* 1.53* 1.53* 2.13*    Estimated Creatinine Clearance: 45 mL/min (A) (by C-G formula based on SCr of 2.13 mg/dL (H)).    Allergies  Allergen Reactions  . Lisinopril Swelling    Angioedema of the tongue    Antimicrobials this admission: Cefazolin 8/27 >> 8/29 Ceftriaxone 8/24 >> 8/27 Zosyn 8/29 >> Vanc 8/30>>  Dose adjustments this admission:  Microbiology results:  8/24 MRSA pcr: neg 8/24 ucx: 50K proteus mirabilis (R= nitrof) 8/25 ucx: <10K FINAL  8/28 BCx x2: NGTD 8/29 MRSA PCR: neg  Thank you for allowing pharmacy to be a part of this patient's care.  Biagio Borg 06/30/2017 1:06 PM

## 2017-06-30 NOTE — Consult Note (Signed)
PULMONARY / CRITICAL CARE MEDICINE   Name: Eduardo Herman MRN: [id number] DOB: 17-Jul-1951    ADMISSION DATE:  06/09/2017 CONSULTATION DATE: 8/30  REFERRING MD:  [facility]  CHIEF COMPLAINT:  Aspiration  HISTORY OF PRESENT ILLNESS:   72 mo wm with PMH of Downs syndrome , tobacco abuse, chronic lower ext venous statis who feel 8/24 and required reverse rt shoulder replacement and rt ureteral stone extraction and stent placement by Drs. Norris and [employer]. He developed abd distension presumed ileus 8/29 and NGT was placed. He vomited early am 8/30 and developed resp distress and PCCM was called to evaluate and treat. He does not appear to need intubation at this time but it is a distinct possibility in near future.   PAST MEDICAL HISTORY :  He  has a past medical history of Angioedema of lips; Down's syndrome; Hypertension; Hypothyroidism; Mental disorder; Peripheral vascular disease ([place]); and Shortness of breath.  PAST SURGICAL HISTORY: He  has a past surgical history that includes Cholecystectomy; ORIF humerus fracture (Right, 06/07/2017); and Cystoscopy with retrograde pyelogram, ureteroscopy and stent placement (Right, 06/10/2017).  Allergies  Allergen Reactions  . Lisinopril Swelling    Angioedema of the tongue    No current facility-administered medications on file prior to encounter.    Current Outpatient Prescriptions on File Prior to Encounter  Medication Sig  . atenolol (TENORMIN) 100 MG tablet Take 100 mg by mouth daily.  . furosemide (LASIX) 80 MG tablet Take 0.5 tablets (40 mg total) by mouth daily. Resume in 2 days (Patient taking differently: Take 80 mg by mouth daily. )  . levothyroxine (SYNTHROID, LEVOTHROID) 200 MCG tablet Take 200 mcg by mouth daily. Takes along 63mcg to make a total 270mcg  . Potassium Chloride ER 20 MEQ TBCR Take 20 mEq by mouth 3 (three) times daily. (Patient not taking: Reported on 06/07/2017)    FAMILY HISTORY:  His has no family status  information on file.    SOCIAL HISTORY: He  reports that he has been smoking Cigarettes.  He has never used smokeless tobacco. He reports that he does not drink alcohol or use drugs.  REVIEW OF SYSTEMS:   NA  SUBJECTIVE:  Poorly responsive  VITAL SIGNS: BP (!) 144/57   Pulse 77   Temp 98.5 F (36.9 C) (Axillary)   Resp (!) 39   Ht 5\' 8"  (1.727 m)   Wt 294 lb 8.6 oz (133.6 kg)   SpO2 96%   BMI 44.78 kg/m   HEMODYNAMICS:    VENTILATOR SETTINGS:    INTAKE / OUTPUT: I/O last 3 completed shifts: In: 200 [IV Piggyback:200] Out: 900 [Urine:200; Emesis/NG output:700]  PHYSICAL EXAMINATION: General:  MO male poorly responsive Neuro:  Poorly responsive HEENT:  No neck, no jvd Cardiovascular:  HSr RRR Lungs:  Coarse rhonchi Abdomen:  Obese and distended  Musculoskeletal: rt shoulder site unremarkable  Skin:  Lower ext venous stasis   LABS:  BMET  Recent Labs Lab 06/28/17 1108 06/29/17 0559 06/30/17 0307  NA 141 145 146*  K 3.1* 3.6 3.3*  CL 109 113* 112*  CO2 21* 23 21*  BUN 41* 48* 59*  CREATININE 1.53* 1.53* 2.13*  GLUCOSE 167* 154* 179*    Electrolytes  Recent Labs Lab 06/27/17 0349 06/28/17 1108 06/29/17 0559 06/30/17 0307  CALCIUM 7.6* 8.2* 8.3* 8.2*  MG  --   --  2.3  --   PHOS 2.9  --   --   --     CBC  Recent Labs Lab 06/28/17 1108 06/29/17 0559 06/30/17 0307  WBC 18.4* 15.1* 27.3*  HGB 13.7 13.0 12.7*  HCT 40.9 39.7 38.0*  PLT 271 260 244    Coag's  Recent Labs Lab 06/05/2017 0945  APTT 34  INR 1.36    Sepsis Markers No results for input(s): LATICACIDVEN, PROCALCITON, O2SATVEN in the last 168 hours.  ABG  Recent Labs Lab 06/29/17 0140 06/30/17 0905  PHART 7.424 7.467*  PCO2ART 32.4 33.8  PO2ART 57.3* 96.7    Liver Enzymes  Recent Labs Lab 06/27/17 0349  ALBUMIN 2.3*    Cardiac Enzymes  Recent Labs Lab 06/18/2017 0353  TROPONINI <0.03    Glucose No results for input(s): GLUCAP in the last 168  hours.  Imaging Dg Abd 1 View  Result Date: 06/30/2017 CLINICAL DATA:  NG tube placement EXAM: ABDOMEN - 1 VIEW COMPARISON:  06/29/2017 FINDINGS: Patchy atelectasis or infiltrate at the right base. Interim placement of esophageal tube, tip projects over the gastric outlet. Surgical clips in the right upper quadrant. Dilated loops of small bowel again demonstrated in the upper abdomen. IMPRESSION: Esophageal tube tip overlies the distal stomach. Dilated loops of bowel re- demonstrated in the upper abdomen Electronically Signed   By: Donavan Foil M.D.   On: 06/30/2017 00:24   Dg Chest Port 1 View  Result Date: 06/30/2017 CLINICAL DATA:  Vomiting with aspiration pneumonia. EXAM: PORTABLE CHEST 1 VIEW COMPARISON:  Portable chest x-ray of June 29, 2017 FINDINGS: There has been interval blooming of the alveolar opacities on the left consistent with known aspiration. On the right the lung appears clear. The patient is rotated slightly toward the right. The cardiac silhouette remains enlarged. The pulmonary vascularity is not clearly engorged. The esophagogastric tube tip lies in the gastric cardia with the proximal port at approximately the level of the GE junction. IMPRESSION: Interval increase in alveolar opacity in the left mid and lower lung consistent with the suspected aspiration pneumonia. No definite right-sided pneumonia. Advancement of the nasogastric tube by 5-10 cm would assure that the proximal port remains below the GE junction. Electronically Signed   By: David  Martinique M.D.   On: 06/30/2017 08:50   Dg Abd Portable 1v  Result Date: 06/30/2017 CLINICAL DATA:  Vomiting EXAM: PORTABLE ABDOMEN - 1 VIEW COMPARISON:  06/29/2017 FINDINGS: NG tube has pulled back with the tip in the fundus of the stomach. Gaseous distention of bowel again noted which appears to be both large and small bowel. This may have worsened since prior study. IMPRESSION: Worsening gaseous distention of bowel which appears to be  both large and small bowel suggesting ileus. Electronically Signed   By: Rolm Baptise M.D.   On: 06/30/2017 08:48     STUDIES:    CULTURES: 8/29 bc x 2>> 8/29 uc 8/29 procal>>  ANTIBIOTICS: 8/29 zoysn>>  SIGNIFICANT EVENTS: 8/24 admitted after fall at home 8/24 rt shoulder replacement(Norris) 8/24 rt ureteral stone extraction and stent placement(Manny) 8/29 developed ileus 8/30 vomited and presumed to have aspirated  LINES/TUBES:   DISCUSSION: 62 mo wm with PMH of Downs syndrome , tobacco abuse, chronic lower ext venous statis who feel 8/24 and required reverse rt shoulder replacement and rt ureteral stone extraction and stent placement by Drs. Norris and [employer]. He developed abd distension presumed ileus 8/29 and NGT was placed. He vomited early am 8/30 and developed resp distress and PCCM was called to evaluate and treat. He does not appear to need intubation at this time  but it is a distinct possibility in near future.   ASSESSMENT / PLAN:  PULMONARY A: Resp distress in setting of presumed aspiration 8/30 MO(294 lbs) Tobacco abuse Bilateral lower lobe consolidation noted on ct abd. P:   O2 as needed Intubate if he is unable to protect his airway Decompress gastric contents Abx for asp pna. May need to add Vanc   CARDIOVASCULAR A:  PAF Chronic lower ext venous stasis  P:  Dilt per IM Consider 2d echo for lv function  RENAL Lab Results  Component Value Date   CREATININE 2.13 (H) 06/30/2017   CREATININE 1.53 (H) 06/29/2017   CREATININE 1.53 (H) 06/28/2017   CREATININE 0.99 11/28/2013    Recent Labs Lab 06/28/17 1108 06/29/17 0559 06/30/17 0307  K 3.1* 3.6 3.3*     Recent Labs Lab 06/28/17 1108 06/29/17 0559 06/30/17 0307  NA 141 145 146*    A:   ARI Hypokalemia Hypernatremia P:   Monitor renal function.  Had ureteral stent placed 8/24 per Dr. Tresa Moore afer stone extraction  GASTROINTESTINAL A:   Ileus per ct abd  8/29 Vomiting P:   NGT to [facility]IS CCS consult    HEMATOLOGIC  Recent Labs  06/29/17 0559 06/30/17 0307  HGB 13.0 12.7*    A:   DVT protection P:  DVT protection  INFECTIOUS A:   8/29 presumed aspiration P:   8/29 IV Zoysn May need Vanc as he was admitted 8/24  ENDOCRINE CBG (last 3)  No results for input(s): GLUCAP in the last 72 hours.   A:   Hypothyroidism  Hyperglycemia P:   Synthroid IV SSI coverage as needed  NEUROLOGIC A:   Downs syndrome Mental retardation Presumed encephalopathic from aspiration and acute illness  P:   RASS goal:1 Hold all sedating medications unless he gets intubated   Orthopedics  A.  rt shoulder replacement 8/24 Dr. Alma Friendly P. Per ortho    FAMILY  - Updates: No family at bedside 8/330/ RN spoke to father about possible need for intubation.  - Inter-disciplinary family meet or Palliative Care meeting due by:  day 7    App cct 65 min   Richardson Landry Linnet Bottari ACNP Maryanna Shape PCCM Pager [phone] till 3 pm If no answer page [phone] 06/30/2017, 10:06 AM

## 2017-06-30 NOTE — Progress Notes (Signed)
Patient had pulled his NG tube out during day shift. Unsuccessful attempt to place it back.   08/29 2000- Both RN and charge nurse had 4 unsuccessful attempts to place 16 Fr NG tube in.  08/30 0030-After giving patient prn Ativan, RN managed to place a 14 Fr NG tube in the rt nares. Xray showed the tip overlying the distal stomach. RN consulted with Chaney Malling who was in the unit. She pulled the tube back and we placed the NG tube to low intermittent suction. Scant output (1-5ml) noted.  Abdomen is still distended but not hard as the previous night per Belenda Cruise. Rhonchi auscultated in both lung fields. Pt. still continues to be on the NRBR at 15L. Will continue to monitor.

## 2017-06-30 NOTE — Progress Notes (Signed)
[facility] Progress Note Patient Name: Eduardo Herman DOB: August 29, 1951 MRN: [id number]   Date of Service  06/30/2017  HPI/Events of Note  Portable chest x-ray reviewed showing slight worsening of bilateral lower lung opacities. ABG reviewed showing no change in ventilation or oxygenation. Camera shows respiratory rate, saturation, and level of alertness unchanged.   eICU Interventions  Continue close monitoring given potential for endotracheal intubation.      Intervention Category Major Interventions: Respiratory failure - evaluation and management  Tera Partridge 06/30/2017, 6:49 PM

## 2017-07-01 ENCOUNTER — Other Ambulatory Visit (HOSPITAL_COMMUNITY): Payer: Medicare Other

## 2017-07-01 ENCOUNTER — Inpatient Hospital Stay (HOSPITAL_COMMUNITY): Payer: Medicare Other

## 2017-07-01 LAB — URINALYSIS, ROUTINE W REFLEX MICROSCOPIC
Bilirubin Urine: NEGATIVE
GLUCOSE, UA: NEGATIVE mg/dL
Ketones, ur: NEGATIVE mg/dL
Nitrite: NEGATIVE
Protein, ur: 30 mg/dL — AB
SPECIFIC GRAVITY, URINE: 1.02 (ref 1.005–1.030)
pH: 5 (ref 5.0–8.0)

## 2017-07-01 LAB — CBC
HEMATOCRIT: 34.6 % — AB (ref 39.0–52.0)
Hemoglobin: 11.2 g/dL — ABNORMAL LOW (ref 13.0–17.0)
MCH: 28.8 pg (ref 26.0–34.0)
MCHC: 32.4 g/dL (ref 30.0–36.0)
MCV: 88.9 fL (ref 78.0–100.0)
Platelets: 282 10*3/uL (ref 150–400)
RBC: 3.89 MIL/uL — ABNORMAL LOW (ref 4.22–5.81)
RDW: 17 % — ABNORMAL HIGH (ref 11.5–15.5)
WBC: 27.5 10*3/uL — AB (ref 4.0–10.5)

## 2017-07-01 LAB — BASIC METABOLIC PANEL
ANION GAP: 10 (ref 5–15)
ANION GAP: 9 (ref 5–15)
Anion gap: 8 (ref 5–15)
BUN: 69 mg/dL — AB (ref 6–20)
BUN: 66 mg/dL — AB (ref 6–20)
BUN: 60 mg/dL — AB (ref 6–20)
CHLORIDE: 118 mmol/L — AB (ref 101–111)
CHLORIDE: 118 mmol/L — AB (ref 101–111)
CO2: 27 mmol/L (ref 22–32)
CO2: 30 mmol/L (ref 22–32)
CO2: 30 mmol/L (ref 22–32)
Calcium: 7.5 mg/dL — ABNORMAL LOW (ref 8.9–10.3)
Calcium: 7.6 mg/dL — ABNORMAL LOW (ref 8.9–10.3)
Calcium: 7.6 mg/dL — ABNORMAL LOW (ref 8.9–10.3)
Chloride: 117 mmol/L — ABNORMAL HIGH (ref 101–111)
Creatinine, Ser: 2.12 mg/dL — ABNORMAL HIGH (ref 0.61–1.24)
Creatinine, Ser: 1.92 mg/dL — ABNORMAL HIGH (ref 0.61–1.24)
Creatinine, Ser: 1.76 mg/dL — ABNORMAL HIGH (ref 0.61–1.24)
GFR calc Af Amer: 35 mL/min — ABNORMAL LOW (ref >60)
GFR calc Af Amer: 40 mL/min — ABNORMAL LOW (ref >60)
GFR calc Af Amer: 44 mL/min — ABNORMAL LOW (ref >60)
GFR calc non Af Amer: 31 mL/min — ABNORMAL LOW (ref >60)
GFR calc non Af Amer: 34 mL/min — ABNORMAL LOW (ref >60)
GFR calc non Af Amer: 38 mL/min — ABNORMAL LOW (ref >60)
GLUCOSE: 164 mg/dL — AB (ref 65–99)
Glucose, Bld: 202 mg/dL — ABNORMAL HIGH (ref 65–99)
Glucose, Bld: 171 mg/dL — ABNORMAL HIGH (ref 65–99)
POTASSIUM: 3.1 mmol/L — AB (ref 3.5–5.1)
POTASSIUM: 2.7 mmol/L — AB (ref 3.5–5.1)
POTASSIUM: 2.6 mmol/L — AB (ref 3.5–5.1)
SODIUM: 156 mmol/L — AB (ref 135–145)
SODIUM: 157 mmol/L — AB (ref 135–145)
Sodium: 154 mmol/L — ABNORMAL HIGH (ref 135–145)

## 2017-07-01 LAB — MAGNESIUM: MAGNESIUM: 2.9 mg/dL — AB (ref 1.7–2.4)

## 2017-07-01 MED ORDER — BISACODYL 10 MG RE SUPP
10.0000 mg | Freq: Two times a day (BID) | RECTAL | Status: DC
  Administered 2017-07-02: 10 mg via RECTAL
  Filled 2017-07-01: qty 1

## 2017-07-01 MED ORDER — POTASSIUM CHLORIDE 10 MEQ/100ML IV SOLN
10.0000 meq | INTRAVENOUS | Status: AC
  Administered 2017-07-01 (×4): 10 meq via INTRAVENOUS
  Filled 2017-07-01 (×4): qty 100

## 2017-07-01 MED ORDER — DEXTROSE-NACL 5-0.45 % IV SOLN
INTRAVENOUS | Status: DC
  Administered 2017-07-01: 08:00:00 via INTRAVENOUS

## 2017-07-01 MED ORDER — SODIUM CHLORIDE 0.9 % IV BOLUS (SEPSIS)
500.0000 mL | Freq: Once | INTRAVENOUS | Status: AC
  Administered 2017-07-01: 500 mL via INTRAVENOUS

## 2017-07-01 MED ORDER — ORAL CARE MOUTH RINSE
15.0000 mL | Freq: Two times a day (BID) | OROMUCOSAL | Status: DC
  Administered 2017-07-01: 15 mL via OROMUCOSAL

## 2017-07-01 MED ORDER — DEXTROSE 5 % IV SOLN
INTRAVENOUS | Status: DC
  Administered 2017-07-01 – 2017-07-02 (×2): via INTRAVENOUS

## 2017-07-01 MED ORDER — CHLORHEXIDINE GLUCONATE 0.12 % MT SOLN
15.0000 mL | Freq: Two times a day (BID) | OROMUCOSAL | Status: DC
  Administered 2017-07-01 – 2017-07-02 (×2): 15 mL via OROMUCOSAL
  Filled 2017-07-01: qty 15

## 2017-07-01 MED ORDER — KETOROLAC TROMETHAMINE 15 MG/ML IJ SOLN
15.0000 mg | Freq: Once | INTRAMUSCULAR | Status: AC
  Administered 2017-07-01: 15 mg via INTRAVENOUS
  Filled 2017-07-01: qty 1

## 2017-07-01 NOTE — Progress Notes (Signed)
PROGRESS NOTE    Eduardo Herman  IRC:[id number] DOB: [birth year] DOA: 06/23/2017 PCP: Leonard Downing, MD    Brief Narrative:  66 year old male with medical history significant of Down syndrome, MR, hypothyroidism, chronic kidney disease stage III chronic venous insufficiency who presented to the emergency department after mechanical fall and right shoulder pain. Patient was found to have R shoulder fracture orthopedic surgery was consulted. CT of the abdomen was done due to abdominal pain she revealed a 5 mm kidney stone with possible pyelonephritis. Patient was started on IV abx, flomax and urology was consulted. Patient now is status post right shoulder arthroplasty and cystoscopy with right thorascopic with stent placement.   Assessment & Plan:   Principal Problem:   Fall Active Problems:   Mental retardation   Essential hypertension, benign   CKD (chronic kidney disease), stage III   Hypokalemia   Hypothyroidism   Tobacco abuse   Fracture of humerus anatomical neck, right, closed, initial encounter   Abdominal distension   Abdominal pain   S/P shoulder replacement, right   Hypoxia  Fracture of right humerus Pt is status post mechanical fall Status post right shoulder arthroplasty on 06/29/2017 Thus far remains stable  Right ureteral stone with possible pyelonephritis Status post cystoscopy with right ureteroscopy and stent placement Continue antibiotics, urine culture grew Proteus mirabilis pansensitive  Initially treated with Rocephin, this was discussed related to Ancef on 8/27 Pt has not been compliant with incentive spirometry Pt had been continued on Flomax as tolerated  Acute kidney injury on Chronic kidney disease stage III - creatinine improving Baseline creatinine on 2016 was 1.79, although patient on admission was found to have creatinine of 2.13 creatinine has responded to IV fluids which meets criteria for acute renal failure. Cr improving with IVF  hydration Repeat bmet in AM  Hypokalemia - resolved Persistently low Ordered and reviewed Mg Will continue to replace as needed Repeat BMET in AM  Hypertension Blood pressure continues to be stable during hospital stay Initially continued on atenolol and Cardizem Given recent events, patient remains on nonrebreather. Pt now on atenolol to IV beta blocker with hold parameters. Cardizem on hold until patient able to reliably tolerate by mouth.  Leukocytosis secondary to Ileus and aspiration PNA WBC trending up overnight, worsening Have ordered and reviewed CXR and abd xray. Findings of worse ileus and worsened PNA Continued on zosyn and added vanc  Down syndrome with MR Stable, father legal guardian  Ileus Overnight events noted. Patient noted to have increase abdominal distention with nausea, vomiting, concerns for likely aspiration. Abd appears more distended Repeat abd xray with findings of worsened ileus General surgery following. Persistent ileus. Recommendation for continued bowel regimen  Aspiration Pneumonia Recent events noted. Patient with episode of vomiting, increased O2 requirements, imaging studies suggesting likely aspiration. Patient started on Zosyn. Patient remains on nonrebreather. Remains with tachypnea PCCM continues to follow. Recommendations to cont O2 wean as tolerated. ABG reviewed. Low threshold for intubation if airway lost.  DVT prophylaxis:  SCDs Code Status:  Full code Family Communication:  Patient in room, family not at bedside Disposition Plan:  Uncertain at this time  Consultants:   Orthopedic surgery  Urology  PCCM  General Surgery  Procedures:     Antimicrobials: Anti-infectives    Start     Dose/Rate Route Frequency Ordered Stop   07/01/17 1400  vancomycin (VANCOCIN) 1,500 mg in sodium chloride 0.9 % 500 mL IVPB     1,500 mg 250 mL/hr over  120 Minutes Intravenous Every 24 hours 06/30/17 1313     06/30/17 1315   vancomycin (VANCOCIN) 2,000 mg in sodium chloride 0.9 % 500 mL IVPB     2,000 mg 250 mL/hr over 120 Minutes Intravenous  Once 06/30/17 1305 06/30/17 1713   06/29/17 0415  piperacillin-tazobactam (ZOSYN) IVPB 3.375 g     3.375 g 12.5 mL/hr over 240 Minutes Intravenous Every 8 hours 06/29/17 0400     06/28/17 1300  ceFAZolin (ANCEF) IVPB 1 g/50 mL premix  Status:  Discontinued     1 g 100 mL/hr over 30 Minutes Intravenous Every 8 hours 06/27/17 1858 06/29/17 0400   06/27/2017 2000  ceFAZolin (ANCEF) IVPB 2g/100 mL premix  Status:  Discontinued     2 g 200 mL/hr over 30 Minutes Intravenous Every 6 hours 06/21/2017 1824 06/10/2017 1835   06/12/2017 1316  ceFAZolin (ANCEF) 3 g in dextrose 5 % 50 mL IVPB     3 g 130 mL/hr over 30 Minutes Intravenous 30 min pre-op 06/22/2017 1316 06/16/2017 1359   06/06/2017 1400  cefTRIAXone (ROCEPHIN) 1 g in dextrose 5 % 50 mL IVPB  Status:  Discontinued     1 g 100 mL/hr over 30 Minutes Intravenous Every 24 hours 06/08/2017 1334 06/27/17 1838      Subjective: Cannot assess  Objective: Vitals:   07/01/17 1200 07/01/17 1300 07/01/17 1400 07/01/17 1500  BP: (!) 166/69 (!) 170/73 (!) 160/70 (!) 170/62  Pulse: 74 74 77 75  Resp: (!) 26 (!) 33 (!) 31 (!) 30  Temp: (!) 100.6 F (38.1 C) (!) 100.4 F (38 C) (!) 100.6 F (38.1 C) (!) 100.9 F (38.3 C)  TempSrc: Core (Comment)     SpO2: 92% 96% 93% 94%  Weight:      Height:        Intake/Output Summary (Last 24 hours) at 07/01/17 1559 Last data filed at 07/01/17 1500  Gross per 24 hour  Intake          3786.25 ml  Output             4025 ml  Net          -238.75 ml   Filed Weights   06/23/2017 0110 06/29/17 0355  Weight: 134.9 kg (297 lb 6.4 oz) 133.6 kg (294 lb 8.6 oz)    Examination: General exam: Not conversant, in mild resp distress Respiratory system: normal chest rise, coarse breath sounds Cardiovascular system: regular rhythm, s1-s2 Gastrointestinal system: Nondistended, nontender, pos BS Central  nervous system: No seizures, no tremors Extremities: No cyanosis, no joint deformities Skin: No rashes, no pallor Psychiatry: Unable to assess given acute illness  Data Reviewed: I have personally reviewed following labs and imaging studies  CBC:  Recent Labs Lab 06/27/2017 0505 06/26/17 0523 06/27/17 0349 06/28/17 1108 06/29/17 0559 06/30/17 0307 07/01/17 0250  WBC 10.5 15.2* 16.0* 18.4* 15.1* 27.3* 27.5*  NEUTROABS 7.9* 13.6* 13.3*  --  12.4*  --   --   HGB 13.2 11.7* 10.9* 13.7 13.0 12.7* 11.2*  HCT 39.9 34.8* 33.3* 40.9 39.7 38.0* 34.6*  MCV 88.1 87.2 88.6 87.0 86.9 87.8 88.9  PLT 175 180 199 271 260 244 323   Basic Metabolic Panel:  Recent Labs Lab 06/27/17 0349 06/28/17 1108 06/29/17 0559 06/30/17 0307 07/01/17 0250 07/01/17 1126  NA 140 141 145 146* 154* 156*  K 3.6 3.1* 3.6 3.3* 3.1* 2.7*  CL 112* 109 113* 112* 117* 118*  CO2 20* 21* 23 21*  27 30  GLUCOSE 158* 167* 154* 179* 164* 202*  BUN 48* 41* 48* 59* 69* 66*  CREATININE 1.84* 1.53* 1.53* 2.13* 2.12* 1.92*  CALCIUM 7.6* 8.2* 8.3* 8.2* 7.5* 7.6*  MG  --   --  2.3  --   --  2.9*  PHOS 2.9  --   --   --   --   --    GFR: Estimated Creatinine Clearance: 49.9 mL/min (A) (by C-G formula based on SCr of 1.92 mg/dL (H)). Liver Function Tests:  Recent Labs Lab 06/27/17 0349  ALBUMIN 2.3*   No results for input(s): LIPASE, AMYLASE in the last 168 hours. No results for input(s): AMMONIA in the last 168 hours. Coagulation Profile: No results for input(s): INR, PROTIME in the last 168 hours. Cardiac Enzymes: No results for input(s): CKTOTAL, CKMB, CKMBINDEX, TROPONINI in the last 168 hours. BNP (last 3 results) No results for input(s): PROBNP in the last 8760 hours. HbA1C: No results for input(s): HGBA1C in the last 72 hours. CBG: No results for input(s): GLUCAP in the last 168 hours. Lipid Profile: No results for input(s): CHOL, HDL, LDLCALC, TRIG, CHOLHDL, LDLDIRECT in the last 72 hours. Thyroid  Function Tests: No results for input(s): TSH, T4TOTAL, FREET4, T3FREE, THYROIDAB in the last 72 hours. Anemia Panel: No results for input(s): VITAMINB12, FOLATE, FERRITIN, TIBC, IRON, RETICCTPCT in the last 72 hours. Sepsis Labs: No results for input(s): PROCALCITON, LATICACIDVEN in the last 168 hours.  Recent Results (from the past 240 hour(s))  Culture, Urine     Status: Abnormal   Collection Time: 06/04/2017  3:40 AM  Result Value Ref Range Status   Specimen Description URINE, RANDOM  Final   Special Requests NONE  Final   Culture 50,000 COLONIES/mL PROTEUS MIRABILIS (A)  Final   Report Status 06/27/2017 FINAL  Final   Organism ID, Bacteria PROTEUS MIRABILIS (A)  Final      Susceptibility   Proteus mirabilis - MIC*    AMPICILLIN <=2 SENSITIVE Sensitive     CEFAZOLIN <=4 SENSITIVE Sensitive     CEFTRIAXONE <=1 SENSITIVE Sensitive     CIPROFLOXACIN <=0.25 SENSITIVE Sensitive     GENTAMICIN <=1 SENSITIVE Sensitive     IMIPENEM 0.5 SENSITIVE Sensitive     NITROFURANTOIN >=512 RESISTANT Resistant     TRIMETH/SULFA <=20 SENSITIVE Sensitive     AMPICILLIN/SULBACTAM <=2 SENSITIVE Sensitive     PIP/TAZO <=4 SENSITIVE Sensitive     * 50,000 COLONIES/mL PROTEUS MIRABILIS  Surgical pcr screen     Status: None   Collection Time: 06/04/2017  8:44 PM  Result Value Ref Range Status   MRSA, PCR NEGATIVE NEGATIVE Final   Staphylococcus aureus NEGATIVE NEGATIVE Final    Comment:        The Xpert SA Assay (FDA approved for NASAL specimens in patients over 22 years of age), is one component of a comprehensive surveillance program.  Test performance has been validated by [facility] for patients greater than or equal to 85 year old. It is not intended to diagnose infection nor to guide or monitor treatment.   Culture, Urine     Status: Abnormal   Collection Time: 06/15/2017 12:36 AM  Result Value Ref Range Status   Specimen Description URINE, CATHETERIZED  Final   Special Requests NONE   Final   Culture (A)  Final    <10,000 COLONIES/mL Performed at [facility], [place] [address], [place], [place] [address]    Report Status 06/26/2017 FINAL  Final  Culture, blood (Routine X 2) w Reflex to ID Panel     Status: None (Preliminary result)   Collection Time: 06/28/17  5:07 PM  Result Value Ref Range Status   Specimen Description BLOOD LEFT ANTECUBITAL  Final   Special Requests IN PEDIATRIC BOTTLE Blood Culture adequate volume  Final   Culture   Final    NO GROWTH 3 DAYS Performed at [facility], [place] [address], [place], [place] [address]    Report Status PENDING  Incomplete  Culture, blood (Routine X 2) w Reflex to ID Panel     Status: None (Preliminary result)   Collection Time: 06/28/17  5:21 PM  Result Value Ref Range Status   Specimen Description BLOOD LEFT HAND  Final   Special Requests   Final    BOTTLES DRAWN AEROBIC ONLY Blood Culture adequate volume   Culture   Final    NO GROWTH 3 DAYS Performed at [facility], [place] [address], [place], [place] [address]    Report Status PENDING  Incomplete  MRSA PCR Screening     Status: None   Collection Time: 06/29/17  8:00 AM  Result Value Ref Range Status   MRSA by PCR NEGATIVE NEGATIVE Final    Comment:        The GeneXpert MRSA Assay (FDA approved for NASAL specimens only), is one component of a comprehensive MRSA colonization surveillance program. It is not intended to diagnose MRSA infection nor to guide or monitor treatment for MRSA infections.      Radiology Studies: Dg Abd 1 View  Result Date: 06/30/2017 CLINICAL DATA:  NG tube placement EXAM: ABDOMEN - 1 VIEW COMPARISON:  06/29/2017 FINDINGS: Patchy atelectasis or infiltrate at the right base. Interim placement of esophageal tube, tip projects over the gastric outlet. Surgical clips in the right upper quadrant. Dilated loops of small bowel again demonstrated in the upper abdomen. IMPRESSION: Esophageal tube tip overlies the distal  stomach. Dilated loops of bowel re- demonstrated in the upper abdomen Electronically Signed   By: Donavan Foil M.D.   On: 06/30/2017 00:24   US Renal  Result Date: 07/01/2017 CLINICAL DATA:  Elevated creatinine. EXAM: RENAL / URINARY TRACT ULTRASOUND COMPLETE COMPARISON:  CT abdomen pelvis dated June 29, 2017. FINDINGS: Right Kidney: Length: 13.8 cm. Increased echogenicity. Two simple appearing cysts are noted, the largest measuring up to 2.3 cm. No mass or hydronephrosis visualized. Left Kidney: Length: 14.4 cm. Increased echogenicity. No mass or hydronephrosis visualized. Bladder: Appears normal for degree of bladder distention. IMPRESSION: Increased echogenicity of the bilateral kidneys, as can be seen with medical renal disease. No hydronephrosis. Electronically Signed   By: Titus Dubin M.D.   On: 07/01/2017 15:01   Dg Chest Port 1 View  Result Date: 07/01/2017 CLINICAL DATA:  Respiratory failure. EXAM: PORTABLE CHEST 1 VIEW COMPARISON:  06/30/2017 . FINDINGS: NG tube noted with tip below left hemidiaphragm. Cardiomegaly with pulmonary venous congestion. Bilateral infiltrates/edema with slight worsening from prior exam. Basilar atelectasis. Tiny bilateral pleural effusions cannot be excluded. Right shoulder replacement. Surgical staples over the right chest. IMPRESSION: 1.  NG tube noted with its tip below left hemidiaphragm. 2. Cardiomegaly with bilateral pulmonary infiltrates/edema with slight worsening from prior exam. Small bilateral pleural effusions. Findings suggest CHF. Bibasilar pneumonia cannot be excluded. Electronically Signed   By: Marcello Moores  Register   On: 07/01/2017 07:01   Dg Chest Port 1 View  Result Date: 06/30/2017 CLINICAL DATA:  Follow-up pneumonia.  EXAM: PORTABLE CHEST 1 VIEW 6:04 p.m.: COMPARISON:  Portable chest x-ray earlier today 8:14 a.m., 06/29/2017 and earlier. FINDINGS: Suboptimal inspiration. Cardiac silhouette mildly enlarged, unchanged. Airspace consolidation  involving the left lung, unchanged. Dense consolidation in the right lower lobe, worse than earlier today. Lungs otherwise clear. Pulmonary vascularity normal without evidence of pulmonary edema. Nasogastric tube tip projects over the expected location of the gastric fundus. IMPRESSION: 1. Stable left lung pneumonia. 2. Worsening atelectasis and/or pneumonia involving the right lower lobe since earlier today. 3. Mild cardiomegaly without pulmonary edema. Electronically Signed   By: Evangeline Dakin M.D.   On: 06/30/2017 18:19   Dg Chest Port 1 View  Result Date: 06/30/2017 CLINICAL DATA:  Vomiting with aspiration pneumonia. EXAM: PORTABLE CHEST 1 VIEW COMPARISON:  Portable chest x-ray of June 29, 2017 FINDINGS: There has been interval blooming of the alveolar opacities on the left consistent with known aspiration. On the right the lung appears clear. The patient is rotated slightly toward the right. The cardiac silhouette remains enlarged. The pulmonary vascularity is not clearly engorged. The esophagogastric tube tip lies in the gastric cardia with the proximal port at approximately the level of the GE junction. IMPRESSION: Interval increase in alveolar opacity in the left mid and lower lung consistent with the suspected aspiration pneumonia. No definite right-sided pneumonia. Advancement of the nasogastric tube by 5-10 cm would assure that the proximal port remains below the GE junction. Electronically Signed   By: David  Martinique M.D.   On: 06/30/2017 08:50   Dg Abd Portable 1v  Result Date: 07/01/2017 CLINICAL DATA:  Ileus EXAM: PORTABLE ABDOMEN - 1 VIEW COMPARISON:  Yesterday FINDINGS: Diffuse gaseous distension of small and large bowel consistent with history of ileus. No noted progression since prior. No concerning mass effect or gas collection. Cholecystectomy clips. Nasogastric tube tip is not covered on this study, but is seen on the abdominal radiograph. IMPRESSION: Unchanged ileus pattern.  Electronically Signed   By: Monte Fantasia M.D.   On: 07/01/2017 07:07   Dg Abd Portable 1v  Result Date: 06/30/2017 CLINICAL DATA:  Vomiting EXAM: PORTABLE ABDOMEN - 1 VIEW COMPARISON:  06/29/2017 FINDINGS: NG tube has pulled back with the tip in the fundus of the stomach. Gaseous distention of bowel again noted which appears to be both large and small bowel. This may have worsened since prior study. IMPRESSION: Worsening gaseous distention of bowel which appears to be both large and small bowel suggesting ileus. Electronically Signed   By: Rolm Baptise M.D.   On: 06/30/2017 08:48    Scheduled Meds: . bisacodyl  10 mg Rectal BID  . levothyroxine  112 mcg Intravenous Daily  . metoprolol tartrate  5 mg Intravenous Q6H   Continuous Infusions: . dextrose    . methocarbamol (ROBAXIN)  IV    . piperacillin-tazobactam (ZOSYN)  IV 3.375 g (07/01/17 1500)  . potassium chloride    . vancomycin 1,500 mg (07/01/17 1500)     LOS: 7 days   CHIU, Orpah Melter, MD [facility] Pager [phone]  If 7PM-7AM, please contact night-coverage [email or web address] Password [email or web address] 07/01/2017, 3:59 PM

## 2017-07-01 NOTE — Progress Notes (Signed)
PULMONARY / CRITICAL CARE MEDICINE   Name: Eduardo Herman MRN: [id number] DOB: May 25, 1951    ADMISSION DATE:  06/08/2017 CONSULTATION DATE: 8/30  REFERRING MD:  [facility]  CHIEF COMPLAINT:  Aspiration  HISTORY OF PRESENT ILLNESS:   47 mo wm with PMH of Downs syndrome , tobacco abuse, chronic lower ext venous statis who feel 8/24 and required reverse rt shoulder replacement and rt ureteral stone extraction and stent placement by Drs. Norris and [employer]. He developed abd distension presumed ileus 8/29 and NGT was placed. He vomited early am 8/30 and developed resp distress and PCCM was called to evaluate and treat. He does not appear to need intubation at this time but it is a distinct possibility in near future.    SUBJECTIVE:  Poorly responsive  VITAL SIGNS: BP (!) 166/68   Pulse 75   Temp (!) 100.4 F (38 C)   Resp (!) 39   Ht 5\' 8"  (1.727 m)   Wt 294 lb 8.6 oz (133.6 kg)   SpO2 96%   BMI 44.78 kg/m   HEMODYNAMICS:    VENTILATOR SETTINGS: FiO2 (%):  [100 %] 100 %  INTAKE / OUTPUT: I/O last 3 completed shifts: In: 3123.8 [I.V.:1723.8; Other:600; IV Piggyback:800] Out: 7829 [Urine:1250; Emesis/NG output:1800]  PHYSICAL EXAMINATION: General:  Mo male nrb HEENT: dry oral mucosa  FAO:ZHYQMVHQI to arouse Neuro: no follows commands but does arouse to stimuli CV: HSR RRR PULM: Abd/cw paradoxus at times, gurgling at times, coarse rhonchi bilaterally ON:GEXB, non-tender, bsx4 active  Extremities: warm/dry, ++ edema  Skin: lower ext with bilat venous stasis    LABS:  BMET  Recent Labs Lab 06/29/17 0559 06/30/17 0307 07/01/17 0250  NA 145 146* 154*  K 3.6 3.3* 3.1*  CL 113* 112* 117*  CO2 23 21* 27  BUN 48* 59* 69*  CREATININE 1.53* 2.13* 2.12*  GLUCOSE 154* 179* 164*    Electrolytes  Recent Labs Lab 06/27/17 0349  06/29/17 0559 06/30/17 0307 07/01/17 0250  CALCIUM 7.6*  < > 8.3* 8.2* 7.5*  MG  --   --  2.3  --   --   PHOS 2.9  --   --   --    --   < > = values in this interval not displayed.  CBC  Recent Labs Lab 06/29/17 0559 06/30/17 0307 07/01/17 0250  WBC 15.1* 27.3* 27.5*  HGB 13.0 12.7* 11.2*  HCT 39.7 38.0* 34.6*  PLT 260 244 282    Coag's No results for input(s): APTT, INR in the last 168 hours.  Sepsis Markers No results for input(s): LATICACIDVEN, PROCALCITON, O2SATVEN in the last 168 hours.  ABG  Recent Labs Lab 06/29/17 0140 06/30/17 0905 06/30/17 1830  PHART 7.424 7.467* 7.489*  PCO2ART 32.4 33.8 35.2  PO2ART 57.3* 96.7 94.5    Liver Enzymes  Recent Labs Lab 06/27/17 0349  ALBUMIN 2.3*    Cardiac Enzymes No results for input(s): TROPONINI, PROBNP in the last 168 hours.  Glucose No results for input(s): GLUCAP in the last 168 hours.  Imaging Dg Chest Port 1 View  Result Date: 07/01/2017 CLINICAL DATA:  Respiratory failure. EXAM: PORTABLE CHEST 1 VIEW COMPARISON:  06/30/2017 . FINDINGS: NG tube noted with tip below left hemidiaphragm. Cardiomegaly with pulmonary venous congestion. Bilateral infiltrates/edema with slight worsening from prior exam. Basilar atelectasis. Tiny bilateral pleural effusions cannot be excluded. Right shoulder replacement. Surgical staples over the right chest. IMPRESSION: 1.  NG tube noted with its tip below left hemidiaphragm.  2. Cardiomegaly with bilateral pulmonary infiltrates/edema with slight worsening from prior exam. Small bilateral pleural effusions. Findings suggest CHF. Bibasilar pneumonia cannot be excluded. Electronically Signed   By: Marcello Moores  Register   On: 07/01/2017 07:01   Dg Chest Port 1 View  Result Date: 06/30/2017 CLINICAL DATA:  Follow-up pneumonia. EXAM: PORTABLE CHEST 1 VIEW 6:04 p.m.: COMPARISON:  Portable chest x-ray earlier today 8:14 a.m., 06/29/2017 and earlier. FINDINGS: Suboptimal inspiration. Cardiac silhouette mildly enlarged, unchanged. Airspace consolidation involving the left lung, unchanged. Dense consolidation in the right lower  lobe, worse than earlier today. Lungs otherwise clear. Pulmonary vascularity normal without evidence of pulmonary edema. Nasogastric tube tip projects over the expected location of the gastric fundus. IMPRESSION: 1. Stable left lung pneumonia. 2. Worsening atelectasis and/or pneumonia involving the right lower lobe since earlier today. 3. Mild cardiomegaly without pulmonary edema. Electronically Signed   By: Evangeline Dakin M.D.   On: 06/30/2017 18:19   Dg Abd Portable 1v  Result Date: 07/01/2017 CLINICAL DATA:  Ileus EXAM: PORTABLE ABDOMEN - 1 VIEW COMPARISON:  Yesterday FINDINGS: Diffuse gaseous distension of small and large bowel consistent with history of ileus. No noted progression since prior. No concerning mass effect or gas collection. Cholecystectomy clips. Nasogastric tube tip is not covered on this study, but is seen on the abdominal radiograph. IMPRESSION: Unchanged ileus pattern. Electronically Signed   By: Monte Fantasia M.D.   On: 07/01/2017 07:07     STUDIES:  8/31 renal us>> 8/31 2 d echo>>  CULTURES: 8/29 bc x 2>> 8/29 uc 8/29 procal>>  ANTIBIOTICS: 8/29 zoysn>>  SIGNIFICANT EVENTS: 8/24 admitted after fall at home 8/24 rt shoulder replacement(Norris) 8/24 rt ureteral stone extraction and stent placement(Manny) 8/29 developed ileus 8/30 vomited and presumed to have aspirated 8/31 creatine rising  LINES/TUBES:   DISCUSSION: 69 mo wm with PMH of Downs syndrome , tobacco abuse, chronic lower ext venous statis who feel 8/24 and required reverse rt shoulder replacement and rt ureteral stone extraction and stent placement by Drs. Norris and [employer]. He developed abd distension presumed ileus 8/29 and NGT was placed. He vomited early am 8/30 and developed resp distress and PCCM was called to evaluate and treat. He does not appear to need intubation at this time but it is a distinct possibility in near future.   ASSESSMENT / PLAN:  PULMONARY A: Resp  distress in setting of presumed aspiration 8/30 MO(294 lbs) Tobacco abuse Bilateral lower lobe consolidation noted on ct abd. Non compliant with Cpap for OSA P:   O2 as needed Intubate if he is unable to protect his airway Decompress gastric contents Abx for asp pna. added Vanc 8/30 (note: if intubated then trach and peg will most likely be needed) he has MO, OSA, Downs syndrome.  CARDIOVASCULAR A:  PAF Chronic lower ext venous stasis  P:  Dilt per IM 2d echo for lv function ordered  RENAL Lab Results  Component Value Date   CREATININE 2.12 (H) 07/01/2017   CREATININE 2.13 (H) 06/30/2017   CREATININE 1.53 (H) 06/29/2017   CREATININE 0.99 11/28/2013    Recent Labs Lab 06/29/17 0559 06/30/17 0307 07/01/17 0250  K 3.6 3.3* 3.1*     Recent Labs Lab 06/29/17 0559 06/30/17 0307 07/01/17 0250  NA 145 146* 154*    A:   ARI, worsening 8/31, note rt ureteral stent per Dr. Tresa Moore  Hypokalemia Hypernatremia P:   Monitor renal function.  Had ureteral stent placed 8/24 per Dr. Tresa Moore afer stone  extraction IVF d5.45 saline ? Renal consult 8/31 renal US ro obstruction  GASTROINTESTINAL A:   Ileus per ct abd 8/29 Vomiting GIB P:   NGT to LWIS CCS consult    HEMATOLOGIC  Recent Labs  06/30/17 0307 07/01/17 0250  HGB 12.7* 11.2*    A:   DVT protection P:  DVT protection with pas  INFECTIOUS A:   8/29 presumed aspiration P:   8/29 IV Zoysn May need Vanc as he was admitted 8/24  ENDOCRINE CBG (last 3)  No results for input(s): GLUCAP in the last 72 hours.   A:   Hypothyroidism  Hyperglycemia P:   Synthroid IV SSI coverage as needed  NEUROLOGIC A:   Downs syndrome Mental retardation Presumed encephalopathic from aspiration and acute illness  P:   RASS goal:1 Hold all sedating medications unless he gets intubated   Orthopedics  A.  rt shoulder replacement 8/24 Dr. Alma Friendly P. Per ortho, Dr. Veverly Fells input noted.    FAMILY  -  Updates: Family updated 8/30.  - Inter-disciplinary family meet or Palliative Care meeting due by:  day 7    App cct 40 min   Richardson Landry Minor ACNP Maryanna Shape PCCM Pager [phone] till 3 pm If no answer page [phone] 07/01/2017, 9:49 AM

## 2017-07-01 NOTE — Progress Notes (Signed)
Assessment  Ileus-x ray unchanged; abdomen softer and a little less distended    Fall with fracture of humerus anatomical neck, right S/P shoulder replacement, right   Hypokalemia-replacement ordered   Aspiration PNA    Mental retardation      Plan:  Continue ng tube.  Needs to ambulate when medically able. Do not need to repeat x-rays unless he has abdominal pain or increased distension.  Dulcolax supp   LOS: 7 days     6 Days Post-Op  Chief Complaint/Subjective: He denies abdominal pain.  Objective: Vital signs in last 24 hours: Temp:  [98.5 F (36.9 C)-101.7 F (38.7 C)] 100.4 F (38 C) (08/31 0700) Pulse Rate:  [72-85] 75 (08/31 0700) Resp:  [29-43] 39 (08/31 0700) BP: (133-168)/(49-92) 161/77 (08/31 0700) SpO2:  [88 %-97 %] 96 % (08/31 0700) Last BM Date: 06/30/17  Intake/Output from previous day: 08/30 0701 - 08/31 0700 In: 3023.8 [I.V.:1723.8; IV Piggyback:700] Out: 2850 [Urine:1050; Emesis/NG output:1800] Intake/Output this shift: No intake/output data recorded.  PE: General- In NAD.  Awake and alert. Abdomen-softer and a little less distended, no bowel sounds  Lab Results:   Recent Labs  06/30/17 0307 07/01/17 0250  WBC 27.3* 27.5*  HGB 12.7* 11.2*  HCT 38.0* 34.6*  PLT 244 282   BMET  Recent Labs  06/30/17 0307 07/01/17 0250  NA 146* 154*  K 3.3* 3.1*  CL 112* 117*  CO2 21* 27  GLUCOSE 179* 164*  BUN 59* 69*  CREATININE 2.13* 2.12*  CALCIUM 8.2* 7.5*   PT/INR No results for input(s): LABPROT, INR in the last 72 hours. Comprehensive Metabolic Panel:    Component Value Date/Time   NA 154 (H) 07/01/2017 0250   NA 146 (H) 06/30/2017 0307   K 3.1 (L) 07/01/2017 0250   K 3.3 (L) 06/30/2017 0307   CL 117 (H) 07/01/2017 0250   CL 112 (H) 06/30/2017 0307   CO2 27 07/01/2017 0250   CO2 21 (L) 06/30/2017 0307   BUN 69 (H) 07/01/2017 0250   BUN 59 (H) 06/30/2017 0307   CREATININE 2.12 (H) 07/01/2017 0250   CREATININE 2.13 (H)  06/30/2017 0307   CREATININE 0.99 11/28/2013 1536   GLUCOSE 164 (H) 07/01/2017 0250   GLUCOSE 179 (H) 06/30/2017 0307   CALCIUM 7.5 (L) 07/01/2017 0250   CALCIUM 8.2 (L) 06/30/2017 0307   AST 15 05/30/2015 0336   AST 15 05/29/2015 0344   ALT 11 (L) 05/30/2015 0336   ALT 10 (L) 05/29/2015 0344   ALKPHOS 106 05/30/2015 0336   ALKPHOS 88 05/29/2015 0344   BILITOT 1.5 (H) 05/30/2015 0336   BILITOT 1.8 (H) 05/29/2015 0344   PROT 6.2 (L) 05/30/2015 0336   PROT 5.9 (L) 05/29/2015 0344   ALBUMIN 2.3 (L) 06/27/2017 0349   ALBUMIN 2.2 (L) 05/30/2015 0336     Studies/Results: Dg Abd 1 View  Result Date: 06/30/2017 CLINICAL DATA:  NG tube placement EXAM: ABDOMEN - 1 VIEW COMPARISON:  06/29/2017 FINDINGS: Patchy atelectasis or infiltrate at the right base. Interim placement of esophageal tube, tip projects over the gastric outlet. Surgical clips in the right upper quadrant. Dilated loops of small bowel again demonstrated in the upper abdomen. IMPRESSION: Esophageal tube tip overlies the distal stomach. Dilated loops of bowel re- demonstrated in the upper abdomen Electronically Signed   By: Donavan Foil M.D.   On: 06/30/2017 00:24   Dg Chest Port 1 View  Result Date: 07/01/2017 CLINICAL DATA:  Respiratory failure. EXAM: PORTABLE CHEST  1 VIEW COMPARISON:  06/30/2017 . FINDINGS: NG tube noted with tip below left hemidiaphragm. Cardiomegaly with pulmonary venous congestion. Bilateral infiltrates/edema with slight worsening from prior exam. Basilar atelectasis. Tiny bilateral pleural effusions cannot be excluded. Right shoulder replacement. Surgical staples over the right chest. IMPRESSION: 1.  NG tube noted with its tip below left hemidiaphragm. 2. Cardiomegaly with bilateral pulmonary infiltrates/edema with slight worsening from prior exam. Small bilateral pleural effusions. Findings suggest CHF. Bibasilar pneumonia cannot be excluded. Electronically Signed   By: Marcello Moores  Register   On: 07/01/2017 07:01    Dg Chest Port 1 View  Result Date: 06/30/2017 CLINICAL DATA:  Follow-up pneumonia. EXAM: PORTABLE CHEST 1 VIEW 6:04 p.m.: COMPARISON:  Portable chest x-ray earlier today 8:14 a.m., 06/29/2017 and earlier. FINDINGS: Suboptimal inspiration. Cardiac silhouette mildly enlarged, unchanged. Airspace consolidation involving the left lung, unchanged. Dense consolidation in the right lower lobe, worse than earlier today. Lungs otherwise clear. Pulmonary vascularity normal without evidence of pulmonary edema. Nasogastric tube tip projects over the expected location of the gastric fundus. IMPRESSION: 1. Stable left lung pneumonia. 2. Worsening atelectasis and/or pneumonia involving the right lower lobe since earlier today. 3. Mild cardiomegaly without pulmonary edema. Electronically Signed   By: Evangeline Dakin M.D.   On: 06/30/2017 18:19   Dg Chest Port 1 View  Result Date: 06/30/2017 CLINICAL DATA:  Vomiting with aspiration pneumonia. EXAM: PORTABLE CHEST 1 VIEW COMPARISON:  Portable chest x-ray of June 29, 2017 FINDINGS: There has been interval blooming of the alveolar opacities on the left consistent with known aspiration. On the right the lung appears clear. The patient is rotated slightly toward the right. The cardiac silhouette remains enlarged. The pulmonary vascularity is not clearly engorged. The esophagogastric tube tip lies in the gastric cardia with the proximal port at approximately the level of the GE junction. IMPRESSION: Interval increase in alveolar opacity in the left mid and lower lung consistent with the suspected aspiration pneumonia. No definite right-sided pneumonia. Advancement of the nasogastric tube by 5-10 cm would assure that the proximal port remains below the GE junction. Electronically Signed   By: David  Martinique M.D.   On: 06/30/2017 08:50   Dg Abd Portable 1v  Result Date: 07/01/2017 CLINICAL DATA:  Ileus EXAM: PORTABLE ABDOMEN - 1 VIEW COMPARISON:  Yesterday FINDINGS: Diffuse  gaseous distension of small and large bowel consistent with history of ileus. No noted progression since prior. No concerning mass effect or gas collection. Cholecystectomy clips. Nasogastric tube tip is not covered on this study, but is seen on the abdominal radiograph. IMPRESSION: Unchanged ileus pattern. Electronically Signed   By: Monte Fantasia M.D.   On: 07/01/2017 07:07   Dg Abd Portable 1v  Result Date: 06/30/2017 CLINICAL DATA:  Vomiting EXAM: PORTABLE ABDOMEN - 1 VIEW COMPARISON:  06/29/2017 FINDINGS: NG tube has pulled back with the tip in the fundus of the stomach. Gaseous distention of bowel again noted which appears to be both large and small bowel. This may have worsened since prior study. IMPRESSION: Worsening gaseous distention of bowel which appears to be both large and small bowel suggesting ileus. Electronically Signed   By: Rolm Baptise M.D.   On: 06/30/2017 08:48    Anti-infectives: Anti-infectives    Start     Dose/Rate Route Frequency Ordered Stop   07/01/17 1400  vancomycin (VANCOCIN) 1,500 mg in sodium chloride 0.9 % 500 mL IVPB     1,500 mg 250 mL/hr over 120 Minutes Intravenous Every 24 hours 06/30/17  1313     06/30/17 1315  vancomycin (VANCOCIN) 2,000 mg in sodium chloride 0.9 % 500 mL IVPB     2,000 mg 250 mL/hr over 120 Minutes Intravenous  Once 06/30/17 1305 06/30/17 1713   06/29/17 0415  piperacillin-tazobactam (ZOSYN) IVPB 3.375 g     3.375 g 12.5 mL/hr over 240 Minutes Intravenous Every 8 hours 06/29/17 0400     06/28/17 1300  ceFAZolin (ANCEF) IVPB 1 g/50 mL premix  Status:  Discontinued     1 g 100 mL/hr over 30 Minutes Intravenous Every 8 hours 06/27/17 1858 06/29/17 0400   06/13/2017 2000  ceFAZolin (ANCEF) IVPB 2g/100 mL premix  Status:  Discontinued     2 g 200 mL/hr over 30 Minutes Intravenous Every 6 hours 06/29/2017 1824 06/04/2017 1835   06/04/2017 1316  ceFAZolin (ANCEF) 3 g in dextrose 5 % 50 mL IVPB     3 g 130 mL/hr over 30 Minutes Intravenous 30  min pre-op 06/01/2017 1316 06/19/2017 1359   06/05/2017 1400  cefTRIAXone (ROCEPHIN) 1 g in dextrose 5 % 50 mL IVPB  Status:  Discontinued     1 g 100 mL/hr over 30 Minutes Intravenous Every 24 hours 06/27/2017 1334 06/27/17 1838       Feliz Lincoln J 07/01/2017

## 2017-07-01 NOTE — Progress Notes (Signed)
Patient more awake and agitated.  Using left arm to pull O2 mask off despite having a mitten on.  Pulling at NG tube with mitten on.  Multiple attempts to redirect and reorient patient have failed.  MD notified, will continue to monitor.

## 2017-07-02 ENCOUNTER — Inpatient Hospital Stay (HOSPITAL_COMMUNITY): Payer: Medicare Other

## 2017-07-02 DIAGNOSIS — J690 Pneumonitis due to inhalation of food and vomit: Secondary | ICD-10-CM

## 2017-07-02 DIAGNOSIS — I469 Cardiac arrest, cause unspecified: Secondary | ICD-10-CM

## 2017-07-02 DIAGNOSIS — R6520 Severe sepsis without septic shock: Secondary | ICD-10-CM

## 2017-07-02 DIAGNOSIS — N132 Hydronephrosis with renal and ureteral calculous obstruction: Secondary | ICD-10-CM

## 2017-07-02 DIAGNOSIS — J9601 Acute respiratory failure with hypoxia: Secondary | ICD-10-CM

## 2017-07-02 DIAGNOSIS — K567 Ileus, unspecified: Secondary | ICD-10-CM

## 2017-07-02 DIAGNOSIS — A419 Sepsis, unspecified organism: Secondary | ICD-10-CM

## 2017-07-02 LAB — BASIC METABOLIC PANEL
Anion gap: 10 (ref 5–15)
BUN: 54 mg/dL — AB (ref 6–20)
CO2: 33 mmol/L — ABNORMAL HIGH (ref 22–32)
CREATININE: 1.63 mg/dL — AB (ref 0.61–1.24)
Calcium: 7.3 mg/dL — ABNORMAL LOW (ref 8.9–10.3)
Chloride: 116 mmol/L — ABNORMAL HIGH (ref 101–111)
GFR, EST AFRICAN AMERICAN: 49 mL/min — AB (ref >60)
GFR, EST NON AFRICAN AMERICAN: 42 mL/min — AB (ref >60)
Glucose, Bld: 196 mg/dL — ABNORMAL HIGH (ref 65–99)
POTASSIUM: 3.1 mmol/L — AB (ref 3.5–5.1)
SODIUM: 159 mmol/L — AB (ref 135–145)

## 2017-07-02 LAB — CBC
HCT: 36.2 % — ABNORMAL LOW (ref 39.0–52.0)
Hemoglobin: 11.3 g/dL — ABNORMAL LOW (ref 13.0–17.0)
MCH: 28.3 pg (ref 26.0–34.0)
MCHC: 31.2 g/dL (ref 30.0–36.0)
MCV: 90.5 fL (ref 78.0–100.0)
Platelets: 289 10*3/uL (ref 150–400)
RBC: 4 MIL/uL — AB (ref 4.22–5.81)
RDW: 17 % — ABNORMAL HIGH (ref 11.5–15.5)
WBC: 34.7 10*3/uL — ABNORMAL HIGH (ref 4.0–10.5)

## 2017-07-02 LAB — GLUCOSE, CAPILLARY: GLUCOSE-CAPILLARY: 198 mg/dL — AB (ref 65–99)

## 2017-07-02 LAB — MAGNESIUM: MAGNESIUM: 3 mg/dL — AB (ref 1.7–2.4)

## 2017-07-02 LAB — PHOSPHORUS: Phosphorus: 2.6 mg/dL (ref 2.5–4.6)

## 2017-07-02 LAB — ECHOCARDIOGRAM COMPLETE
Height: 68 in
Weight: 4712.55 oz

## 2017-07-02 LAB — LACTIC ACID, PLASMA: Lactic Acid, Venous: 2.8 mmol/L (ref 0.5–1.9)

## 2017-07-02 LAB — PROCALCITONIN: Procalcitonin: 2.26 ng/mL

## 2017-07-02 MED ORDER — SODIUM BICARBONATE 8.4 % IV SOLN
INTRAVENOUS | Status: AC
  Filled 2017-07-02: qty 50

## 2017-07-02 MED ORDER — GUAIFENESIN-DM 100-10 MG/5ML PO SYRP: 10.0000 mL | ORAL_SOLUTION | ORAL | Status: DC | PRN

## 2017-07-02 MED ORDER — VANCOMYCIN HCL IN DEXTROSE 1.5-5 GM/500ML-% IV SOLN: 1500.0000 mg | INTRAVENOUS | Status: DC

## 2017-07-02 MED ORDER — HYDROCORTISONE 1 % EX CREA: 1.0000 "application " | TOPICAL_CREAM | Freq: Three times a day (TID) | CUTANEOUS | Status: DC | PRN

## 2017-07-02 MED ORDER — PERFLUTREN LIPID MICROSPHERE
1.0000 mL | INTRAVENOUS | Status: AC | PRN
  Administered 2017-07-02: 2 mL via INTRAVENOUS
  Filled 2017-07-02: qty 10

## 2017-07-02 MED ORDER — MAGIC MOUTHWASH: 15.0000 mL | Freq: Four times a day (QID) | ORAL | Status: DC | PRN

## 2017-07-02 MED ORDER — EPINEPHRINE PF 1 MG/ML IJ SOLN
0.5000 ug/min | INTRAVENOUS | Status: DC
  Filled 2017-07-02: qty 4

## 2017-07-02 MED ORDER — MENTHOL 3 MG MT LOZG
1.0000 | LOZENGE | OROMUCOSAL | Status: DC | PRN
Start: 2017-07-02 — End: 2017-07-02

## 2017-07-02 MED ORDER — PANTOPRAZOLE SODIUM 40 MG IV SOLR
40.0000 mg | Freq: Two times a day (BID) | INTRAVENOUS | Status: DC
  Administered 2017-07-02: 40 mg via INTRAVENOUS
  Filled 2017-07-02: qty 40

## 2017-07-02 MED ORDER — PHENYLEPHRINE HCL-NACL 10-0.9 MG/250ML-% IV SOLN
INTRAVENOUS | Status: AC
  Filled 2017-07-02: qty 250

## 2017-07-02 MED ORDER — ETOMIDATE 2 MG/ML IV SOLN
20.0000 mg | Freq: Once | INTRAVENOUS | Status: AC
  Administered 2017-07-02: 20 mg via INTRAVENOUS

## 2017-07-02 MED ORDER — HYDROCORTISONE 2.5 % RE CREA: 1.0000 "application " | TOPICAL_CREAM | Freq: Four times a day (QID) | RECTAL | Status: DC | PRN

## 2017-07-02 MED ORDER — VANCOMYCIN HCL 10 G IV SOLR
INTRAVENOUS | Status: DC
  Filled 2017-07-02: qty 500

## 2017-07-02 MED ORDER — INSULIN ASPART 100 UNIT/ML ~~LOC~~ SOLN: 0.0000 [IU] | SUBCUTANEOUS | Status: DC

## 2017-07-02 MED ORDER — MIDAZOLAM HCL 2 MG/2ML IJ SOLN
INTRAMUSCULAR | Status: AC
  Administered 2017-07-02: 2 mg
  Filled 2017-07-02: qty 2

## 2017-07-02 MED ORDER — VANCOMYCIN HCL IN DEXTROSE 2-5 GM/500ML-% IV SOLN: 1500.0000 mg | INTRAVENOUS | Status: DC

## 2017-07-02 MED ORDER — PHENOL 1.4 % MT LIQD: 2.0000 | OROMUCOSAL | Status: DC | PRN

## 2017-07-02 MED ORDER — FENTANYL CITRATE (PF) 100 MCG/2ML IJ SOLN
INTRAMUSCULAR | Status: AC
  Administered 2017-07-02: 50 ug
  Filled 2017-07-02: qty 2

## 2017-07-02 MED ORDER — DIPHENHYDRAMINE HCL 50 MG/ML IJ SOLN: 12.5000 mg | Freq: Four times a day (QID) | INTRAMUSCULAR | Status: DC | PRN

## 2017-07-02 MED ORDER — POTASSIUM CHLORIDE 10 MEQ/100ML IV SOLN
10.0000 meq | INTRAVENOUS | Status: AC
  Administered 2017-07-02 (×4): 10 meq via INTRAVENOUS
  Filled 2017-07-02 (×4): qty 100

## 2017-07-02 MED ORDER — POTASSIUM CHLORIDE 10 MEQ/100ML IV SOLN: 10.0000 meq | INTRAVENOUS | Status: DC

## 2017-07-02 MED ORDER — ALUM & MAG HYDROXIDE-SIMETH 200-200-20 MG/5ML PO SUSP: 30.0000 mL | Freq: Four times a day (QID) | ORAL | Status: DC | PRN

## 2017-07-02 MED ORDER — ROCURONIUM BROMIDE 50 MG/5ML IV SOLN
80.0000 mg | Freq: Once | INTRAVENOUS | Status: AC
  Administered 2017-07-02: 80 mg via INTRAVENOUS
  Filled 2017-07-02: qty 8

## 2017-07-02 MED ORDER — LIP MEDEX EX OINT
1.0000 "application " | TOPICAL_OINTMENT | Freq: Two times a day (BID) | CUTANEOUS | Status: DC
  Administered 2017-07-02: 1 via TOPICAL
  Filled 2017-07-02: qty 7

## 2017-07-02 MED ORDER — NOREPINEPHRINE BITARTRATE 1 MG/ML IV SOLN
0.0000 ug/min | INTRAVENOUS | Status: DC
  Filled 2017-07-02: qty 4

## 2017-07-02 DEATH — deceased

## 2017-07-03 LAB — CULTURE, BLOOD (ROUTINE X 2)
Culture: NO GROWTH
Culture: NO GROWTH
Special Requests: ADEQUATE
Special Requests: ADEQUATE

## 2017-08-01 NOTE — Discharge Summary (Signed)
Physician Discharge Summary  Patient ID: TARVIS BLOSSOM MRN: [id number] DOB/AGE: 06-Feb-1951 66 y.o.  Admit date: 06/17/2017 Discharge date: 07/21/2017  Admission Diagnoses: Aspiration  Discharge Diagnoses:  Principal Problem:   Acute respiratory failure with hypoxia [facility]) Active Problems:   Mental retardation   Essential hypertension, benign   CKD (chronic kidney disease), stage III   Hypokalemia   Hypothyroidism   Fall   Tobacco abuse   Right comminuted and displaced proximal humeral fracture s/p rev TSR 06/08/2017   Abdominal distension   Abdominal pain   S/P shoulder replacement, right   Hypoxia   Ileus (HCC)   Cardiac arrest, cause unspecified ([place])   Ureteral stone with hydronephrosis s/p removal & stenting 06/21/2017   Aspiration pneumonia [facility])   Discharged Condition: deceased  Hospital Course:  66 year old male with medical history significant of Down syndrome, MR, hypothyroidism, chronic kidney disease stage III chronic venous insufficiency fell 8/24 and required reverse rt shoulder replacement and rt ureteral stone extraction and stent placement by Drs. Norris and [employer]. He developed abd distension presumed ileus 8/29 and NGT was placed. He vomited early am 8/30 and developed resp distress and PCCM was called to evaluate and treat.  He continued to have tenuous respiratory status maintained on BiPAP. He had marked worsening of his respiratory status on 07/07/17 and was intubated and central line placed for hypotension. He responded initially to fluid but later became bradycardic Code team was able to revive him initially with 1 mg epi  10 minutes later. He became bradycardic again and subsequently went into PEA and asystolic arrest. Resuscitation attempted for 50 mins with CPR, multiple rounds of epi, bicarbonate. He was started on max epinephrine and Levophed drip with no return of circulation. Code stopped at 12:54 pm His father was present during the  code.   Disposition: 20-Expired   Allergies as of 07/07/2017      Reactions   Lisinopril Swelling   Angioedema of the tongue      Medication List    TAKE these medications   HYDROcodone-acetaminophen 5-325 MG tablet Commonly known as:  NORCO Take 1 tablet by mouth every 6 (six) hours as needed for moderate pain.   methocarbamol 500 MG tablet Commonly known as:  ROBAXIN Take 1 tablet (500 mg total) by mouth 3 (three) times daily as needed.     ASK your doctor about these medications   atenolol 100 MG tablet Commonly known as:  TENORMIN Take 100 mg by mouth daily.   diltiazem 360 MG 24 hr capsule Commonly known as:  TIAZAC Take 360 mg by mouth daily.   furosemide 80 MG tablet Commonly known as:  LASIX Take 0.5 tablets (40 mg total) by mouth daily. Resume in 2 days   levothyroxine 25 MCG tablet Commonly known as:  SYNTHROID, LEVOTHROID Take 25 mcg by mouth daily before breakfast.   levothyroxine 200 MCG tablet Commonly known as:  SYNTHROID, LEVOTHROID Take 200 mcg by mouth daily. Takes along 9mcg to make a total 274mcg   omega-3 acid ethyl esters 1 g capsule Commonly known as:  LOVAZA Take 1 g by mouth daily.   Potassium Chloride ER 20 MEQ Tbcr Take 20 mEq by mouth 3 (three) times daily.   potassium chloride SA 20 MEQ tablet Commonly known as:  K-DUR,KLOR-CON Take 20 mEq by mouth daily.            Discharge Care Instructions        Start  Ordered   06/02/2017 0000  HYDROcodone-acetaminophen (NORCO) 5-325 MG tablet  Every 6 hours PRN     06/23/2017 1710   06/07/2017 0000  methocarbamol (ROBAXIN) 500 MG tablet  3 times daily PRN     06/13/2017 1710      Contact information for follow-up providers    Netta Cedars, MD. Call in 2 weeks.   Specialty:  Orthopedic Surgery Why:  [phone] Contact information: [address] [place] [address] [phone]            Contact information for after-discharge care    Destination     [facility] .   Specialty:  [place] information: [place] [phone]                  Signed: Marshell Garfinkel 07/21/2017, 5:02 PM

## 2017-08-01 NOTE — Progress Notes (Signed)
[place] Progress Note Patient Name: Eduardo Herman DOB: December 12, 1950 MRN: [id number]   Date of Service  07/03/2017  HPI/Events of Note    eICU Interventions  Hypokalemia -repleted      Intervention Category Intermediate Interventions: Electrolyte abnormality - evaluation and management  [name] V. 07-03-17, 5:24 AM

## 2017-08-01 NOTE — Progress Notes (Signed)
Assessment  Ileus- -most likely from PNA & hypokalemia in dev delay patient with probable h/o copnstipation/ileus.  NO evidence SBO.  Similar plan for now though: -NGT -bowel rest -PR suppositories -if output NGT <1L & BMs, try clamping - too soon for now with high NGT volume -add PPI with blood in NGT   Fall with fracture of humerus anatomical neck, right S/P shoulder replacement, right   Hypokalemia-replacement essential   Aspiration PNA-IV ABx.  ?Intubate if worsens   Mental retardation - usually moderate functioning - MS decline w delerium a concern        LOS: 8 days     7 Days Post-Op  Chief Complaint/Subjective: More tired Getting ECHO ICU RN at bedside  Objective: Vital signs in last 24 hours: Temp:  [100.2 F (37.9 C)-102 F (38.9 C)] 102 F (38.9 C) (09/01 0700) Pulse Rate:  [71-80] 72 (09/01 0700) Resp:  [15-46] 46 (09/01 0700) BP: (123-183)/(50-91) 141/50 (09/01 0700) SpO2:  [91 %-98 %] 95 % (09/01 0700) FiO2 (%):  [55 %-100 %] 55 % (08/31 2000) Last BM Date: 07/29/17  Intake/Output from previous day: 08/31 0701 - 09/01 0700 In: 3062.5 [I.V.:2200; IV Piggyback:862.5] Out: 4100 [Urine:2000; Emesis/NG output:2100] Intake/Output this shift: No intake/output data recorded.  PE: General: Pt sleepy in moderate distress Eyes: PERRL, normal EOM. Sclera nonicteric Neuro: CN II-XII intact w/o focal sensory/motor deficits. Lymph: No head/neck/groin lymphadenopathy Psych:  No delerium/psychosis/paranoia HENT: Normocephalic, Mucus membranes moist.  No thrush Neck: Supple, No tracheal deviation Chest: No pain.  Good respiratory excursion.  Coarse BS with mild stridor CV:  Pulses intact.  Regular rhythm MS: Normal AROM mjr joints.  No obvious deformity Abdomen: Soft obese, Moderately.  Nontender.  No incarcerated hernias. Ext:  SCDs BLE.  No significant edema.  No cyanosis  RUE in sling Skin: No petechiae / purpura   Lab Results:   Recent Labs   06/30/17 0307 07/01/17 0250  WBC 27.3* 27.5*  HGB 12.7* 11.2*  HCT 38.0* 34.6*  PLT 244 282   BMET  Recent Labs  07/01/17 1742 29-Jul-2017 0310  NA 157* 159*  K 2.6* 3.1*  CL 118* 116*  CO2 30 33*  GLUCOSE 171* 196*  BUN 60* 54*  CREATININE 1.76* 1.63*  CALCIUM 7.6* 7.3*   PT/INR No results for input(s): LABPROT, INR in the last 72 hours. Comprehensive Metabolic Panel:    Component Value Date/Time   NA 159 (H) 07-29-17 0310   NA 157 (H) 07/01/2017 1742   K 3.1 (L) 07/29/2017 0310   K 2.6 (LL) 07/01/2017 1742   CL 116 (H) 2017/07/29 0310   CL 118 (H) 07/01/2017 1742   CO2 33 (H) 2017/07/29 0310   CO2 30 07/01/2017 1742   BUN 54 (H) 07-29-2017 0310   BUN 60 (H) 07/01/2017 1742   CREATININE 1.63 (H) 29-Jul-2017 0310   CREATININE 1.76 (H) 07/01/2017 1742   CREATININE 0.99 11/28/2013 1536   GLUCOSE 196 (H) 07/29/17 0310   GLUCOSE 171 (H) 07/01/2017 1742   CALCIUM 7.3 (L) July 29, 2017 0310   CALCIUM 7.6 (L) 07/01/2017 1742   AST 15 05/30/2015 0336   AST 15 05/29/2015 0344   ALT 11 (L) 05/30/2015 0336   ALT 10 (L) 05/29/2015 0344   ALKPHOS 106 05/30/2015 0336   ALKPHOS 88 05/29/2015 0344   BILITOT 1.5 (H) 05/30/2015 0336   BILITOT 1.8 (H) 05/29/2015 0344   PROT 6.2 (L) 05/30/2015 0336   PROT 5.9 (L) 05/29/2015 0344   ALBUMIN  2.3 (L) 06/27/2017 0349   ALBUMIN 2.2 (L) 05/30/2015 0336     Studies/Results: US Renal  Result Date: 07/01/2017 CLINICAL DATA:  Elevated creatinine. EXAM: RENAL / URINARY TRACT ULTRASOUND COMPLETE COMPARISON:  CT abdomen pelvis dated June 29, 2017. FINDINGS: Right Kidney: Length: 13.8 cm. Increased echogenicity. Two simple appearing cysts are noted, the largest measuring up to 2.3 cm. No mass or hydronephrosis visualized. Left Kidney: Length: 14.4 cm. Increased echogenicity. No mass or hydronephrosis visualized. Bladder: Appears normal for degree of bladder distention. IMPRESSION: Increased echogenicity of the bilateral kidneys, as can be  seen with medical renal disease. No hydronephrosis. Electronically Signed   By: Titus Dubin M.D.   On: 07/01/2017 15:01   Dg Chest Port 1 View  Result Date: 2017/07/28 CLINICAL DATA:  Respiratory failure. EXAM: PORTABLE CHEST 1 VIEW COMPARISON:  One-view chest x-ray 07/01/2017 FINDINGS: The heart size is exaggerated by low lung volumes. NG tube is in place. Interstitial and airspace disease is again seen bilaterally. Right greater than left pleural effusions are noted. Patient is rotated to the right. There is no significant interval change. IMPRESSION: 1. Cardiomegaly with bilateral interstitial and airspace disease likely reflecting edema and congestive heart failure. 2. Lobar pneumonia is still considered. Electronically Signed   By: San Morelle M.D.   On: 07/28/17 07:34   Dg Chest Port 1 View  Result Date: 07/01/2017 CLINICAL DATA:  Respiratory failure. EXAM: PORTABLE CHEST 1 VIEW COMPARISON:  06/30/2017 . FINDINGS: NG tube noted with tip below left hemidiaphragm. Cardiomegaly with pulmonary venous congestion. Bilateral infiltrates/edema with slight worsening from prior exam. Basilar atelectasis. Tiny bilateral pleural effusions cannot be excluded. Right shoulder replacement. Surgical staples over the right chest. IMPRESSION: 1.  NG tube noted with its tip below left hemidiaphragm. 2. Cardiomegaly with bilateral pulmonary infiltrates/edema with slight worsening from prior exam. Small bilateral pleural effusions. Findings suggest CHF. Bibasilar pneumonia cannot be excluded. Electronically Signed   By: Marcello Moores  Register   On: 07/01/2017 07:01   Dg Chest Port 1 View  Result Date: 06/30/2017 CLINICAL DATA:  Follow-up pneumonia. EXAM: PORTABLE CHEST 1 VIEW 6:04 p.m.: COMPARISON:  Portable chest x-ray earlier today 8:14 a.m., 06/29/2017 and earlier. FINDINGS: Suboptimal inspiration. Cardiac silhouette mildly enlarged, unchanged. Airspace consolidation involving the left lung, unchanged. Dense  consolidation in the right lower lobe, worse than earlier today. Lungs otherwise clear. Pulmonary vascularity normal without evidence of pulmonary edema. Nasogastric tube tip projects over the expected location of the gastric fundus. IMPRESSION: 1. Stable left lung pneumonia. 2. Worsening atelectasis and/or pneumonia involving the right lower lobe since earlier today. 3. Mild cardiomegaly without pulmonary edema. Electronically Signed   By: Evangeline Dakin M.D.   On: 06/30/2017 18:19   Dg Chest Port 1 View  Result Date: 06/30/2017 CLINICAL DATA:  Vomiting with aspiration pneumonia. EXAM: PORTABLE CHEST 1 VIEW COMPARISON:  Portable chest x-ray of June 29, 2017 FINDINGS: There has been interval blooming of the alveolar opacities on the left consistent with known aspiration. On the right the lung appears clear. The patient is rotated slightly toward the right. The cardiac silhouette remains enlarged. The pulmonary vascularity is not clearly engorged. The esophagogastric tube tip lies in the gastric cardia with the proximal port at approximately the level of the GE junction. IMPRESSION: Interval increase in alveolar opacity in the left mid and lower lung consistent with the suspected aspiration pneumonia. No definite right-sided pneumonia. Advancement of the nasogastric tube by 5-10 cm would assure that the proximal port remains  below the GE junction. Electronically Signed   By: David  Martinique M.D.   On: 06/30/2017 08:50   Dg Abd Portable 1v  Result Date: 07/01/2017 CLINICAL DATA:  Ileus EXAM: PORTABLE ABDOMEN - 1 VIEW COMPARISON:  Yesterday FINDINGS: Diffuse gaseous distension of small and large bowel consistent with history of ileus. No noted progression since prior. No concerning mass effect or gas collection. Cholecystectomy clips. Nasogastric tube tip is not covered on this study, but is seen on the abdominal radiograph. IMPRESSION: Unchanged ileus pattern. Electronically Signed   By: Monte Fantasia M.D.    On: 07/01/2017 07:07   Dg Abd Portable 1v  Result Date: 06/30/2017 CLINICAL DATA:  Vomiting EXAM: PORTABLE ABDOMEN - 1 VIEW COMPARISON:  06/29/2017 FINDINGS: NG tube has pulled back with the tip in the fundus of the stomach. Gaseous distention of bowel again noted which appears to be both large and small bowel. This may have worsened since prior study. IMPRESSION: Worsening gaseous distention of bowel which appears to be both large and small bowel suggesting ileus. Electronically Signed   By: Rolm Baptise M.D.   On: 06/30/2017 08:48    Anti-infectives: Anti-infectives    Start     Dose/Rate Route Frequency Ordered Stop   07/01/17 1400  vancomycin (VANCOCIN) 1,500 mg in sodium chloride 0.9 % 500 mL IVPB     1,500 mg 250 mL/hr over 120 Minutes Intravenous Every 24 hours 06/30/17 1313     06/30/17 1315  vancomycin (VANCOCIN) 2,000 mg in sodium chloride 0.9 % 500 mL IVPB     2,000 mg 250 mL/hr over 120 Minutes Intravenous  Once 06/30/17 1305 06/30/17 1713   06/29/17 0415  piperacillin-tazobactam (ZOSYN) IVPB 3.375 g     3.375 g 12.5 mL/hr over 240 Minutes Intravenous Every 8 hours 06/29/17 0400     06/28/17 1300  ceFAZolin (ANCEF) IVPB 1 g/50 mL premix  Status:  Discontinued     1 g 100 mL/hr over 30 Minutes Intravenous Every 8 hours 06/27/17 1858 06/29/17 0400   06/15/2017 2000  ceFAZolin (ANCEF) IVPB 2g/100 mL premix  Status:  Discontinued     2 g 200 mL/hr over 30 Minutes Intravenous Every 6 hours 06/13/2017 1824 06/06/2017 1835   06/16/2017 1316  ceFAZolin (ANCEF) 3 g in dextrose 5 % 50 mL IVPB     3 g 130 mL/hr over 30 Minutes Intravenous 30 min pre-op 06/15/2017 1316 06/11/2017 1359   06/29/2017 1400  cefTRIAXone (ROCEPHIN) 1 g in dextrose 5 % 50 mL IVPB  Status:  Discontinued     1 g 100 mL/hr over 30 Minutes Intravenous Every 24 hours 06/15/2017 1334 06/27/17 1838       Janalee Grobe C. Jul 30, 2017

## 2017-08-01 NOTE — Progress Notes (Signed)
PROGRESS NOTE    Eduardo Herman  WRU:[id number] DOB: 10/24/1951 DOA: 06/03/2017 PCP: Leonard Downing, MD    Brief Narrative:  66 year old male with medical history significant of Down syndrome, MR, hypothyroidism, chronic kidney disease stage III chronic venous insufficiency who presented to the emergency department after mechanical fall and right shoulder pain. Patient was found to have R shoulder fracture orthopedic surgery was consulted. CT of the abdomen was done due to abdominal pain she revealed a 5 mm kidney stone with possible pyelonephritis. Patient was started on IV abx, flomax and urology was consulted. Patient now is status post right shoulder arthroplasty and cystoscopy with right thorascopic with stent placement.   Assessment & Plan:   Principal Problem:   Fall Active Problems:   Mental retardation   Essential hypertension, benign   CKD (chronic kidney disease), stage III   Hypokalemia   Hypothyroidism   Tobacco abuse   Fracture of humerus anatomical neck, right, closed, initial encounter   Abdominal distension   Abdominal pain   S/P shoulder replacement, right   Hypoxia   Ileus ([place])  Fracture of right humerus Pt is status post mechanical fall Status post right shoulder arthroplasty on 06/26/2017 Had been followed by Orthopedic Surgery  Right ureteral stone with possible pyelonephritis Status post cystoscopy with right ureteroscopy and stent placement Continue antibiotics, urine culture grew Proteus mirabilis pansensitive  Initially treated with Rocephin, this was discussed related to Ancef on 8/27 Pt has not been compliant with incentive spirometry prior Pt had been continued on Flomax as tolerated  Acute kidney injury on Chronic kidney disease stage III - creatinine improving Baseline creatinine on 2016 was 1.79, although patient on admission was found to have creatinine of 2.13 creatinine has responded to IV fluids which meets criteria for acute renal  failure. Cr improving with continued IVF hydration  Hypokalemia - resolved Somewhat improved, still low this AM Potassium replacement already ordered by Surgery  Hypertension Blood pressure continues to be stable during hospital stay Initially continued on atenolol and Cardizem Given recent events, patient remains on nonrebreather. Pt now on atenolol to IV beta blocker with hold parameters. Cardizem on hold until patient able to reliably tolerate by mouth.  Leukocytosis secondary to Ileus and aspiration PNA with severe sepsis WBC trending up overnight, worsening Have ordered and reviewed CXR and abd xray. Findings of worse ileus and worsened PNA Continued on zosyn and added vanc Continues with increased resp rate and coarse breath sounds Have ordered lactate, reviewed. Found to be 2.8  Down syndrome with MR Stable, father legal guardian  Ileus Overnight events noted. Patient noted to have increase abdominal distention with nausea, vomiting, concerns for likely aspiration. Abd appears more distended Repeat abd xray with findings of worsened ileus General surgery following. Persis tent ileus noted  Aspiration Pneumonia Recent events noted. Patient with episode of vomiting, increased O2 requirements, imaging studies suggesting likely aspiration. Patient started on Zosyn. Patient remains on nonrebreather. Remains with tachypnea PCCM continues to follow. With worsening condition and respiratory status, pt had been intubated this AM. To be placed under critical care service  DVT prophylaxis:  SCDs Code Status:  Full code Family Communication:  Patient in room, family not at bedside Disposition Plan:  Uncertain at this time  Consultants:   Orthopedic surgery  Urology  PCCM  General Surgery  Procedures:     Antimicrobials: Anti-infectives    Start     Dose/Rate Route Frequency Ordered Stop   07/01/17 1400  vancomycin (  VANCOCIN) 1,500 mg in sodium chloride 0.9 % 500  mL IVPB     1,500 mg 250 mL/hr over 120 Minutes Intravenous Every 24 hours 06/30/17 1313     06/30/17 1315  vancomycin (VANCOCIN) 2,000 mg in sodium chloride 0.9 % 500 mL IVPB     2,000 mg 250 mL/hr over 120 Minutes Intravenous  Once 06/30/17 1305 06/30/17 1713   06/29/17 0415  piperacillin-tazobactam (ZOSYN) IVPB 3.375 g     3.375 g 12.5 mL/hr over 240 Minutes Intravenous Every 8 hours 06/29/17 0400     06/28/17 1300  ceFAZolin (ANCEF) IVPB 1 g/50 mL premix  Status:  Discontinued     1 g 100 mL/hr over 30 Minutes Intravenous Every 8 hours 06/27/17 1858 06/29/17 0400   06/17/2017 2000  ceFAZolin (ANCEF) IVPB 2g/100 mL premix  Status:  Discontinued     2 g 200 mL/hr over 30 Minutes Intravenous Every 6 hours 06/27/2017 1824 06/24/2017 1835   06/16/2017 1316  ceFAZolin (ANCEF) 3 g in dextrose 5 % 50 mL IVPB     3 g 130 mL/hr over 30 Minutes Intravenous 30 min pre-op 06/02/2017 1316 06/30/2017 1359   06/12/2017 1400  cefTRIAXone (ROCEPHIN) 1 g in dextrose 5 % 50 mL IVPB  Status:  Discontinued     1 g 100 mL/hr over 30 Minutes Intravenous Every 24 hours 06/14/2017 1334 06/27/17 1838      Subjective: Unable to assess  Objective: Vitals:   2017/07/11 0700 07-11-17 0800 11-Jul-2017 0900 2017-07-11 1000  BP: (!) 141/50 132/69 (!) 151/85 (!) 175/57  Pulse: 72 74 75 82  Resp: (!) 46 (!) 36 (!) 39 (!) 44  Temp: (!) 102 F (38.9 C) (!) 102.2 F (39 C) (!) 102.4 F (39.1 C) (!) 102.6 F (39.2 C)  TempSrc:  Core (Comment)    SpO2: 95% 94% 96% 93%  Weight:      Height:        Intake/Output Summary (Last 24 hours) at 07/11/17 1127 Last data filed at 2017-07-11 1000  Gross per 24 hour  Intake           3087.5 ml  Output             3700 ml  Net           -612.5 ml   Filed Weights   06/12/2017 0110 06/29/17 0355  Weight: 134.9 kg (297 lb 6.4 oz) 133.6 kg (294 lb 8.6 oz)    Examination: General exam: Awake, appears uncomfortable, non-verbal Respiratory system: increased respiratory effort, coarse breath  sounds Cardiovascular system: regular rate, s1, s2 Gastrointestinal system: Soft, nondistended, positive BS Central nervous system: CN2-12 grossly intact, no tremors Extremities: Perfused, no clubbing Skin: Normal skin turgor, no notable skin lesions seen Psychiatry: unable to assess given acute illness   Data Reviewed: I have personally reviewed following labs and imaging studies  CBC:  Recent Labs Lab 06/26/17 0523 06/27/17 0349 06/28/17 1108 06/29/17 0559 06/30/17 0307 07/01/17 0250 07-11-2017 0756  WBC 15.2* 16.0* 18.4* 15.1* 27.3* 27.5* 34.7*  NEUTROABS 13.6* 13.3*  --  12.4*  --   --   --   HGB 11.7* 10.9* 13.7 13.0 12.7* 11.2* 11.3*  HCT 34.8* 33.3* 40.9 39.7 38.0* 34.6* 36.2*  MCV 87.2 88.6 87.0 86.9 87.8 88.9 90.5  PLT 180 199 271 260 244 282 315   Basic Metabolic Panel:  Recent Labs Lab 06/27/17 0349  06/29/17 0559 06/30/17 0307 07/01/17 0250 07/01/17 1126 07/01/17 1742 07-11-2017 0310  NA 140  < > 145 146* 154* 156* 157* 159*  K 3.6  < > 3.6 3.3* 3.1* 2.7* 2.6* 3.1*  CL 112*  < > 113* 112* 117* 118* 118* 116*  CO2 20*  < > 23 21* 27 30 30  33*  GLUCOSE 158*  < > 154* 179* 164* 202* 171* 196*  BUN 48*  < > 48* 59* 69* 66* 60* 54*  CREATININE 1.84*  < > 1.53* 2.13* 2.12* 1.92* 1.76* 1.63*  CALCIUM 7.6*  < > 8.3* 8.2* 7.5* 7.6* 7.6* 7.3*  MG  --   --  2.3  --   --  2.9*  --  3.0*  PHOS 2.9  --   --   --   --   --   --  2.6  < > = values in this interval not displayed. GFR: Estimated Creatinine Clearance: 58.8 mL/min (A) (by C-G formula based on SCr of 1.63 mg/dL (H)). Liver Function Tests:  Recent Labs Lab 06/27/17 0349  ALBUMIN 2.3*   No results for input(s): LIPASE, AMYLASE in the last 168 hours. No results for input(s): AMMONIA in the last 168 hours. Coagulation Profile: No results for input(s): INR, PROTIME in the last 168 hours. Cardiac Enzymes: No results for input(s): CKTOTAL, CKMB, CKMBINDEX, TROPONINI in the last 168 hours. BNP (last 3  results) No results for input(s): PROBNP in the last 8760 hours. HbA1C: No results for input(s): HGBA1C in the last 72 hours. CBG: No results for input(s): GLUCAP in the last 168 hours. Lipid Profile: No results for input(s): CHOL, HDL, LDLCALC, TRIG, CHOLHDL, LDLDIRECT in the last 72 hours. Thyroid Function Tests: No results for input(s): TSH, T4TOTAL, FREET4, T3FREE, THYROIDAB in the last 72 hours. Anemia Panel: No results for input(s): VITAMINB12, FOLATE, FERRITIN, TIBC, IRON, RETICCTPCT in the last 72 hours. Sepsis Labs:  Recent Labs Lab 07-22-2017 0944  PROCALCITON 2.26  LATICACIDVEN 2.8*    Recent Results (from the past 240 hour(s))  Culture, Urine     Status: Abnormal   Collection Time: 06/23/2017  3:40 AM  Result Value Ref Range Status   Specimen Description URINE, RANDOM  Final   Special Requests NONE  Final   Culture 50,000 COLONIES/mL PROTEUS MIRABILIS (A)  Final   Report Status 06/27/2017 FINAL  Final   Organism ID, Bacteria PROTEUS MIRABILIS (A)  Final      Susceptibility   Proteus mirabilis - MIC*    AMPICILLIN <=2 SENSITIVE Sensitive     CEFAZOLIN <=4 SENSITIVE Sensitive     CEFTRIAXONE <=1 SENSITIVE Sensitive     CIPROFLOXACIN <=0.25 SENSITIVE Sensitive     GENTAMICIN <=1 SENSITIVE Sensitive     IMIPENEM 0.5 SENSITIVE Sensitive     NITROFURANTOIN >=512 RESISTANT Resistant     TRIMETH/SULFA <=20 SENSITIVE Sensitive     AMPICILLIN/SULBACTAM <=2 SENSITIVE Sensitive     PIP/TAZO <=4 SENSITIVE Sensitive     * 50,000 COLONIES/mL PROTEUS MIRABILIS  Surgical pcr screen     Status: None   Collection Time: 06/12/2017  8:44 PM  Result Value Ref Range Status   MRSA, PCR NEGATIVE NEGATIVE Final   Staphylococcus aureus NEGATIVE NEGATIVE Final    Comment:        The Xpert SA Assay (FDA approved for NASAL specimens in patients over 68 years of age), is one component of a comprehensive surveillance program.  Test performance has been validated by [facility] for  patients greater than or equal to 90 year old. It is  not intended to diagnose infection nor to guide or monitor treatment.   Culture, Urine     Status: Abnormal   Collection Time: 06/18/2017 12:36 AM  Result Value Ref Range Status   Specimen Description URINE, CATHETERIZED  Final   Special Requests NONE  Final   Culture (A)  Final    <10,000 COLONIES/mL Performed at [facility], [place] [address], [place], [place] [address]    Report Status 06/26/2017 FINAL  Final  Culture, blood (Routine X 2) w Reflex to ID Panel     Status: None (Preliminary result)   Collection Time: 06/28/17  5:07 PM  Result Value Ref Range Status   Specimen Description BLOOD LEFT ANTECUBITAL  Final   Special Requests IN PEDIATRIC BOTTLE Blood Culture adequate volume  Final   Culture   Final    NO GROWTH 4 DAYS Performed at [facility], [place] [address] [place], [place] [address]    Report Status PENDING  Incomplete  Culture, blood (Routine X 2) w Reflex to ID Panel     Status: None (Preliminary result)   Collection Time: 06/28/17  5:21 PM  Result Value Ref Range Status   Specimen Description BLOOD LEFT HAND  Final   Special Requests   Final    BOTTLES DRAWN AEROBIC ONLY Blood Culture adequate volume   Culture   Final    NO GROWTH 4 DAYS Performed at [facility], [place] [address], [place], [place] [address]    Report Status PENDING  Incomplete  MRSA PCR Screening     Status: None   Collection Time: 06/29/17  8:00 AM  Result Value Ref Range Status   MRSA by PCR NEGATIVE NEGATIVE Final    Comment:        The GeneXpert MRSA Assay (FDA approved for NASAL specimens only), is one component of a comprehensive MRSA colonization surveillance program. It is not intended to diagnose MRSA infection nor to guide or monitor treatment for MRSA infections.      Radiology Studies: US Renal  Result Date: 07/01/2017 CLINICAL DATA:  Elevated creatinine. EXAM: RENAL / URINARY TRACT ULTRASOUND  COMPLETE COMPARISON:  CT abdomen pelvis dated June 29, 2017. FINDINGS: Right Kidney: Length: 13.8 cm. Increased echogenicity. Two simple appearing cysts are noted, the largest measuring up to 2.3 cm. No mass or hydronephrosis visualized. Left Kidney: Length: 14.4 cm. Increased echogenicity. No mass or hydronephrosis visualized. Bladder: Appears normal for degree of bladder distention. IMPRESSION: Increased echogenicity of the bilateral kidneys, as can be seen with medical renal disease. No hydronephrosis. Electronically Signed   By: Titus Dubin M.D.   On: 07/01/2017 15:01   Dg Chest Port 1 View  Result Date: 07/12/2017 CLINICAL DATA:  Respiratory failure. EXAM: PORTABLE CHEST 1 VIEW COMPARISON:  One-view chest x-ray 07/01/2017 FINDINGS: The heart size is exaggerated by low lung volumes. NG tube is in place. Interstitial and airspace disease is again seen bilaterally. Right greater than left pleural effusions are noted. Patient is rotated to the right. There is no significant interval change. IMPRESSION: 1. Cardiomegaly with bilateral interstitial and airspace disease likely reflecting edema and congestive heart failure. 2. Lobar pneumonia is still considered. Electronically Signed   By: San Morelle M.D.   On: 07-12-17 07:34   Dg Chest Port 1 View  Result Date: 07/01/2017 CLINICAL DATA:  Respiratory failure. EXAM: PORTABLE CHEST 1 VIEW COMPARISON:  06/30/2017 . FINDINGS: NG tube noted with tip below left hemidiaphragm. Cardiomegaly with pulmonary  venous congestion. Bilateral infiltrates/edema with slight worsening from prior exam. Basilar atelectasis. Tiny bilateral pleural effusions cannot be excluded. Right shoulder replacement. Surgical staples over the right chest. IMPRESSION: 1.  NG tube noted with its tip below left hemidiaphragm. 2. Cardiomegaly with bilateral pulmonary infiltrates/edema with slight worsening from prior exam. Small bilateral pleural effusions. Findings suggest CHF.  Bibasilar pneumonia cannot be excluded. Electronically Signed   By: Marcello Moores  Register   On: 07/01/2017 07:01   Dg Chest Port 1 View  Result Date: 06/30/2017 CLINICAL DATA:  Follow-up pneumonia. EXAM: PORTABLE CHEST 1 VIEW 6:04 p.m.: COMPARISON:  Portable chest x-ray earlier today 8:14 a.m., 06/29/2017 and earlier. FINDINGS: Suboptimal inspiration. Cardiac silhouette mildly enlarged, unchanged. Airspace consolidation involving the left lung, unchanged. Dense consolidation in the right lower lobe, worse than earlier today. Lungs otherwise clear. Pulmonary vascularity normal without evidence of pulmonary edema. Nasogastric tube tip projects over the expected location of the gastric fundus. IMPRESSION: 1. Stable left lung pneumonia. 2. Worsening atelectasis and/or pneumonia involving the right lower lobe since earlier today. 3. Mild cardiomegaly without pulmonary edema. Electronically Signed   By: Evangeline Dakin M.D.   On: 06/30/2017 18:19   Dg Abd Portable 1v  Result Date: 07/01/2017 CLINICAL DATA:  Ileus EXAM: PORTABLE ABDOMEN - 1 VIEW COMPARISON:  Yesterday FINDINGS: Diffuse gaseous distension of small and large bowel consistent with history of ileus. No noted progression since prior. No concerning mass effect or gas collection. Cholecystectomy clips. Nasogastric tube tip is not covered on this study, but is seen on the abdominal radiograph. IMPRESSION: Unchanged ileus pattern. Electronically Signed   By: Monte Fantasia M.D.   On: 07/01/2017 07:07    Scheduled Meds: . bisacodyl  10 mg Rectal BID  . chlorhexidine  15 mL Mouth Rinse BID  . etomidate  20 mg Intravenous Once  . insulin aspart  0-9 Units Subcutaneous Q4H  . levothyroxine  112 mcg Intravenous Daily  . lip balm  1 application Topical BID  . mouth rinse  15 mL Mouth Rinse q12n4p  . metoprolol tartrate  5 mg Intravenous Q6H  . pantoprazole (PROTONIX) IV  40 mg Intravenous Q12H  . rocuronium  80 mg Intravenous Once   Continuous  Infusions: . dextrose 125 mL/hr at 07/08/17 0745  . methocarbamol (ROBAXIN)  IV    . phenylephrine    . piperacillin-tazobactam (ZOSYN)  IV Stopped (July 08, 2017 1000)  . potassium chloride    . vancomycin Stopped (07/01/17 1700)     LOS: 8 days   Sylvia Kondracki, Orpah Melter, MD [facility] Pager [phone]  If 7PM-7AM, please contact night-coverage [email or web address] Password [email or web address] 07-08-17, 11:27 AM

## 2017-08-01 NOTE — Progress Notes (Signed)
Late entry 1045 Patient became increasingly tachypneic with paradoxical breathing, O2 sats dropped to low 80s on 55% venturi mask, increased to non-rebreather mask with no improvement in O2 saturation.  MD and respiratory notified and at bedside within minutes.  Patient intubated by MD without difficulty.  Central line placed by MD.  1155 Patient became acutely bradycardic with noticeable change in skin color, no pulse palpated.  CPR initiated, MD at bedside, Epi 1mg  given.  Return of pulse, CPR paused.  Patient remained sinus tach with PACs. 1215 Patient again became bradycardic, loss of pulse, CPR initiated.  MD and numerous other staff at bedside.  Patient coded for approximately 50 minutes with no improvement despite CPR and medication administration.  Code ended, patient remained in PEA.  Patient's father at bedside.  1310 Asystole at 1310, death pronounced by 2 RNs

## 2017-08-01 NOTE — Procedures (Signed)
Extubation Procedure Note  Patient Details:   Name: Eduardo Herman DOB: 1951/05/03 MRN: [id number]   Airway Documentation:     Evaluation  O2 sats: N/A Complications:none  Bilateral Breath Sounds: N/A  Pt expired  Martha Clan 07/09/17, 1:11 PM

## 2017-08-01 NOTE — Procedures (Signed)
Intubation Procedure Note KOBEE MEDLEN [id number] [birth year]  Procedure: Intubation Indications: Airway protection and maintenance  Procedure Details Consent: Risks of procedure as well as the alternatives and risks of each were explained to the (patient/caregiver).  Consent for procedure obtained. and Unable to obtain consent because of emergent medical necessity. Time Out: Verified patient identification, verified procedure, site/side was marked, verified correct patient position, special equipment/implants available, medications/allergies/relevent history reviewed, required imaging and test results available.  Performed  Maximum sterile technique was used including gloves and mask.  Miller size 3 Grade 1 view.  Intubated at first attempt.  ETT 7.5 secured 24 at lip  Evaluation Hemodynamic Status: BP stable throughout; O2 sats: stable throughout Patient's Current Condition: stable Complications: No apparent complications Patient did tolerate procedure well. Chest X-ray ordered to verify placement.  CXR: pending.   Vennessa Affinito 07/30/17

## 2017-08-01 NOTE — Progress Notes (Signed)
PULMONARY / CRITICAL CARE MEDICINE   Name: Eduardo Herman MRN: [id number] DOB: 06/14/1951    ADMISSION DATE:  06/22/2017 CONSULTATION DATE: 8/30  REFERRING MD:  [facility]  CHIEF COMPLAINT:  Aspiration  HISTORY OF PRESENT ILLNESS:   17 mo wm with PMH of Downs syndrome , tobacco abuse, chronic lower ext venous statis who feel 8/24 and required reverse rt shoulder replacement and rt ureteral stone extraction and stent placement by Drs. Norris and [employer]. He developed abd distension presumed ileus 8/29 and NGT was placed. He vomited early am 8/30 and developed resp distress and PCCM was called to evaluate and treat. He does not appear to need intubation at this time but it is a distinct possibility in near future.   SUBJECTIVE:  Mental status worsened. Febrile Less responsive today with agonal breathing, paradoxical breathing.  VITAL SIGNS: BP (!) 175/57   Pulse 82   Temp (!) 102.6 F (39.2 C)   Resp (!) 44   Ht 5\' 8"  (1.727 m)   Wt 294 lb 8.6 oz (133.6 kg)   SpO2 93%   BMI 44.78 kg/m   HEMODYNAMICS:    VENTILATOR SETTINGS: FiO2 (%):  [55 %] 55 %  INTAKE / OUTPUT: I/O last 3 completed shifts: In: 4936.3 [I.V.:3923.8; IV Piggyback:1012.5] Out: 4268 [Urine:2650; Emesis/NG output:3900]  PHYSICAL EXAMINATION: Gen:      Severe distress HEENT:  EOMI, sclera anicteric, Neck:     No masses; no thyromegaly Lungs:    B/L Rhonchi; normal respiratory effort CV:         Regular rate and rhythm; no murmurs Abd:      + bowel sounds; soft, non-tender; no palpable masses, no distension Ext:    No edema; adequate peripheral perfusion Skin:      Warm and dry; no rash Neuro: delirious, unresponsive  LABS:  BMET  Recent Labs Lab 07/01/17 1126 07/01/17 1742 2017-07-27 0310  NA 156* 157* 159*  K 2.7* 2.6* 3.1*  CL 118* 118* 116*  CO2 30 30 33*  BUN 66* 60* 54*  CREATININE 1.92* 1.76* 1.63*  GLUCOSE 202* 171* 196*    Electrolytes  Recent Labs Lab 06/27/17 0349   06/29/17 0559  07/01/17 1126 07/01/17 1742 Jul 27, 2017 0310  CALCIUM 7.6*  < > 8.3*  < > 7.6* 7.6* 7.3*  MG  --   --  2.3  --  2.9*  --  3.0*  PHOS 2.9  --   --   --   --   --  2.6  < > = values in this interval not displayed.  CBC  Recent Labs Lab 06/30/17 0307 07/01/17 0250 2017/07/27 0756  WBC 27.3* 27.5* 34.7*  HGB 12.7* 11.2* 11.3*  HCT 38.0* 34.6* 36.2*  PLT 244 282 289    Coag's No results for input(s): APTT, INR in the last 168 hours.  Sepsis Markers  Recent Labs Lab 27-Jul-2017 0944  LATICACIDVEN 2.8*  PROCALCITON 2.26    ABG  Recent Labs Lab 06/29/17 0140 06/30/17 0905 06/30/17 1830  PHART 7.424 7.467* 7.489*  PCO2ART 32.4 33.8 35.2  PO2ART 57.3* 96.7 94.5    Liver Enzymes  Recent Labs Lab 06/27/17 0349  ALBUMIN 2.3*    Cardiac Enzymes No results for input(s): TROPONINI, PROBNP in the last 168 hours.  Glucose No results for input(s): GLUCAP in the last 168 hours.  Imaging US Renal  Result Date: 07/01/2017 CLINICAL DATA:  Elevated creatinine. EXAM: RENAL / URINARY TRACT ULTRASOUND COMPLETE COMPARISON:  CT abdomen  pelvis dated June 29, 2017. FINDINGS: Right Kidney: Length: 13.8 cm. Increased echogenicity. Two simple appearing cysts are noted, the largest measuring up to 2.3 cm. No mass or hydronephrosis visualized. Left Kidney: Length: 14.4 cm. Increased echogenicity. No mass or hydronephrosis visualized. Bladder: Appears normal for degree of bladder distention. IMPRESSION: Increased echogenicity of the bilateral kidneys, as can be seen with medical renal disease. No hydronephrosis. Electronically Signed   By: Titus Dubin M.D.   On: 07/01/2017 15:01   Dg Chest Port 1 View  Result Date: 2017/07/28 CLINICAL DATA:  Respiratory failure. EXAM: PORTABLE CHEST 1 VIEW COMPARISON:  One-view chest x-ray 07/01/2017 FINDINGS: The heart size is exaggerated by low lung volumes. NG tube is in place. Interstitial and airspace disease is again seen bilaterally.  Right greater than left pleural effusions are noted. Patient is rotated to the right. There is no significant interval change. IMPRESSION: 1. Cardiomegaly with bilateral interstitial and airspace disease likely reflecting edema and congestive heart failure. 2. Lobar pneumonia is still considered. Electronically Signed   By: San Morelle M.D.   On: 28-Jul-2017 07:34     STUDIES:  8/31 renal us>> 8/31 2 d echo>>  CULTURES: 8/29 bc x 2>> 8/29 uc 8/29 procal>>  ANTIBIOTICS: 8/29 zoysn>>  SIGNIFICANT EVENTS: 8/24 admitted after fall at home 8/24 rt shoulder replacement(Norris) 8/24 rt ureteral stone extraction and stent placement(Manny) 8/29 developed ileus 8/30 vomited and presumed to have aspirated 8/31 creatine rising  LINES/TUBES:   DISCUSSION: 33 mo wm with PMH of Downs syndrome , tobacco abuse, chronic lower ext venous statis who feel 8/24 and required reverse rt shoulder replacement and rt ureteral stone extraction and stent placement by Drs. Norris and [employer]. He developed abd distension presumed ileus 8/29 and NGT was placed. He vomited early am 8/30 and developed resp distress and PCCM was called to evaluate and treat.     ASSESSMENT / PLAN:  PULMONARY A: Resp distress in setting of presumed aspiration 8/30 MO(294 lbs) Tobacco abuse Bilateral lower lobe consolidation noted on ct abd. Non compliant with Cpap for OSA P:   Intubate. Follow CXR, ABG Will likely need a trach as he has obesity, OSA, Downs syndrome. Decompress gastric contents Abx for asp pna. added Vanc 8/30  CARDIOVASCULAR A:  PAF Chronic lower ext venous stasis  P:  Hold Dilt May need pressors. CVL placement Echo pending  RENAL A:   ARI, worsening 8/31, note rt ureteral stent per Dr. Tresa Moore. No hydro Hypokalemia Hypernatremia P:   Monitor renal function.  Had ureteral stent placed 8/24 per Dr. Tresa Moore afer stone extraction D5 w for hypernatremia  GASTROINTESTINAL A:    Ileus per ct abd 8/29 Vomiting GIB P:   NGT to LWIS CCS consult  Protonix IV bid   HEMATOLOGIC  Recent Labs  07/01/17 0250 Jul 28, 2017 0756  HGB 11.2* 11.3*    A:   DVT protection P:  DVT protection Follow CBC  INFECTIOUS A:   8/29 presumed aspiration P:   8/29 IV Zoysn Added vanco  ENDOCRINE CBG (last 3)  No results for input(s): GLUCAP in the last 72 hours.   A:   Hypothyroidism  Hyperglycemia P:   Synthroid IV SSI coverage as needed  NEUROLOGIC A:   Downs syndrome Mental retardation Presumed encephalopathic from aspiration and acute illness  P:   RASS goal:1 Hold all sedating medications unless he gets intubated   Orthopedics  A.  rt shoulder replacement 8/24 Dr. Alma Friendly P. Per ortho, Dr. Veverly Fells  input noted.   FAMILY  - Updates: Family updated 8/30.  - Inter-disciplinary family meet or Palliative Care meeting due by:  day 7  The patient is critically ill with multiple organ system failure and requires high complexity decision making for assessment and support, frequent evaluation and titration of therapies, advanced monitoring, review of radiographic studies and interpretation of complex data.   Critical Care Time devoted to patient care services, exclusive of separately billable procedures, described in this note is 35 minutes.   Marshell Garfinkel MD [facility] Pager [phone] If no answer or after 3pm call: [phone] 10-Jul-2017, 12:57 PM Maryanna Shape PCCM Pager [phone] till 3 pm If no answer page [phone] Jul 10, 2017, 10:49 AM

## 2017-08-01 NOTE — Progress Notes (Signed)
Called to bedside as patient became bradycardic We were able to revive him initially with 1 mg epi  10 minutes later. He became bradycardic again and subsequently went into PEA and asystolic arrest. Resuscitation attempted for 50 mins with CPR, multiple rounds of epi, bicarbonate. He was started on max epinephrine and Levophed drip with no return of circulation. Code stopped at 12:54 pm His father was present during the code.   Additional Critical Care Time devoted to patient care services, exclusive of separately billable procedures, described in this note is 35 minutes.   Marshell Garfinkel MD [facility] Pager [phone] If no answer or after 3pm call: [phone] Jul 24, 2017, 1:04 PM

## 2017-08-01 NOTE — Progress Notes (Signed)
  Echocardiogram 2D Echocardiogram with Definity has been performed.  Tresa Res 07/12/2017, 9:14 AM

## 2017-08-01 NOTE — Procedures (Signed)
Central Venous Catheter Insertion Procedure Note Eduardo Herman [id number] 1951-08-02  Procedure: Insertion of Central Venous Catheter Indications: Drug and/or fluid administration  Procedure Details Consent: Risks of procedure as well as the alternatives and risks of each were explained to the (patient/caregiver).  Consent for procedure obtained. Time Out: Verified patient identification, verified procedure, site/side was marked, verified correct patient position, special equipment/implants available, medications/allergies/relevent history reviewed, required imaging and test results available.  Performed  Maximum sterile technique was used including antiseptics, cap, gloves, gown, hand hygiene, mask and sheet. Skin prep: Chlorhexidine; local anesthetic administered A antimicrobial bonded/coated triple lumen catheter was placed in the left internal jugular vein using the Seldinger technique.  Evaluation Blood flow good Complications: No apparent complications Patient did tolerate procedure well. Chest X-ray ordered to verify placement.  CXR: pending.  Eduardo Herman 07/04/17, 12:58 PM

## 2017-08-01 NOTE — Progress Notes (Signed)
OT Cancellation Note  Patient Details Name: Eduardo Herman MRN: [id number] DOB: February 14, 1951   Cancelled Treatment:    Reason Eval/Treat Not Completed: Medical issues which prohibited therapy. Pt having to be intubated per nursing tech. Will check back and see pt once appropriate for OT.  Jae Dire Barbarann Kelly Jul 07, 2017, 12:42 PM

## 2017-08-01 DEATH — deceased

## 2018-03-01 DEATH — deceased
